# Patient Record
Sex: Female | Born: 1935 | Race: White | Hispanic: No | Smoking: Never smoker
Health system: Southern US, Community
[De-identification: ages and names within clinical notes are randomized; demographics above are authoritative.]

## PROBLEM LIST (undated history)

## (undated) DIAGNOSIS — N189 Chronic kidney disease, unspecified: Secondary | ICD-10-CM

## (undated) DIAGNOSIS — D649 Anemia, unspecified: Secondary | ICD-10-CM

## (undated) DIAGNOSIS — E739 Lactose intolerance, unspecified: Secondary | ICD-10-CM

## (undated) DIAGNOSIS — M25519 Pain in unspecified shoulder: Secondary | ICD-10-CM

## (undated) DIAGNOSIS — R609 Edema, unspecified: Secondary | ICD-10-CM

## (undated) DIAGNOSIS — I10 Essential (primary) hypertension: Secondary | ICD-10-CM

## (undated) DIAGNOSIS — I209 Angina pectoris, unspecified: Secondary | ICD-10-CM

## (undated) DIAGNOSIS — D229 Melanocytic nevi, unspecified: Secondary | ICD-10-CM

## (undated) DIAGNOSIS — N649 Disorder of breast, unspecified: Secondary | ICD-10-CM

## (undated) DIAGNOSIS — J069 Acute upper respiratory infection, unspecified: Secondary | ICD-10-CM

## (undated) DIAGNOSIS — K635 Polyp of colon: Secondary | ICD-10-CM

## (undated) DIAGNOSIS — G4730 Sleep apnea, unspecified: Secondary | ICD-10-CM

## (undated) DIAGNOSIS — K589 Irritable bowel syndrome without diarrhea: Secondary | ICD-10-CM

## (undated) DIAGNOSIS — E039 Hypothyroidism, unspecified: Secondary | ICD-10-CM

## (undated) DIAGNOSIS — F32A Depression, unspecified: Secondary | ICD-10-CM

## (undated) DIAGNOSIS — F329 Major depressive disorder, single episode, unspecified: Secondary | ICD-10-CM

## (undated) DIAGNOSIS — T7840XA Allergy, unspecified, initial encounter: Secondary | ICD-10-CM

## (undated) DIAGNOSIS — E7800 Pure hypercholesterolemia, unspecified: Secondary | ICD-10-CM

## (undated) DIAGNOSIS — G3184 Mild cognitive impairment, so stated: Secondary | ICD-10-CM

## (undated) DIAGNOSIS — IMO0001 Reserved for inherently not codable concepts without codable children: Secondary | ICD-10-CM

## (undated) DIAGNOSIS — H3530 Unspecified macular degeneration: Secondary | ICD-10-CM

## (undated) DIAGNOSIS — F419 Anxiety disorder, unspecified: Secondary | ICD-10-CM

## (undated) DIAGNOSIS — H9190 Unspecified hearing loss, unspecified ear: Secondary | ICD-10-CM

## (undated) DIAGNOSIS — M1990 Unspecified osteoarthritis, unspecified site: Secondary | ICD-10-CM

## (undated) DIAGNOSIS — E079 Disorder of thyroid, unspecified: Secondary | ICD-10-CM

## (undated) DIAGNOSIS — K219 Gastro-esophageal reflux disease without esophagitis: Secondary | ICD-10-CM

## (undated) DIAGNOSIS — N63 Unspecified lump in unspecified breast: Secondary | ICD-10-CM

## (undated) DIAGNOSIS — L988 Other specified disorders of the skin and subcutaneous tissue: Secondary | ICD-10-CM

## (undated) DIAGNOSIS — K5792 Diverticulitis of intestine, part unspecified, without perforation or abscess without bleeding: Secondary | ICD-10-CM

## (undated) HISTORY — DX: Disorder of thyroid, unspecified: E07.9

## (undated) HISTORY — PX: HERNIA REPAIR: SHX51

## (undated) HISTORY — DX: Allergy, unspecified, initial encounter: T78.40XA

## (undated) HISTORY — DX: Unspecified macular degeneration: H35.30

## (undated) HISTORY — DX: Diverticulitis of intestine, part unspecified, without perforation or abscess without bleeding: K57.92

## (undated) HISTORY — DX: Melanocytic nevi, unspecified: D22.9

## (undated) HISTORY — PX: CHOLECYSTECTOMY: SHX55

## (undated) HISTORY — DX: Pure hypercholesterolemia, unspecified: E78.00

## (undated) HISTORY — DX: Irritable bowel syndrome, unspecified: K58.9

## (undated) HISTORY — DX: Polyp of colon: K63.5

## (undated) HISTORY — PX: BREAST SURGERY: SHX581

## (undated) HISTORY — DX: Unspecified lump in unspecified breast: N63.0

## (undated) HISTORY — PX: EYE SURGERY: SHX253

## (undated) HISTORY — PX: CATARACT EXTRACTION, BILATERAL: SHX1313

## (undated) HISTORY — PX: COLONOSCOPY: SHX174

## (undated) HISTORY — PX: ESOPHAGOGASTRODUODENOSCOPY: SHX1529

## (undated) HISTORY — PX: TUBAL LIGATION: SHX77

## (undated) HISTORY — DX: Lactose intolerance, unspecified: E73.9

---

## 1970-08-02 HISTORY — PX: TUBAL LIGATION: SHX77

## 2000-08-02 DIAGNOSIS — G3184 Mild cognitive impairment, so stated: Secondary | ICD-10-CM

## 2000-08-02 HISTORY — DX: Mild cognitive impairment of uncertain or unknown etiology: G31.84

## 2011-03-19 ENCOUNTER — Encounter (INDEPENDENT_AMBULATORY_CARE_PROVIDER_SITE_OTHER): Payer: Self-pay | Admitting: General Surgery

## 2011-03-19 ENCOUNTER — Ambulatory Visit (INDEPENDENT_AMBULATORY_CARE_PROVIDER_SITE_OTHER): Payer: 59 | Admitting: General Surgery

## 2011-03-19 VITALS — BP 156/76 | HR 80 | Temp 98.0°F | Ht 65.5 in | Wt 176.2 lb

## 2011-03-19 DIAGNOSIS — K5732 Diverticulitis of large intestine without perforation or abscess without bleeding: Secondary | ICD-10-CM

## 2011-03-19 NOTE — Progress Notes (Signed)
Subjective:   Abdominal pain and recurrent diverticulitis  Patient ID: Sophia Nash, female   DOB: 09/28/1934, 76 y.o.   MRN: [id number]  HPI The patient is a very pleasant 76 year old female referred through the courtesy of Dr. Murlean Caller at [facility]. She is here for consideration for surgical treatment for persistent and recurrent diverticulitis. She is accompanied today by her daughter and husband. She states she had her initial "attack" in March of this year. She developed sudden fairly severe pain across her lower abdomen. This was treated with outpatient antibiotics although she had quite a bit of pain for several days. Since then she has had 3 further acute episodes of abdominal pain localized in the left lower quadrant. Each time she has received at least 2 weeks of Cipro and Flagyl. She will improve on antibiotics but never feels completely well. She has ongoing fatigue and some malaise as well as vague discomfort and abdominal bloating between episodes. When off antibiotics she has a fairly quick recurrence of her more acute symptoms. She has had 2 separate CT scans which she and her daughter state confirmed sigmoid diverticulitis without apparent abscess or complication. I do not have these films to review today but they will get me the discs to review. She does have some decrease in caliber of stools and some bloating but her bowels are moving regularly her she has no previous history of significant GI disease.She does however have a history of colon polyps and had a colonoscopy in 2010 which was clear. We will obtain these records as well. She denies any current rectal bleeding. No fever or chills. Weight is stable.  Past Medical History  Diagnosis Date  . Breast lump     left  . Thyroid disease     hypothyroidism  . Atypical moles   . Colon polyp   . Allergy    Past Surgical History  Procedure Date  . Tubal ligation   . Breast surgery     lumpectomy- left-benign   . Cholecystectomy     laparoscopic   Current Outpatient Prescriptions  Medication Sig Dispense Refill  . ciprofloxacin (CIPRO) 500 MG tablet BID times 48H.      . citalopram (CELEXA) 40 MG tablet daily.      Marland Kitchen ezetimibe (ZETIA) 10 MG tablet Take 10 mg by mouth daily.        Marland Kitchen LACTOBACILLUS PO Take 100 mg by mouth daily.        Marland Kitchen LEVOXYL 75 MCG tablet daily.      . metroNIDAZOLE (FLAGYL) 500 MG tablet Three times a day.      . Multiple Vitamin (MULTIVITAMIN) capsule Take 1 capsule by mouth daily.        Marland Kitchen triamterene-hydrochlorothiazide (DYAZIDE) 37.5-25 MG per capsule Take 1 capsule by mouth daily.         Allergies  Allergen Reactions  . Penicillins Anaphylaxis  . Aspirin     Burns stomach  . Codeine     Makes pt overly excited.... Anxious/ hyperactive    History  Substance Use Topics  . Smoking status: Never Smoker   . Smokeless tobacco: Not on file  . Alcohol Use: No     Review of Systems  Constitutional: Negative for fever, chills and activity change.  HENT: Negative.   Eyes: Negative.   Respiratory: Positive for shortness of breath. Negative for cough, wheezing and stridor.   Cardiovascular: Negative.   Gastrointestinal: Positive for abdominal pain. Negative for vomiting,  constipation and blood in stool.  Genitourinary: Negative.   Musculoskeletal: Positive for arthralgias.  Skin: Negative.   Hematological: Negative.        Objective:   Physical Exam General: Mildly overweight alert elderly white female in no distress Skin: Warm and dry without rash or infection HEENT: No palpable masses or thyromegaly. Oropharynx clear. Sclera nonicteric. Lymph nodes: No cervical, supraclavicular, or inguinal nodes palpable Lungs: Clear without wheezing or increased work of breathing Cardiovascular: Regular rate and rhythm without murmur. No JVD or edema. Abdomen: Nondistended. Soft with a mild left lower quadrant tenderness without guarding. No discernible masses or  organomegaly. Extremities: No joint swelling or deformity Neurologic: Alert and oriented. Gait normal.    Assessment:     76 year old female with recurrent and essentially persistent episodes of sigmoid diverticulitis since March of this year. She has had prompt recurrence when she is off antibiotics. I think it is very unlikely that this is going to resolve completely and I think she is certainly at risk for complications going forward. I think that he elective colectomy would be her safest course.    Plan:     We discussed proceeding with laparoscopic sigmoid colectomy. We discussed the indications for the procedure and alternatives i.e. antibiotic management. The nature of the surgery, expected recovery, possible need for open surgery, and risks of anesthetic complications, bleeding, infection, anastomotic leak, and possible need for diverting ostomy were discussed and understood. I'm going to obtain copies of her CT scan discs and review these prior to surgery to confirm the above findings. In the meantime we can be moving forward arranging her surgery. I asked her to call if she has any severe recurrent symptoms. She will complete her current 3 week course of antibiotics.

## 2011-03-19 NOTE — Patient Instructions (Signed)
Call for any worsening symptoms or questions. We will contact you regarding scheduling of her surgery. Please call if he has not heard anything by the end of next week. Please obtain copies of your CT scan on disc for review.

## 2011-03-23 ENCOUNTER — Telehealth (INDEPENDENT_AMBULATORY_CARE_PROVIDER_SITE_OTHER): Payer: Self-pay | Admitting: General Surgery

## 2011-03-23 NOTE — Telephone Encounter (Signed)
Patient daughter Sophia Nash called requesting that Ms. Lafosse surgery to be at a different facility, please call Sophia Nash @ [phone].

## 2011-04-28 ENCOUNTER — Inpatient Hospital Stay (HOSPITAL_COMMUNITY): Admission: RE | Admit: 2011-04-28 | Payer: Medicare Other | Source: Ambulatory Visit | Admitting: General Surgery

## 2011-09-06 ENCOUNTER — Telehealth (INDEPENDENT_AMBULATORY_CARE_PROVIDER_SITE_OTHER): Payer: Self-pay

## 2011-09-06 NOTE — Telephone Encounter (Signed)
Pt's daughter called.  Pt is now very symptomatic(was last seen in August 2012) and her daughter would like her seen ASAP by Dr. Johna Sheriff.  Please call her tomorrow before 10:00am if possible.  She is a [employer] and is usually busy most of the day.  Her contact number is [phone].

## 2011-09-16 ENCOUNTER — Telehealth (INDEPENDENT_AMBULATORY_CARE_PROVIDER_SITE_OTHER): Payer: Self-pay

## 2011-09-16 NOTE — Telephone Encounter (Signed)
Called and spoke with Sophia Nash & Sophia Nash Section (daughter) regarding appointment w/Dr. Johna Sheriff.  Offered to work patient in on 09/23/11 but unable to come that day due to other physician appointments.  Sophia Nash is scheduled to see Dr. Johna Sheriff 09/30/11 @ 10:00 am will arrive 30 minutes prior to appointment.

## 2011-09-23 ENCOUNTER — Ambulatory Visit (INDEPENDENT_AMBULATORY_CARE_PROVIDER_SITE_OTHER): Payer: Medicare Other | Admitting: General Surgery

## 2011-09-23 ENCOUNTER — Encounter (INDEPENDENT_AMBULATORY_CARE_PROVIDER_SITE_OTHER): Payer: Self-pay | Admitting: General Surgery

## 2011-09-23 VITALS — BP 150/78 | HR 72 | Temp 97.9°F | Resp 18 | Ht 65.5 in | Wt 182.2 lb

## 2011-09-23 DIAGNOSIS — K5732 Diverticulitis of large intestine without perforation or abscess without bleeding: Secondary | ICD-10-CM | POA: Insufficient documentation

## 2011-09-23 NOTE — Progress Notes (Signed)
Subjective:   Followup diverticulitis.  Patient ID: Sophia Nash, female   DOB: 05/23/1935, 76 y.o.   MRN: [id number]  HPI Patient returns for followup of her sigmoid diverticulitis. I had seen her last summer after several repeated episodes of sigmoid diverticulitis confirmed by CT scan. She has a history of a colonoscopy in 2010 and polypectomy. We were considering surgery at that time that she decided to wait and see how she was doing. Since that time she has had 3 further episodes of clinical diverticulitis with lower abdominal pain in the left lower quadrant. She has been on courses of antibiotics. The last episode was last month and she completed a 3 week course of antibiotics recently. She continues to have some mild left lower quadrant discomfort. Bowel movements had been okay. No fever or chills. She does however feel generally run down and is tired of continually taking antibiotics.  Past Medical History  Diagnosis Date  . Breast lump     left  . Thyroid disease     hypothyroidism  . Atypical moles   . Colon polyp   . Allergy   . Diverticulitis    Past Surgical History  Procedure Date  . Tubal ligation   . Breast surgery     lumpectomy- left-benign  . Cholecystectomy     laparoscopic   Current Outpatient Prescriptions  Medication Sig Dispense Refill  . Probiotic Product (PROBIOTIC PO) Take by mouth daily.      . ciprofloxacin (CIPRO) 500 MG tablet BID times 48H.      . citalopram (CELEXA) 40 MG tablet daily.      . LACTOBACILLUS PO Take 100 mg by mouth daily.        . LEVOXYL 75 MCG tablet daily.      . metroNIDAZOLE (FLAGYL) 500 MG tablet Three times a day.      . Multiple Vitamin (MULTIVITAMIN) capsule Take 1 capsule by mouth daily.        . triamterene-hydrochlorothiazide (DYAZIDE) 37.5-25 MG per capsule Take 1 capsule by mouth daily.         Allergies  Allergen Reactions  . Penicillins Anaphylaxis  . Aspirin     Burns stomach  . Codeine     Makes pt overly  excited.... Anxious/ hyperactive    History  Substance Use Topics  . Smoking status: Never Smoker   . Smokeless tobacco: Never Used  . Alcohol Use: No   .  Review of Systems  Constitutional: Positive for fatigue. Negative for fever and chills.  Respiratory: Positive for cough. Negative for shortness of breath and wheezing.   Cardiovascular: Negative.   Gastrointestinal: Positive for abdominal pain and abdominal distention. Negative for nausea, vomiting, diarrhea, constipation, blood in stool and anal bleeding.  Genitourinary: Negative.        Objective:   Physical Exam General: Alert,Mildly overweight Caucasian female  , in no distress Skin: Warm and dry without rash or infection. Multiple seborrheic keratoses HEENT: No palpable masses or thyromegaly. Sclera nonicteric. Pupils equal round and reactive. Oropharynx clear. Lymph nodes: No cervical, supraclavicular, or inguinal nodes palpable. Lungs: Breath sounds clear and equal without increased work of breathing Cardiovascular: Regular rate and rhythm without murmur. No JVD or edema. Peripheral pulses intact. Abdomen: Nondistended. Soft with minimal left lower quadrant tenderness without guarding.. No masses palpable. No organomegaly. No palpable hernias. Extremities: No edema or joint swelling or deformity. No chronic venous stasis changes. Neurologic: Alert and fully oriented. Gait normal.       Assessment:    Persistent and recurrent sigmoid diverticulitis with multiple episodes over the past year and decreasing time between episodes. I believe she would be best served with elective colectomy in order to relieve her symptoms and avoid likely complications such as stricture or perforation. She and her family are in complete agreement. We again reviewed the surgery and risks as we have at her previous visit.    Plan:     Laparoscopic and possible open sigmoid colectomy.      

## 2011-09-23 NOTE — Progress Notes (Signed)
Addended by: Glenna Fellows T on: 09/23/2011 01:27 PM   Modules accepted: Orders

## 2011-09-27 ENCOUNTER — Telehealth (INDEPENDENT_AMBULATORY_CARE_PROVIDER_SITE_OTHER): Payer: Self-pay

## 2011-09-27 NOTE — Telephone Encounter (Signed)
Request for medical records faxed to [facility] [phone], [facility] [phone] and [facility] [phone]

## 2011-09-28 ENCOUNTER — Other Ambulatory Visit (INDEPENDENT_AMBULATORY_CARE_PROVIDER_SITE_OTHER): Payer: Self-pay | Admitting: General Surgery

## 2011-09-30 ENCOUNTER — Encounter (INDEPENDENT_AMBULATORY_CARE_PROVIDER_SITE_OTHER): Payer: Self-pay | Admitting: General Surgery

## 2011-09-30 ENCOUNTER — Encounter (INDEPENDENT_AMBULATORY_CARE_PROVIDER_SITE_OTHER): Payer: Self-pay

## 2011-10-12 ENCOUNTER — Encounter (HOSPITAL_COMMUNITY): Payer: Self-pay | Admitting: Pharmacy Technician

## 2011-10-15 ENCOUNTER — Ambulatory Visit (HOSPITAL_COMMUNITY): Payer: Medicare Other

## 2011-10-15 ENCOUNTER — Encounter (HOSPITAL_COMMUNITY): Payer: Self-pay

## 2011-10-15 ENCOUNTER — Encounter (HOSPITAL_COMMUNITY)
Admission: RE | Admit: 2011-10-15 | Discharge: 2011-10-15 | Disposition: A | Discharge status: Home / Self Care | Payer: Medicare Other | Source: Ambulatory Visit | Attending: General Surgery | Admitting: General Surgery

## 2011-10-15 ENCOUNTER — Ambulatory Visit (INDEPENDENT_AMBULATORY_CARE_PROVIDER_SITE_OTHER): Payer: Medicare Other | Admitting: General Surgery

## 2011-10-15 ENCOUNTER — Encounter (INDEPENDENT_AMBULATORY_CARE_PROVIDER_SITE_OTHER): Payer: Medicare Other | Admitting: General Surgery

## 2011-10-15 ENCOUNTER — Other Ambulatory Visit: Payer: Self-pay

## 2011-10-15 VITALS — BP 132/78 | HR 70 | Temp 98.4°F | Resp 16 | Wt 181.6 lb

## 2011-10-15 DIAGNOSIS — K5732 Diverticulitis of large intestine without perforation or abscess without bleeding: Secondary | ICD-10-CM

## 2011-10-15 HISTORY — DX: Gastro-esophageal reflux disease without esophagitis: K21.9

## 2011-10-15 HISTORY — DX: Depression, unspecified: F32.A

## 2011-10-15 HISTORY — DX: Unspecified osteoarthritis, unspecified site: M19.90

## 2011-10-15 HISTORY — DX: Acute upper respiratory infection, unspecified: J06.9

## 2011-10-15 HISTORY — DX: Edema, unspecified: R60.9

## 2011-10-15 HISTORY — DX: Mild cognitive impairment, so stated: G31.84

## 2011-10-15 HISTORY — DX: Major depressive disorder, single episode, unspecified: F32.9

## 2011-10-15 LAB — CBC
Hemoglobin: 14.6 g/dL (ref 12.0–15.0)
MCH: 31 pg (ref 26.0–34.0)
RBC: 4.71 MIL/uL (ref 3.87–5.11)

## 2011-10-15 LAB — BASIC METABOLIC PANEL
CO2: 27 mEq/L (ref 19–32)
Calcium: 9.5 mg/dL (ref 8.4–10.5)
Glucose, Bld: 97 mg/dL (ref 70–99)
Potassium: 3.8 mEq/L (ref 3.5–5.1)
Sodium: 136 mEq/L (ref 135–145)

## 2011-10-15 LAB — SURGICAL PCR SCREEN: MRSA, PCR: NEGATIVE

## 2011-10-15 NOTE — Progress Notes (Signed)
Vital signs taken at PST visit 1445

## 2011-10-15 NOTE — Patient Instructions (Signed)
20 TAMASHA LAPLANTE  10/15/2011   Your procedure is scheduled on:  10/20/11    Wednesday  Surgery 1230-1530  Report to [facility] at  1000     AM.  Call this number if you have problems the morning of surgery: [phone]     Or PST   [id number]  [facility]   Remember:   Do not eat food:After Midnight.Tuesday night or as directed by office    ? Monday night- has bowel  prep instructions  May have clear liquids: until  MIDNIGHT Tuesday NIGHT        INCREASE FLUIDS  TUESDAY  Clear liquids include soda, tea, black coffee, apple or grape juice, broth.  Take these medicines the morning of surgery with A SIP OF WATER: CELEXA, LEVOXYL STOP aspirin, antiinflammatories of herbal meds 5 days pre op  Do not wear jewelry, make-up or nail polish.  Do not wear lotions, powders, or perfumes. You may wear deodorant.  Do not shave 48 hours prior to surgery.  Do not bring valuables to the hospital.  Contacts, dentures or bridgework may not be worn into surgery.  Leave suitcase in the car. After surgery it may be brought to your room.  For patients admitted to the hospital, checkout time is 11:00 AM the day of discharge.   Patients discharged the day of surgery will not be allowed to drive home.  Name and phone number of your driver:     husband                                                                 Special Instructions: CHG Shower Use Special Wash: 1/2 bottle night before surgery and 1/2 bottle morning of surgery. REGULAR SOAP FACE AND PRIVATES              LADIES- NO SHAVING 48 HOURS BEFORE USING BETASEPT SOAP.                  Please read over the following fact sheets that you were given: MRSA Information

## 2011-10-15 NOTE — Progress Notes (Signed)
History: The patient returns for a preop check prior to planned laparoscopic sigmoid colectomy for chronic and recurrent diverticulitis. She actually had another flareup about a week ago with some persistent left lower quadrant pain. She is on oral antibiotics and not feeling a lot better but not feeling worse. No fever.  Exam: BP 132/78  Pulse 70  Temp(Src) 98.4 F (36.9 C) (Temporal)  Resp 16  Wt 181 lb 9.6 oz (82.373 kg)  Gen.: She does not appear ill. Abdomen: Is soft with minimal if any left lower quadrant tenderness and no palpable masses.  Assessment and plan: Multiply recurrent and persistent sigmoid diverticulitis. All her questions regarding the surgery were answered. Plan to proceed with laparoscopic sigmoid colectomy. She'll continue oral antibiotics until the time of surgery there

## 2011-10-20 ENCOUNTER — Encounter (HOSPITAL_COMMUNITY): Payer: Self-pay | Admitting: *Deleted

## 2011-10-20 ENCOUNTER — Encounter (HOSPITAL_COMMUNITY)
Admission: RE | Disposition: A | Discharge status: Home / Self Care | Payer: Self-pay | Source: Ambulatory Visit | Attending: General Surgery

## 2011-10-20 ENCOUNTER — Ambulatory Visit (HOSPITAL_COMMUNITY): Payer: Medicare Other | Admitting: Certified Registered Nurse Anesthetist

## 2011-10-20 ENCOUNTER — Encounter (HOSPITAL_COMMUNITY): Payer: Self-pay | Admitting: Certified Registered Nurse Anesthetist

## 2011-10-20 ENCOUNTER — Inpatient Hospital Stay (HOSPITAL_COMMUNITY)
Admission: RE | Admit: 2011-10-20 | Discharge: 2011-10-23 | DRG: 331 | Disposition: A | Discharge status: Home / Self Care | Payer: Medicare Other | Source: Ambulatory Visit | Attending: General Surgery | Admitting: General Surgery

## 2011-10-20 DIAGNOSIS — K5732 Diverticulitis of large intestine without perforation or abscess without bleeding: Secondary | ICD-10-CM

## 2011-10-20 DIAGNOSIS — E039 Hypothyroidism, unspecified: Secondary | ICD-10-CM | POA: Diagnosis present

## 2011-10-20 DIAGNOSIS — Z860100 Personal history of colon polyps, unspecified: Secondary | ICD-10-CM

## 2011-10-20 DIAGNOSIS — Z01812 Encounter for preprocedural laboratory examination: Secondary | ICD-10-CM

## 2011-10-20 HISTORY — PX: COLECTOMY: SHX59

## 2011-10-20 LAB — ABO/RH: ABO/RH(D): B NEG

## 2011-10-20 LAB — TYPE AND SCREEN: Antibody Screen: NEGATIVE

## 2011-10-20 SURGERY — COLECTOMY, SIGMOID, LAPAROSCOPIC
Anesthesia: General | Site: Abdomen | Wound class: Clean Contaminated

## 2011-10-20 MED ORDER — HEPARIN SODIUM (PORCINE) 5000 UNIT/ML IJ SOLN
5000.0000 [IU] | Freq: Three times a day (TID) | INTRAMUSCULAR | Status: DC
  Administered 2011-10-21 – 2011-10-23 (×6): 5000 [IU] via SUBCUTANEOUS
  Filled 2011-10-20 (×9): qty 1

## 2011-10-20 MED ORDER — HYDROMORPHONE HCL PF 1 MG/ML IJ SOLN: 0.2500 mg | INTRAMUSCULAR | Status: DC | PRN

## 2011-10-20 MED ORDER — BIOTENE DRY MOUTH MT LIQD
15.0000 mL | Freq: Two times a day (BID) | OROMUCOSAL | Status: DC
  Administered 2011-10-21 – 2011-10-22 (×3): 15 mL via OROMUCOSAL

## 2011-10-20 MED ORDER — CIPROFLOXACIN IN D5W 400 MG/200ML IV SOLN
400.0000 mg | INTRAVENOUS | Status: AC
  Administered 2011-10-20: 400 mg via INTRAVENOUS

## 2011-10-20 MED ORDER — FENTANYL CITRATE 0.05 MG/ML IJ SOLN
INTRAMUSCULAR | Status: DC | PRN
  Administered 2011-10-20 (×2): 25 ug via INTRAVENOUS
  Administered 2011-10-20: 100 ug via INTRAVENOUS
  Administered 2011-10-20: 50 ug via INTRAVENOUS
  Administered 2011-10-20 (×2): 25 ug via INTRAVENOUS
  Administered 2011-10-20: 100 ug via INTRAVENOUS

## 2011-10-20 MED ORDER — CIPROFLOXACIN IN D5W 400 MG/200ML IV SOLN
INTRAVENOUS | Status: AC
  Filled 2011-10-20: qty 200

## 2011-10-20 MED ORDER — ACETAMINOPHEN 10 MG/ML IV SOLN
INTRAVENOUS | Status: AC
  Filled 2011-10-20: qty 100

## 2011-10-20 MED ORDER — LACTATED RINGERS IV SOLN
INTRAVENOUS | Status: DC
  Administered 2011-10-20: 1000 mL via INTRAVENOUS

## 2011-10-20 MED ORDER — LACTATED RINGERS IV SOLN: INTRAVENOUS | Status: DC

## 2011-10-20 MED ORDER — SUCCINYLCHOLINE CHLORIDE 20 MG/ML IJ SOLN
INTRAMUSCULAR | Status: DC | PRN
  Administered 2011-10-20: 100 mg via INTRAVENOUS

## 2011-10-20 MED ORDER — LACTATED RINGERS IR SOLN
Status: DC | PRN
  Administered 2011-10-20: 1

## 2011-10-20 MED ORDER — ACETAMINOPHEN 10 MG/ML IV SOLN
INTRAVENOUS | Status: DC | PRN
  Administered 2011-10-20: 1000 mg via INTRAVENOUS

## 2011-10-20 MED ORDER — MIDAZOLAM HCL 5 MG/5ML IJ SOLN
INTRAMUSCULAR | Status: DC | PRN
  Administered 2011-10-20: 2 mg via INTRAVENOUS

## 2011-10-20 MED ORDER — ALVIMOPAN 12 MG PO CAPS
12.0000 mg | ORAL_CAPSULE | Freq: Two times a day (BID) | ORAL | Status: DC
Start: 2011-10-21 — End: 2011-10-23
  Administered 2011-10-21 – 2011-10-23 (×5): 12 mg via ORAL
  Filled 2011-10-20 (×6): qty 1

## 2011-10-20 MED ORDER — KCL IN DEXTROSE-NACL 20-5-0.45 MEQ/L-%-% IV SOLN
INTRAVENOUS | Status: DC
  Administered 2011-10-20 – 2011-10-22 (×2): via INTRAVENOUS
  Filled 2011-10-20 (×8): qty 1000

## 2011-10-20 MED ORDER — CITALOPRAM HYDROBROMIDE 20 MG PO TABS
20.0000 mg | ORAL_TABLET | Freq: Two times a day (BID) | ORAL | Status: DC
  Administered 2011-10-20 – 2011-10-23 (×6): 20 mg via ORAL
  Filled 2011-10-20 (×7): qty 1

## 2011-10-20 MED ORDER — SODIUM CHLORIDE 0.9 % IJ SOLN
INTRAMUSCULAR | Status: DC | PRN
  Administered 2011-10-20: 20 mL via INTRAVENOUS

## 2011-10-20 MED ORDER — EPHEDRINE SULFATE 50 MG/ML IJ SOLN
INTRAMUSCULAR | Status: DC | PRN
  Administered 2011-10-20 (×2): 5 mg via INTRAVENOUS
  Administered 2011-10-20: 10 mg via INTRAVENOUS
  Administered 2011-10-20: 5 mg via INTRAVENOUS
  Administered 2011-10-20: 10 mg via INTRAVENOUS

## 2011-10-20 MED ORDER — LIDOCAINE HCL 1 % IJ SOLN
INTRAMUSCULAR | Status: DC | PRN
  Administered 2011-10-20: 80 mg via INTRADERMAL

## 2011-10-20 MED ORDER — KETAMINE HCL 10 MG/ML IJ SOLN
INTRAMUSCULAR | Status: DC | PRN
  Administered 2011-10-20 (×2): 5 mg via INTRAVENOUS

## 2011-10-20 MED ORDER — ONDANSETRON HCL 4 MG/2ML IJ SOLN: 4.0000 mg | Freq: Four times a day (QID) | INTRAMUSCULAR | Status: DC | PRN

## 2011-10-20 MED ORDER — CHLORHEXIDINE GLUCONATE 0.12 % MT SOLN
15.0000 mL | Freq: Two times a day (BID) | OROMUCOSAL | Status: DC
  Administered 2011-10-21 – 2011-10-22 (×3): 15 mL via OROMUCOSAL
  Filled 2011-10-20 (×7): qty 15

## 2011-10-20 MED ORDER — CISATRACURIUM BESYLATE 2 MG/ML IV SOLN
INTRAVENOUS | Status: DC | PRN
  Administered 2011-10-20: 1 mg via INTRAVENOUS
  Administered 2011-10-20: 10 mg via INTRAVENOUS
  Administered 2011-10-20: 6 mg via INTRAVENOUS
  Administered 2011-10-20: 1 mg via INTRAVENOUS
  Administered 2011-10-20: 4 mg via INTRAVENOUS

## 2011-10-20 MED ORDER — METRONIDAZOLE IN NACL 5-0.79 MG/ML-% IV SOLN
500.0000 mg | Freq: Once | INTRAVENOUS | Status: AC
  Administered 2011-10-20: 500 mg via INTRAVENOUS

## 2011-10-20 MED ORDER — HYDROMORPHONE HCL PF 1 MG/ML IJ SOLN
INTRAMUSCULAR | Status: DC | PRN
  Administered 2011-10-20 (×2): 0.5 mg via INTRAVENOUS
  Administered 2011-10-20: 1 mg via INTRAVENOUS

## 2011-10-20 MED ORDER — ALVIMOPAN 12 MG PO CAPS
ORAL_CAPSULE | ORAL | Status: AC
  Filled 2011-10-20: qty 1

## 2011-10-20 MED ORDER — METRONIDAZOLE IN NACL 5-0.79 MG/ML-% IV SOLN
INTRAVENOUS | Status: AC
  Filled 2011-10-20: qty 100

## 2011-10-20 MED ORDER — GLYCOPYRROLATE 0.2 MG/ML IJ SOLN
INTRAMUSCULAR | Status: DC | PRN
  Administered 2011-10-20: .4 mg via INTRAVENOUS

## 2011-10-20 MED ORDER — NEOSTIGMINE METHYLSULFATE 1 MG/ML IJ SOLN
INTRAMUSCULAR | Status: DC | PRN
  Administered 2011-10-20: 3 mg via INTRAVENOUS

## 2011-10-20 MED ORDER — ONDANSETRON HCL 4 MG/2ML IJ SOLN
INTRAMUSCULAR | Status: DC | PRN
  Administered 2011-10-20 (×3): 1 mg via INTRAVENOUS

## 2011-10-20 MED ORDER — LACTATED RINGERS IV SOLN
INTRAVENOUS | Status: DC | PRN
  Administered 2011-10-20 (×3): via INTRAVENOUS

## 2011-10-20 MED ORDER — MORPHINE SULFATE 2 MG/ML IJ SOLN
2.0000 mg | INTRAMUSCULAR | Status: DC | PRN
  Administered 2011-10-20 – 2011-10-21 (×5): 2 mg via INTRAVENOUS
  Filled 2011-10-20 (×5): qty 1

## 2011-10-20 MED ORDER — 0.9 % SODIUM CHLORIDE (POUR BTL) OPTIME
TOPICAL | Status: DC | PRN
  Administered 2011-10-20: 1000 mL

## 2011-10-20 MED ORDER — BUPIVACAINE LIPOSOME 1.3 % IJ SUSP
20.0000 mL | INTRAMUSCULAR | Status: AC
  Administered 2011-10-20: 20 mL
  Filled 2011-10-20: qty 20

## 2011-10-20 MED ORDER — OXYCODONE-ACETAMINOPHEN 5-325 MG PO TABS
1.0000 | ORAL_TABLET | ORAL | Status: DC | PRN
  Filled 2011-10-20: qty 1

## 2011-10-20 MED ORDER — ALVIMOPAN 12 MG PO CAPS
12.0000 mg | ORAL_CAPSULE | Freq: Once | ORAL | Status: AC
  Administered 2011-10-20: 12 mg via ORAL

## 2011-10-20 MED ORDER — ONDANSETRON HCL 4 MG PO TABS: 4.0000 mg | ORAL_TABLET | Freq: Four times a day (QID) | ORAL | Status: DC | PRN

## 2011-10-20 MED ORDER — PROPOFOL 10 MG/ML IV BOLUS
INTRAVENOUS | Status: DC | PRN
  Administered 2011-10-20: 20 mg via INTRAVENOUS
  Administered 2011-10-20: 150 mg via INTRAVENOUS

## 2011-10-20 MED ORDER — LEVOTHYROXINE SODIUM 75 MCG PO TABS
75.0000 ug | ORAL_TABLET | Freq: Every day | ORAL | Status: DC
  Administered 2011-10-21 – 2011-10-23 (×3): 75 ug via ORAL
  Filled 2011-10-20 (×3): qty 1

## 2011-10-20 SURGICAL SUPPLY — 76 items
APPLIER CLIP 5 13 M/L LIGAMAX5 (MISCELLANEOUS)
APPLIER CLIP ROT 10 11.4 M/L (STAPLE)
BLADE HEX COATED 2.75 (ELECTRODE) ×2 IMPLANT
BLADE SURG SZ10 CARB STEEL (BLADE) ×2 IMPLANT
CELLS DAT CNTRL 66122 CELL SVR (MISCELLANEOUS) ×1 IMPLANT
CHLORAPREP W/TINT 26ML (MISCELLANEOUS) ×2 IMPLANT
CLIP APPLIE 5 13 M/L LIGAMAX5 (MISCELLANEOUS) IMPLANT
CLIP APPLIE ROT 10 11.4 M/L (STAPLE) IMPLANT
CLOTH BEACON ORANGE TIMEOUT ST (SAFETY) ×2 IMPLANT
COVER MAYO STAND STRL (DRAPES) ×2 IMPLANT
COVER SURGICAL LIGHT HANDLE (MISCELLANEOUS) ×2 IMPLANT
CUTTER FLEX LINEAR 45M (STAPLE) ×2 IMPLANT
DERMABOND ADVANCED (GAUZE/BANDAGES/DRESSINGS) ×1
DERMABOND ADVANCED .7 DNX12 (GAUZE/BANDAGES/DRESSINGS) ×1 IMPLANT
DRAPE CAMERA CLOSED 9X96 (DRAPES) IMPLANT
DRAPE LAPAROSCOPIC ABDOMINAL (DRAPES) ×2 IMPLANT
DRAPE LG THREE QUARTER DISP (DRAPES) ×2 IMPLANT
DRAPE WARM FLUID 44X44 (DRAPE) ×2 IMPLANT
ELECT REM PT RETURN 9FT ADLT (ELECTROSURGICAL) ×2
ELECTRODE REM PT RTRN 9FT ADLT (ELECTROSURGICAL) ×1 IMPLANT
ENDOLOOP SUT PDS II  0 18 (SUTURE)
ENDOLOOP SUT PDS II 0 18 (SUTURE) IMPLANT
FILTER SMOKE EVAC LAPAROSHD (FILTER) ×2 IMPLANT
GOWN STRL NON-REIN LRG LVL3 (GOWN DISPOSABLE) ×4 IMPLANT
GOWN STRL REIN XL XLG (GOWN DISPOSABLE) ×4 IMPLANT
KIT BASIN OR (CUSTOM PROCEDURE TRAY) ×2 IMPLANT
LEGGING LITHOTOMY PAIR STRL (DRAPES) ×2 IMPLANT
LIGASURE IMPACT 36 18CM CVD LR (INSTRUMENTS) IMPLANT
NS IRRIG 1000ML POUR BTL (IV SOLUTION) ×4 IMPLANT
PEN SKIN MARKING BROAD (MISCELLANEOUS) ×2 IMPLANT
PENCIL BUTTON HOLSTER BLD 10FT (ELECTRODE) ×2 IMPLANT
RELOAD 45 VASCULAR/THIN (ENDOMECHANICALS) ×4 IMPLANT
RELOAD STAPLE TA45 3.5 REG BLU (ENDOMECHANICALS) ×6 IMPLANT
RTRCTR WOUND ALEXIS 18CM MED (MISCELLANEOUS) ×2
SCALPEL HARMONIC ACE (MISCELLANEOUS) ×2 IMPLANT
SCISSORS LAP 5X35 DISP (ENDOMECHANICALS) ×2 IMPLANT
SEALER TISSUE G2 CVD JAW 35 (ENDOMECHANICALS) IMPLANT
SEALER TISSUE G2 CVD JAW 45CM (ENDOMECHANICALS)
SET IRRIG TUBING LAPAROSCOPIC (IRRIGATION / IRRIGATOR) ×2 IMPLANT
SLEEVE ADV FIXATION 12X100MM (TROCAR) IMPLANT
SLEEVE ADV FIXATION 5X100MM (TROCAR) ×6 IMPLANT
SLEEVE Z-THREAD 12X100MM (TROCAR) IMPLANT
SOLUTION ANTI FOG 6CC (MISCELLANEOUS) ×2 IMPLANT
SPONGE GAUZE 4X4 12PLY (GAUZE/BANDAGES/DRESSINGS) ×2 IMPLANT
SPONGE LAP 18X18 X RAY DECT (DISPOSABLE) ×2 IMPLANT
STAPLER CIRC CVD 29MM 37CM (STAPLE) ×2 IMPLANT
STAPLER VISISTAT 35W (STAPLE) ×2 IMPLANT
SUCTION POOLE TIP (SUCTIONS) ×2 IMPLANT
SUT MNCRL AB 4-0 PS2 18 (SUTURE) ×4 IMPLANT
SUT PDS AB 0 CT1 36 (SUTURE) ×4 IMPLANT
SUT PDS AB 1 CT1 27 (SUTURE) IMPLANT
SUT PDS AB 1 CTX 36 (SUTURE) IMPLANT
SUT PDS AB 1 TP1 96 (SUTURE) IMPLANT
SUT PROLENE 2 0 KS (SUTURE) ×2 IMPLANT
SUT SILK 2 0 (SUTURE) ×1
SUT SILK 2 0 SH CR/8 (SUTURE) ×2 IMPLANT
SUT SILK 2-0 18XBRD TIE 12 (SUTURE) ×1 IMPLANT
SUT SILK 3 0 (SUTURE) ×1
SUT SILK 3 0 SH CR/8 (SUTURE) ×2 IMPLANT
SUT SILK 3-0 18XBRD TIE 12 (SUTURE) ×1 IMPLANT
SYR BULB IRRIGATION 50ML (SYRINGE) ×2 IMPLANT
SYS LAPSCP GELPORT 120MM (MISCELLANEOUS)
SYSTEM LAPSCP GELPORT 120MM (MISCELLANEOUS) IMPLANT
TOWEL OR 17X26 10 PK STRL BLUE (TOWEL DISPOSABLE) ×4 IMPLANT
TRAY FOLEY CATH 14FRSI W/METER (CATHETERS) ×2 IMPLANT
TRAY LAP CHOLE (CUSTOM PROCEDURE TRAY) ×2 IMPLANT
TROCAR ADV FIXATION 11X100MM (TROCAR) IMPLANT
TROCAR ADV FIXATION 12X100MM (TROCAR) IMPLANT
TROCAR ADV FIXATION 5X100MM (TROCAR) IMPLANT
TROCAR BLADELESS OPT 5 75 (ENDOMECHANICALS) IMPLANT
TROCAR XCEL BLUNT TIP 100MML (ENDOMECHANICALS) ×2 IMPLANT
TROCAR XCEL NON-BLD 11X100MML (ENDOMECHANICALS) IMPLANT
TROCAR Z-THREAD FIOS 12X100MM (TROCAR) ×2 IMPLANT
TUBING INSUFFLATION 10FT LAP (TUBING) ×2 IMPLANT
YANKAUER SUCT BULB TIP 10FT TU (MISCELLANEOUS) ×2 IMPLANT
YANKAUER SUCT BULB TIP NO VENT (SUCTIONS) ×2 IMPLANT

## 2011-10-20 NOTE — Anesthesia Preprocedure Evaluation (Addendum)
Anesthesia Evaluation  Patient identified by MRN, date of birth, ID band Patient awake    Reviewed: Allergy & Precautions, H&P , NPO status , Patient's Chart, lab work & pertinent test results  Airway Mallampati: II TM Distance: >3 FB Neck ROM: full    Dental No notable dental hx. (+) Teeth Intact and Dental Advisory Given   Pulmonary neg pulmonary ROS, Recent URI ,  breath sounds clear to auscultation  Pulmonary exam normal       Cardiovascular Exercise Tolerance: Good negative cardio ROS  Rhythm:regular Rate:Normal     Neuro/Psych Depression negative neurological ROS  negative psych ROS   GI/Hepatic negative GI ROS, Neg liver ROS, GERD-  ,  Endo/Other  negative endocrine ROSHypothyroidism   Renal/GU negative Renal ROS  negative genitourinary   Musculoskeletal   Abdominal   Peds  Hematology negative hematology ROS (+)   Anesthesia Other Findings   Reproductive/Obstetrics negative OB ROS                          Anesthesia Physical Anesthesia Plan  ASA: II  Anesthesia Plan: General   Post-op Pain Management:    Induction: Intravenous  Airway Management Planned: Oral ETT  Additional Equipment:   Intra-op Plan:   Post-operative Plan: Extubation in OR  Informed Consent: I have reviewed the patients History and Physical, chart, labs and discussed the procedure including the risks, benefits and alternatives for the proposed anesthesia with the patient or authorized representative who has indicated his/her understanding and acceptance.   Dental Advisory Given  Plan Discussed with: CRNA and Surgeon  Anesthesia Plan Comments:         Anesthesia Quick Evaluation

## 2011-10-20 NOTE — Transfer of Care (Signed)
Immediate Anesthesia Transfer of Care Note  Patient: Sophia Nash  Procedure(s) Performed: Procedure(s) (LRB): LAPAROSCOPIC SIGMOID COLECTOMY (N/A)  Patient Location: PACU  Anesthesia Type: General  Level of Consciousness: awake and alert   Airway & Oxygen Therapy: Patient Spontanous Breathing and Patient connected to face mask oxygen  Post-op Assessment: Report given to PACU RN, Post -op Vital signs reviewed and stable and Patient moving all extremities  Post vital signs: Reviewed and stable  Complications: No apparent anesthesia complications

## 2011-10-20 NOTE — Op Note (Signed)
Preoperative Diagnosis: diverticulitis  Postoprative Diagnosis: diverticulitis  Procedure: Procedure(s): LAPAROSCOPIC SIGMOID COLECTOMY  With takedown of splenic flexure   Surgeon: Glenna Fellows T   Assistants: Harriette Bouillon M.D.  Anesthesia:  General endotracheal anesthesiaDiagnos  Indications:  Patient is a 77 year old female who several years of gradually worsening episodic sigmoid diverticulitis documented on several studies including CT and colonoscopy. She presents with increasingly severe and frequent episodes were now she has been on antibiotics almost continually in recent months. Due to worsening and persistent symptoms unresponsive to medical management after consultation we have elected to proceed with laparoscopic sigmoid colectomy. We discussed the indications for the procedure, its nature, and risks of anesthetic complications, bleeding, infection, visceral injury, anastomotic leak and possible need for temporary ostomy. Following a mechanical bowel prep at home she is brought to the operating room for this procedure.   Procedure Detail: Patient is brought to the operating room, placed in the supine position on the operating table and general endotracheal anesthesia induced. She had received broad-spectrum preoperative IV antibiotics. PAS were in place. She was carefully positioned in a semi-lithotomy position in yellowfin stirrups and arms tucked carefully padded. Foley catheter was placed. The abdomen was widely sterilely prepped and draped. The perineum was sterilely prepped. Patient timeout was performed the correct procedure verified. Access was obtained with a 1 cm incision just below the umbilicus in the midline with an open Hassan technique through mattress suture of 0 Vicryl without difficulty and pneumoperitoneum established. Under direct vision a 12 mm trocar was placed in the right lower quadrant just medial and above the anterior superior iliac spine and a 5 mm  trochars in a similar position in the left lower quadrant in the right upper abdomen and left upper abdomen. Exploration revealed very significant sigmoid diverticulitis with numerous adhesions between the sigmoid colon and the uterus and left adnexa and pelvic side wall. Loops of small bowel were adherent with filmy adhesions down into the pelvis and the colon and these were all mobilized up out of the pelvis and the patient placed in steep reverse Trendelenburg. Adhesions of the colon to the uterus and left adnexa were carefully taken down. The distal rectosigmoid and rectum appeared normal. The more proximal left colon appeared normal. I initially approached the mesentery of the sigmoid colon medially incising the peritoneum and attempted to develop the retroperitoneal plane from medial to lateral. The mesentery however was quite thick unit at this level and chronically inflamed and I was not comfortable with the approach. I therefore mobilized the sigmoid colon from lateral to medial dividing lateral peritoneal attachments and reflecting the sigmoid colon medially. With careful blunt dissection we were able to clearly identify the ureter throughout its course down over the pelvic brim and back under the sigmoid mesentery. At this point again working medial to lateral I isolated the inferior mesenteric pedicle clearly above the visualized ureter and this was divided with the GIA vascular load stapler. We then pulled the rectosigmoid up out of the pelvis and identified a point for distal resection at just about the peritoneal reflection where the bowel was soft and normal and no tinea. The peritoneum on either side of the rectosigmoid was incised and with careful blunt dissection a window was developed along the bowel wall posteriorly and through to the right side. This window was further developed and then the rectosigmoid was divided with 2 firings of the Endo GIA 45 mm blue load stapler. The distal end of the  specimen was  then elevated and retracted cephalad and the mesentery of the distal sigmoid was sequentially divided with the harmonic scalpel again having the ureter visualized and protected throughout. We continued this mesenteric dissection proximally until I entered the window created from division of the inferior mesenteric pedicle. We then extensively mobilized the left colon dividing lateral peritoneal attachments and reflecting the colon medially. The patient placed in reverse Trendelenburg the omentum was dissected off the distal transverse colon and the splenocolic ligament identified and divided with the harmonic scalpel and the splenic flexure completely mobilized dissecting the mesentery of both introitus fascia and then we came back down the left colon further mobilizing it medially. After full mobilization we made a small about 5 cm incision in the left lower quadrant at the position of the previously placed port and a muscle-splitting incision was used and the peritoneal cavity entered. End of the specimen was brought out with plenty of length and we selected an area of the left colon which was soft and without muscular thickening and reach down easily to the pubis. This area was cleaned of mesentery divided between clamps and tie with 2 silk ties and the bowel wall cleared. A 2-0 Prolene pursestring suture was placed with the pursestring clamp and the bowel divided and the specimen removed. The end of the bowel was sized to a 29 mm stapler and the anvil was placed in the end of the pursestring suture secured. Grasping the post of the anvil through one of the 5 mm lateral trochars it was reintroduced into the peritoneal cavity and the wound protector sealed and pneumoperitoneum reestablished. Dr. Luisa Hart then went below and was able to pass the 29 mm EEA stapler transanally up to the stapled end of the rectosigmoid without difficulty. The spike was deployed just anterior to the middle of the staple  line and the anvil attached. Excluding any extraneous tissue the stapler was closed and then fired and withdrawn without difficulty. Both tissue donuts were intact. Occluding the colon above the anastomosis but Cornett performed rigid sigmoidoscopy and with the bowel densely insufflated under saline irrigation there was no evidence of leak. The abdomen was thoroughly irrigated and there was no evidence of bleeding or visceral injury or other problems. All CO2 was evacuated and trochars removed. The mattress suture with secured at the umbilicus. The left lower quadrant incision was closed in 2 layers with running 0 PDS. The fashion soft tissue was infiltrated with Exparel local anesthetic. Wounds were irrigated and skin closed with subcuticular Monocryl and Dermabond. Sponge needle instrument counts were correct.   Findings:  severe sigmoid diverticulitis, chronic   Estimated Blood Loss:  200 mL         Drains: none  Blood Given: none          Specimens: sigmoid colon        Complications:  * No complications entered in OR log *         Disposition: PACU - hemodynamically stable.         Condition: stable  Mariella Saa MD, FACS  10/20/2011, 4:58 PM

## 2011-10-20 NOTE — Anesthesia Postprocedure Evaluation (Signed)
  Anesthesia Post-op Note  Patient: Sophia Nash  Procedure(s) Performed: Procedure(s) (LRB): LAPAROSCOPIC SIGMOID COLECTOMY (N/A)  Patient Location: PACU  Anesthesia Type: General  Level of Consciousness: awake and alert   Airway and Oxygen Therapy: Patient Spontanous Breathing  Post-op Pain: mild  Post-op Assessment: Post-op Vital signs reviewed, Patient's Cardiovascular Status Stable, Respiratory Function Stable, Patent Airway and No signs of Nausea or vomiting  Post-op Vital Signs: stable  Complications: No apparent anesthesia complications

## 2011-10-20 NOTE — H&P (View-Only) (Signed)
Subjective:   Followup diverticulitis.  Patient ID: Sophia Nash, female   DOB: 18-Jan-1935, 77 y.o.   MRN: [id number]  HPI Patient returns for followup of her sigmoid diverticulitis. I had seen her last summer after several repeated episodes of sigmoid diverticulitis confirmed by CT scan. She has a history of a colonoscopy in 2010 and polypectomy. We were considering surgery at that time that she decided to wait and see how she was doing. Since that time she has had 3 further episodes of clinical diverticulitis with lower abdominal pain in the left lower quadrant. She has been on courses of antibiotics. The last episode was last month and she completed a 3 week course of antibiotics recently. She continues to have some mild left lower quadrant discomfort. Bowel movements had been okay. No fever or chills. She does however feel generally run down and is tired of continually taking antibiotics.  Past Medical History  Diagnosis Date  . Breast lump     left  . Thyroid disease     hypothyroidism  . Atypical moles   . Colon polyp   . Allergy   . Diverticulitis    Past Surgical History  Procedure Date  . Tubal ligation   . Breast surgery     lumpectomy- left-benign  . Cholecystectomy     laparoscopic   Current Outpatient Prescriptions  Medication Sig Dispense Refill  . Probiotic Product (PROBIOTIC PO) Take by mouth daily.      . ciprofloxacin (CIPRO) 500 MG tablet BID times 48H.      . citalopram (CELEXA) 40 MG tablet daily.      Marland Kitchen LACTOBACILLUS PO Take 100 mg by mouth daily.        Marland Kitchen LEVOXYL 75 MCG tablet daily.      . metroNIDAZOLE (FLAGYL) 500 MG tablet Three times a day.      . Multiple Vitamin (MULTIVITAMIN) capsule Take 1 capsule by mouth daily.        Marland Kitchen triamterene-hydrochlorothiazide (DYAZIDE) 37.5-25 MG per capsule Take 1 capsule by mouth daily.         Allergies  Allergen Reactions  . Penicillins Anaphylaxis  . Aspirin     Burns stomach  . Codeine     Makes pt overly  excited.... Anxious/ hyperactive    History  Substance Use Topics  . Smoking status: Never Smoker   . Smokeless tobacco: Never Used  . Alcohol Use: No   .  Review of Systems  Constitutional: Positive for fatigue. Negative for fever and chills.  Respiratory: Positive for cough. Negative for shortness of breath and wheezing.   Cardiovascular: Negative.   Gastrointestinal: Positive for abdominal pain and abdominal distention. Negative for nausea, vomiting, diarrhea, constipation, blood in stool and anal bleeding.  Genitourinary: Negative.        Objective:   Physical Exam General: Alert,Mildly overweight Caucasian female  , in no distress Skin: Warm and dry without rash or infection. Multiple seborrheic keratoses HEENT: No palpable masses or thyromegaly. Sclera nonicteric. Pupils equal round and reactive. Oropharynx clear. Lymph nodes: No cervical, supraclavicular, or inguinal nodes palpable. Lungs: Breath sounds clear and equal without increased work of breathing Cardiovascular: Regular rate and rhythm without murmur. No JVD or edema. Peripheral pulses intact. Abdomen: Nondistended. Soft with minimal left lower quadrant tenderness without guarding.. No masses palpable. No organomegaly. No palpable hernias. Extremities: No edema or joint swelling or deformity. No chronic venous stasis changes. Neurologic: Alert and fully oriented. Gait normal.  Assessment:    Persistent and recurrent sigmoid diverticulitis with multiple episodes over the past year and decreasing time between episodes. I believe she would be best served with elective colectomy in order to relieve her symptoms and avoid likely complications such as stricture or perforation. She and her family are in complete agreement. We again reviewed the surgery and risks as we have at her previous visit.    Plan:     Laparoscopic and possible open sigmoid colectomy.

## 2011-10-20 NOTE — Anesthesia Procedure Notes (Signed)
Procedure Name: Intubation Date/Time: 10/20/2011 12:28 PM Performed by: Hulan Fess Pre-anesthesia Checklist: Patient identified, Emergency Drugs available, Suction available and Timeout performed Patient Re-evaluated:Patient Re-evaluated prior to inductionOxygen Delivery Method: Circle system utilized Preoxygenation: Pre-oxygenation with 100% oxygen Intubation Type: IV induction Ventilation: Mask ventilation without difficulty Laryngoscope Size: Mac and 3 Grade View: Grade I Tube type: Oral Tube size: 8.0 mm Number of attempts: 1 Placement Confirmation: ETT inserted through vocal cords under direct vision and breath sounds checked- equal and bilateral Secured at: 22 cm Tube secured with: Tape Dental Injury: Teeth and Oropharynx as per pre-operative assessment

## 2011-10-20 NOTE — Interval H&P Note (Signed)
History and Physical Interval Note:  10/20/2011 12:00 PM  Sophia Nash  has presented today for surgery, with the diagnosis of diverticulitis  The various methods of treatment have been discussed with the patient and family. After consideration of risks, benefits and other options for treatment, the patient has consented to  Procedure(s) (LRB): LAPAROSCOPIC SIGMOID COLECTOMY (N/A) as a surgical intervention .  The patients' history has been reviewed, patient examined, no change in status, stable for surgery.  I have reviewed the patients' chart and labs.  Questions were answered to the patient's satisfaction.     Senna Lape T

## 2011-10-21 ENCOUNTER — Encounter (INDEPENDENT_AMBULATORY_CARE_PROVIDER_SITE_OTHER): Payer: Medicare Other | Admitting: General Surgery

## 2011-10-21 LAB — BASIC METABOLIC PANEL
BUN: 6 mg/dL (ref 6–23)
Chloride: 104 mEq/L (ref 96–112)
Creatinine, Ser: 0.78 mg/dL (ref 0.50–1.10)
GFR calc Af Amer: >90 mL/min (ref >90)
GFR calc non Af Amer: 80 mL/min — ABNORMAL LOW (ref >90)
Glucose, Bld: 149 mg/dL — ABNORMAL HIGH (ref 70–99)

## 2011-10-21 LAB — CBC
HCT: 35.4 % — ABNORMAL LOW (ref 36.0–46.0)
MCH: 30.8 pg (ref 26.0–34.0)
MCHC: 33.9 g/dL (ref 30.0–36.0)
RDW: 12.6 % (ref 11.5–15.5)

## 2011-10-21 MED ORDER — TRAMADOL HCL 50 MG PO TABS
50.0000 mg | ORAL_TABLET | Freq: Four times a day (QID) | ORAL | Status: DC | PRN
  Administered 2011-10-22 (×2): 50 mg via ORAL
  Filled 2011-10-21 (×2): qty 1

## 2011-10-21 NOTE — Progress Notes (Signed)
Patient ID: Sophia Nash, female   DOB: Mar 19, 1935, 77 y.o.   MRN: [id number] 1 Day Post-Op  Subjective: Patient without complaints. Has been up out of bed and voiding. Minimal pain and denies nausea.  Objective: Vital signs in last 24 hours: Temp:  [96.4 F (35.8 C)-98.8 F (37.1 C)] 98.2 F (36.8 C) (03/21 0601) Pulse Rate:  [62-85] 62  (03/21 0601) Resp:  [10-18] 16  (03/21 0601) BP: (119-171)/(50-84) 123/60 mmHg (03/21 0601) SpO2:  [94 %-100 %] 99 % (03/21 0601) Weight:  [181 lb (82.101 kg)] 181 lb (82.101 kg) (03/20 2243)    Intake/Output from previous day: 03/20 0701 - 03/21 0700 In: 2220 [I.V.:2220] Out: 1310 [Urine:1185; Blood:100] Intake/Output this shift:    General appearance: alert and no distress GI: normal findings: soft, non-tender Incision/Wound: clean and dry  Lab Results:   [facility] 10/21/11 0434  WBC 8.0  HGB 12.0  HCT 35.4*  PLT 212   BMET  Basename 10/21/11 0434  NA 137  K 4.2  CL 104  CO2 29  GLUCOSE 149*  BUN 6  CREATININE 0.78  CALCIUM 8.1*     Studies/Results: No results found.  Anti-infectives: Anti-infectives     Start     Dose/Rate Route Frequency Ordered Stop   10/20/11 1000   metroNIDAZOLE (FLAGYL) IVPB 500 mg        500 mg 100 mL/hr over 60 Minutes Intravenous  Once 10/20/11 0954 10/20/11 1200   10/20/11 0954   ciprofloxacin (CIPRO) IVPB 400 mg        400 mg 200 mL/hr over 60 Minutes Intravenous 120 min pre-op 10/20/11 0954 10/20/11 1222          Assessment/Plan: s/p Procedure(s): LAPAROSCOPIC SIGMOID COLECTOMY Doing well postoperatively without complication identified. Will start clear liquid diet. Ambulate.   LOS: 1 day    Natallie Ravenscroft T 10/21/2011

## 2011-10-22 ENCOUNTER — Encounter (INDEPENDENT_AMBULATORY_CARE_PROVIDER_SITE_OTHER): Payer: Self-pay | Admitting: General Surgery

## 2011-10-22 MED ORDER — TRAMADOL HCL 50 MG PO TABS
50.0000 mg | ORAL_TABLET | ORAL | Status: DC | PRN
  Administered 2011-10-22 – 2011-10-23 (×6): 50 mg via ORAL
  Filled 2011-10-22 (×6): qty 1

## 2011-10-22 NOTE — Progress Notes (Signed)
Dr. Johna Sheriff aware via phone pt requested IVF be discontinued. Stated " I gather fluid so easily". See new order received to [facility].

## 2011-10-22 NOTE — Progress Notes (Signed)
Patient ID: Sophia Nash, female   DOB: 1935/01/29, 77 y.o.   MRN: [id number] 2 Days Post-Op  Subjective: Feels well without complaints. She is tolerating her clear liquids without nausea. She has had a couple of bowel movements. Mild pain is well-controlled with tramadol  Objective: Vital signs in last 24 hours: Temp:  [98.2 F (36.8 C)-99 F (37.2 C)] 98.3 F (36.8 C) (03/22 0554) Pulse Rate:  [64-73] 69  (03/22 0554) Resp:  [16-18] 18  (03/22 0554) BP: (120-156)/(60-83) 156/79 mmHg (03/22 0554) SpO2:  [95 %-99 %] 95 % (03/22 0554)    Intake/Output from previous day: 03/21 0701 - 03/22 0700 In: 1809.1 [I.V.:1809.1] Out: 2300 [Urine:2300] Intake/Output this shift:    General appearance: alert and no distress GI: normal findings: soft, non-tender Incision/Wound: clean and dry without signs of infection  Lab Results:   [facility] 10/21/11 0434  WBC 8.0  HGB 12.0  HCT 35.4*  PLT 212   BMET  Basename 10/21/11 0434  NA 137  K 4.2  CL 104  CO2 29  GLUCOSE 149*  BUN 6  CREATININE 0.78  CALCIUM 8.1*     Studies/Results: No results found.  Anti-infectives: Anti-infectives     Start     Dose/Rate Route Frequency Ordered Stop   10/20/11 1000   metroNIDAZOLE (FLAGYL) IVPB 500 mg        500 mg 100 mL/hr over 60 Minutes Intravenous  Once 10/20/11 0954 10/20/11 1200   10/20/11 0954   ciprofloxacin (CIPRO) IVPB 400 mg        400 mg 200 mL/hr over 60 Minutes Intravenous 120 min pre-op 10/20/11 0954 10/20/11 1222          Assessment/Plan: s/p Procedure(s): LAPAROSCOPIC SIGMOID COLECTOMY Doing very well. Will advance diet. Possibly home next one or 2 days.   LOS: 2 days    Sophia Nash T 10/22/2011

## 2011-10-22 NOTE — Discharge Instructions (Signed)
[facility]      [facility] [phone]  OPEN ABDOMINAL SURGERY: POST OP INSTRUCTIONS  Always review your discharge instruction sheet given to you by the facility where your surgery was performed.  IF YOU HAVE DISABILITY OR FAMILY LEAVE FORMS, YOU MUST BRING THEM TO THE OFFICE FOR PROCESSING.  PLEASE DO NOT GIVE THEM TO YOUR DOCTOR.  1. A prescription for pain medication may be given to you upon discharge.  Take your pain medication as prescribed, if needed.  If narcotic pain medicine is not needed, then you may take acetaminophen (Tylenol) or ibuprofen (Advil) as needed. 2. Take your usually prescribed medications unless otherwise directed. 3. If you need a refill on your pain medication, please contact your pharmacy. They will contact our office to request authorization.  Prescriptions will not be filled after 5pm or on week-ends. 4. You should follow a light diet the first few days after arrival home, such as soup and crackers, pudding, etc.unless your doctor has advised otherwise. A high-fiber, low fat diet can be resumed as tolerated.   Be sure to include lots of fluids daily. Most patients will experience some swelling and bruising on the chest and neck area.  Ice packs will help.  Swelling and bruising can take several days to resolve 5. Most patients will experience some swelling and bruising in the area of the incision. Ice pack will help. Swelling and bruising can take several days to resolve..  6. It is common to experience some constipation if taking pain medication after surgery.  Increasing fluid intake and taking a stool softener will usually help or prevent this problem from occurring.  A mild laxative (Milk of Magnesia or Miralax) should be taken according to package directions if there are no bowel movements after 48 hours. 7.  You may have steri-strips (small skin tapes) in place directly over the incision.  These strips should be left on the skin for 7-10 days.  If your  surgeon used skin glue on the incision, you may shower in 24 hours.  The glue will flake off over the next 2-3 weeks.  Any sutures or staples will be removed at the office during your follow-up visit. You may find that a light gauze bandage over your incision may keep your staples from being rubbed or pulled. You may shower and replace the bandage daily. 8. ACTIVITIES:  You may resume regular (light) daily activities beginning the next day--such as daily self-care, walking, climbing stairs--gradually increasing activities as tolerated.  You may have sexual intercourse when it is comfortable.  Refrain from any heavy lifting or straining until approved by your doctor. a. You may drive when you no longer are taking prescription pain medication, you can comfortably wear a seatbelt, and you can safely maneuver your car and apply brakes b. Return to Work: ___________________________________ 9. You should see your doctor in the office for a follow-up appointment approximately two weeks after your surgery.  Make sure that you call for this appointment within a day or two after you arrive home to insure a convenient appointment time. OTHER INSTRUCTIONS:  _____________________________________________________________ _____________________________________________________________  WHEN TO CALL YOUR DOCTOR: 1. Fever over 101.0 2. Inability to urinate 3. Nausea and/or vomiting 4. Extreme swelling or bruising 5. Continued bleeding from incision. 6. Increased pain, redness, or drainage from the incision. 7. Difficulty swallowing or breathing 8. Muscle cramping or spasms. 9. Numbness or tingling in hands or feet or around lips.  The clinic staff is available to   answer your questions during regular business hours.  Please don't hesitate to call and ask to speak to one of the nurses if you have concerns.  For further questions, please visit [email or web address]   

## 2011-10-23 MED ORDER — TRAMADOL HCL 50 MG PO TABS
50.0000 mg | ORAL_TABLET | Freq: Four times a day (QID) | ORAL | Status: AC | PRN
Start: 2011-10-23 — End: 2011-11-02

## 2011-10-23 NOTE — Progress Notes (Signed)
Assessment unchanged from previous. States pain well controlled. Iv removed. States understanding of discharge instructions. Left via wheelchair.

## 2011-10-23 NOTE — Discharge Summary (Signed)
Physician Discharge Summary [facility]  Patient ID: Sophia Nash MRN: [id number] DOB/AGE: [birth year] 77 y.o.  Admit date: 10/20/2011 Discharge date: 10/23/2011  Admission Diagnoses:  Sigmoid diverticular disease  Discharge Diagnoses:  Active Problems:  * No active hospital problems. *    Discharged Condition: good  Hospital Course: patient admitted to surgical service.  Underwent laparoscopic sigmoid colectomy.  Post op course uneventful.  Diet advanced.  Pain well controlled.  Prepared for discharge home on POD#3.  Consults: None  Significant Diagnostic Studies: none  Treatments: surgery: lap sigmoid colectomy  Discharge Exam: Blood pressure 145/80, pulse 65, temperature 98.8 F (37.1 C), temperature source Oral, resp. rate 18, height 5' 5.5" (1.664 m), weight 181 lb (82.101 kg), SpO2 90.00%. HEENT - clear Neck - soft Chest - clear bilat Cor - RRR Abd - wounds clear and dry; BS present; pos BM Ext - no edema  Disposition: Home with family  Discharge Orders    Future Appointments: Provider: Department: Dept Phone: Center:   11/11/2011 12:00 PM Mariella Saa, MD [facility] [phone] None     Future Orders Please Complete By Expires   Diet - low sodium heart healthy      Increase activity slowly      Discharge instructions      Comments:   DISCHARGE INSTRUCTIONS TO PATIENT  REMINDER:  Carry a list of your medications and allergies with you at all times Call your pharmacy at least 1 week in advance to refill prescriptions Do not mix any prescribed pain medicine with alcohol Do not drive any motor vehicles while taking pain medication Take medications with food unless otherwise directed  Follow-up appointments (date to return to physician): Please call [phone] to confirm your follow up appointment with your surgeon.  Call your Surgeon if you have: Temperature greater than 100.4 Persistent nausea and vomiting Severe  uncontrolled pain Redness, tenderness, or signs of infection (pain, swelling, redness, odor or green/yellow discharge around the site) Difficulty breathing, headache or visual disturbances Hives Persistent dizziness or light-headedness Any other questions or concerns you may have after discharge  In an emergency, call 911 or go to an Emergency Department at a nearby hospital.   Diet: Begin with liquids, and if they are tolerated, resume your usual diet.  Avoid spicy, greasy or heavy foods.  If you have nausea or vomiting, go back to liquids.  If you cannot keep liquids down, call your doctor.  Avoid alcohol consumption while on prescription pain medications. Good nutrition promotes healing. Increase fiber and fluids.   ADDITIONAL INSTRUCTIONS: May shower.  Call [facility] office on Monday for appointment with Dr. Johna Sheriff.  Velora Heckler, MD, [facility] Office: [phone]   I understand and acknowledge receipt of the above instructions.  [name]                                                               Date/Time                                                                                                                                        R.N.'s Signature                                                                    Date/Time     No dressing needed        Medication List  As of 10/23/2011  9:36 AM   STOP taking these medications         ciprofloxacin 500 MG tablet      metroNIDAZOLE 500 MG tablet         TAKE these medications         calcium carbonate 600 MG Tabs   Commonly known as: OS-CAL   Take 300 mg by mouth daily.      citalopram 40 MG tablet   Commonly known as: CELEXA   Take 20 mg by mouth 2 (two) times daily.      LACTOBACILLUS PO   Take 100 mg by mouth every  morning.      LEVOXYL 75 MCG tablet   Generic drug: levothyroxine   Take 75 mcg by mouth every morning.      multivitamin capsule   Take 1 capsule by mouth every morning. Patient takes half      PROBIOTIC PO   Take 1 tablet by mouth every morning.      traMADol 50 MG tablet   Commonly known as: ULTRAM   Take 1 tablet (50 mg total) by mouth every 6 (six) hours as needed for pain.      triamterene-hydrochlorothiazide 37.5-25 MG per capsule   Commonly known as: DYAZIDE   Take 1 capsule by mouth every morning.           Follow-up Information    Follow up with HOXWORTH,BENJAMIN T, MD. Schedule an appointment as soon as possible for a visit in 2 weeks.   Contact information:   [employer] [address] [place] [address] [phone]         Velora Heckler, MD, [facility] Office: [phone]   Signed: Velora Heckler 10/23/2011, 9:36  AM

## 2011-10-26 ENCOUNTER — Telehealth (INDEPENDENT_AMBULATORY_CARE_PROVIDER_SITE_OTHER): Payer: Self-pay

## 2011-10-26 NOTE — Telephone Encounter (Signed)
Return patient call -- Post op appt given for 11/11/11 @ 12:00 w/Dr. Johna Sheriff, patient requested her pathology results, I briefly discussed her pathology report and informed patient that her results will be discussed at her po appt.

## 2011-10-27 ENCOUNTER — Encounter (INDEPENDENT_AMBULATORY_CARE_PROVIDER_SITE_OTHER): Payer: Self-pay

## 2011-10-28 ENCOUNTER — Encounter (INDEPENDENT_AMBULATORY_CARE_PROVIDER_SITE_OTHER): Payer: Self-pay

## 2011-11-01 ENCOUNTER — Telehealth (INDEPENDENT_AMBULATORY_CARE_PROVIDER_SITE_OTHER): Payer: Self-pay

## 2011-11-01 NOTE — Telephone Encounter (Signed)
Patient called regarding post op pain. She is 12 days out from colectomy and wondered if it was normal to have pain. I told her yes especially for 12 days and that she could expect it for a few weeks. She also stopped taking narcotic medicine so i told her she could expect pain since not taking narcotic. She will take OTC pain medicine for pain since she does not want to take narcotics.

## 2011-11-11 ENCOUNTER — Encounter (INDEPENDENT_AMBULATORY_CARE_PROVIDER_SITE_OTHER): Payer: Self-pay | Admitting: General Surgery

## 2011-11-11 ENCOUNTER — Ambulatory Visit (INDEPENDENT_AMBULATORY_CARE_PROVIDER_SITE_OTHER): Payer: Medicare Other | Admitting: General Surgery

## 2011-11-11 VITALS — BP 126/82 | HR 76 | Temp 97.4°F | Resp 16 | Ht 65.5 in | Wt 177.2 lb

## 2011-11-11 DIAGNOSIS — K5732 Diverticulitis of large intestine without perforation or abscess without bleeding: Secondary | ICD-10-CM

## 2011-11-11 NOTE — Progress Notes (Signed)
Patient returns now 3 weeks following laparoscopic sigmoid colectomy for severe chronic and recurring sigmoid diverticulitis. She has gotten along extremely well. She had just some soreness at her left lower quadrant incision which is just about resolved. Bowels are moving normally. No fever. Eating well. Her preoperative pain has been relieved.  Exam: BP 126/82  Pulse 76  Temp(Src) 97.4 F (36.3 C) (Temporal)  Resp 16  Ht 5' 5.5" (1.664 m)  Wt 177 lb 3.2 oz (80.377 kg)  BMI 29.04 kg/m2  Gen.: Appears well Abdomen: Generally soft and nontender. Wounds are well healed without complication.  Pathology confirmed acute and chronic diverticulitis  Assessment and plan: Doing very well following laparoscopic sigmoid colectomy. We discussed time table to return to full physical activity. She is doing well enough that I did not give her return appointment she understands to call me as needed for any questions or concerns

## 2011-12-09 ENCOUNTER — Encounter (INDEPENDENT_AMBULATORY_CARE_PROVIDER_SITE_OTHER): Payer: Self-pay

## 2012-10-04 ENCOUNTER — Telehealth (INDEPENDENT_AMBULATORY_CARE_PROVIDER_SITE_OTHER): Payer: Self-pay | Admitting: General Surgery

## 2012-10-04 NOTE — Telephone Encounter (Signed)
Pt calling for appt with Dr. Johna Sheriff as est pt new problem.  First available is not for several more weeks, but she reports what sounds like a new hernia with "colon sticking out." She also reports it is occasionally painful.  Pt understands we will try to work her in late next week and will call her.  She is about 1 hour away (living in [place].)

## 2012-11-16 ENCOUNTER — Encounter (INDEPENDENT_AMBULATORY_CARE_PROVIDER_SITE_OTHER): Payer: Medicare Other | Admitting: General Surgery

## 2012-12-01 ENCOUNTER — Telehealth (INDEPENDENT_AMBULATORY_CARE_PROVIDER_SITE_OTHER): Payer: Self-pay | Admitting: *Deleted

## 2012-12-01 NOTE — Telephone Encounter (Signed)
Daughter called today concerned about a growing mass at an old incision site.  Daughter states that patient had called previously to request an appt but never heard back from anyone.  Appt was scheduled for next Wed 5/7 for patient to be see by [facility] MD.

## 2012-12-06 ENCOUNTER — Ambulatory Visit (INDEPENDENT_AMBULATORY_CARE_PROVIDER_SITE_OTHER): Payer: Medicare Other | Admitting: General Surgery

## 2012-12-06 ENCOUNTER — Encounter (INDEPENDENT_AMBULATORY_CARE_PROVIDER_SITE_OTHER): Payer: Self-pay | Admitting: General Surgery

## 2012-12-06 VITALS — BP 142/78 | HR 72 | Temp 97.7°F | Resp 16 | Ht 65.0 in | Wt 188.6 lb

## 2012-12-06 DIAGNOSIS — K432 Incisional hernia without obstruction or gangrene: Secondary | ICD-10-CM

## 2012-12-06 NOTE — Progress Notes (Signed)
Subjective:  Bulge in left lower quadrant at previous colectomy incision  Patient ID: Sophia Nash, female   DOB: 05/12/1935, 78 y.o.   MRN: [id number]  HPI Patient returns to the office one year following laparoscopic sigmoid colectomy for chronic and recurring diverticulitis. She did very well postoperatively and has had no recurrence of her diverticular symptoms. She unfortunately however has developed a gradually increasing bulge in her left lower quadrant at the site of her incision. This has caused mild discomfort but no pain or associated GI symptoms. It is getting gradually bigger.  Past Medical History  Diagnosis Date  . Breast lump     left  . Thyroid disease     hypothyroidism  . Atypical moles   . Colon polyp   . Allergy   . Diverticulitis   . Edema     legs, hands  bilateral  . GERD (gastroesophageal reflux disease)   . Arthritis   . Depression   . Mild cognitive impairment with memory loss 2002    per Dr Loleta Chance, neuro  . Recurrent upper respiratory infection (URI)     cold with sinus drainage x several months- saw PCP 10/08/11- chest x ray report on chart.  no fever   Past Surgical History  Procedure Laterality Date  . Tubal ligation    . Breast surgery      lumpectomy- left-benign  . Cholecystectomy      laparoscopic  . Esophagogastroduodenoscopy    . Colonoscopy    . Colectomy  10/20/11    sigmoid colectomy   Current Outpatient Prescriptions  Medication Sig Dispense Refill  . citalopram (CELEXA) 40 MG tablet Take 20 mg by mouth 2 (two) times daily.       Marland Kitchen LEVOXYL 75 MCG tablet Take 75 mcg by mouth every morning.       . Multiple Vitamin (MULTIVITAMIN) capsule Take 1 capsule by mouth every morning. Patient takes half      . Probiotic Product (PROBIOTIC PO) Take 1 tablet by mouth every morning.       . triamterene-hydrochlorothiazide (DYAZIDE) 37.5-25 MG per capsule Take 1 capsule by mouth every morning.        No current facility-administered medications  for this visit.   Allergies  Allergen Reactions  . Penicillins Anaphylaxis  . Aspirin     Burns stomach  . Codeine     Makes pt overly excited.... Anxious/ hyperactive    History  Substance Use Topics  . Smoking status: Never Smoker   . Smokeless tobacco: Never Used  . Alcohol Use: No     Review of Systems  Constitutional: Negative for fever, chills, activity change and fatigue.  Respiratory: Negative.   Cardiovascular: Negative.   Gastrointestinal: Negative.        Objective:   Physical Exam BP 142/78  Pulse 72  Temp(Src) 97.7 F (36.5 C) (Temporal)  Resp 16  Ht 5\' 5"  (1.651 m)  Wt 188 lb 9.6 oz (85.548 kg)  BMI 31.38 kg/m2 General: Overweight but otherwise alert well-appearing Caucasian female Skin: No rash or infection HEENT: No palpable mass or thyromegaly. Sclera nonicteric. Lungs: Clear equal breath sounds without wheezing or increased work of breathing Cardiac: Regular rate and rhythm without murmur. No JVD or edema Abdomen. Well-healed incisions. Generally soft and nontender. Typically with the patient standing there is a diffuse bulging incisional hernia in the left lower quadrant in the general area of her small left lower quadrant oblique incision. With the patient lying down  this is reducible and nontender. No masses or organomegaly. Extremities: No joint swelling or edema or venous stasis changes Neurologic: Alert and fully oriented. Gait normal.    Assessment:     Enlarging incisional hernia in the left lower quadrant. This is a rather diffuse weakness. this will need to be repaired. I discussed this with the patient and her husband. I would recommend a combined open and laparoscopic approach so that I can restore the normal muscle contour and then widely reinforced the repair with intra-abdominal piece of mesh. We discussed the nature of the procedure and use of general anesthetic. Risks of anesthesia complications, bleeding, infection, intestinal injury  and recurrent hernia were discussed and understood. All her questions were answered.    Plan:     Combined laparoscopic and open repair of left lower quadrant ventral incisional hernia under general anesthesia with the least overnight observation.

## 2013-01-02 ENCOUNTER — Encounter (HOSPITAL_COMMUNITY): Payer: Self-pay | Admitting: Pharmacy Technician

## 2013-01-08 ENCOUNTER — Encounter (HOSPITAL_COMMUNITY): Payer: Self-pay

## 2013-01-08 ENCOUNTER — Encounter (HOSPITAL_COMMUNITY)
Admission: RE | Admit: 2013-01-08 | Discharge: 2013-01-08 | Disposition: A | Discharge status: Home / Self Care | Payer: Medicare Other | Source: Ambulatory Visit | Attending: General Surgery | Admitting: General Surgery

## 2013-01-08 ENCOUNTER — Ambulatory Visit (HOSPITAL_COMMUNITY)
Admission: RE | Admit: 2013-01-08 | Discharge: 2013-01-08 | Disposition: A | Discharge status: Home / Self Care | Payer: Medicare Other | Source: Ambulatory Visit | Attending: General Surgery | Admitting: General Surgery

## 2013-01-08 DIAGNOSIS — Z01812 Encounter for preprocedural laboratory examination: Secondary | ICD-10-CM | POA: Insufficient documentation

## 2013-01-08 DIAGNOSIS — K439 Ventral hernia without obstruction or gangrene: Secondary | ICD-10-CM | POA: Insufficient documentation

## 2013-01-08 DIAGNOSIS — Z01818 Encounter for other preprocedural examination: Secondary | ICD-10-CM | POA: Insufficient documentation

## 2013-01-08 DIAGNOSIS — I10 Essential (primary) hypertension: Secondary | ICD-10-CM | POA: Insufficient documentation

## 2013-01-08 HISTORY — DX: Hypothyroidism, unspecified: E03.9

## 2013-01-08 HISTORY — DX: Anemia, unspecified: D64.9

## 2013-01-08 HISTORY — DX: Essential (primary) hypertension: I10

## 2013-01-08 LAB — CBC
MCH: 31.5 pg (ref 26.0–34.0)
MCV: 90 fL (ref 78.0–100.0)
Platelets: 270 10*3/uL (ref 150–400)
RBC: 4.38 MIL/uL (ref 3.87–5.11)
RDW: 12.6 % (ref 11.5–15.5)

## 2013-01-08 LAB — BASIC METABOLIC PANEL
BUN: 19 mg/dL (ref 6–23)
CO2: 26 mEq/L (ref 19–32)
Calcium: 9.6 mg/dL (ref 8.4–10.5)
Creatinine, Ser: 0.96 mg/dL (ref 0.50–1.10)

## 2013-01-08 NOTE — Progress Notes (Signed)
Patient stated if possible she would like localized pain medication like she had with last surgery.

## 2013-01-08 NOTE — Patient Instructions (Signed)
Sophia Nash  01/08/2013   Your procedure is scheduled on:  01/12/13   Report to [facility] at   0900 AM.  Call this number if you have problems the morning of surgery: [phone]   Remember:   Do not eat food or drink liquids after midnight.   Take these medicines the morning of surgery with A SIP OF WATER:    Do not wear jewelry, make-up or nail polish.  Do not wear lotions, powders, or perfumes.   Do not shave 48 hours prior to surgery.   Do not bring valuables to the hospital.  Contacts, dentures or bridgework may not be worn into surgery.  Leave suitcase in the car. After surgery it may be brought to your room.  For patients admitted to the hospital, checkout time is 11:00 AM the day of  discharge.       SEE CHG INSTRUCTION SHEET    Please read over the following fact sheets that you were given: MRSA Information, coughing and deep breathing exercises, leg exercises               Failure to comply with these instructions may result in cancellation of your surgery.                Patient Signature ____________________________              Nurse Signature _____________________________

## 2013-01-09 NOTE — Progress Notes (Signed)
Patient returned call and received message regarding positive PCR screen for staph.  Patient voiced understanding.

## 2013-01-09 NOTE — Progress Notes (Signed)
Rerequested signed Stress Test done 01/04/13 to be faxed to [phone].  Received Stress Test but not signed by a physician.

## 2013-01-11 NOTE — Progress Notes (Signed)
On 01/10/13 rerequested baseline EKG from stress test done 01/04/13 along with Final report.

## 2013-01-11 NOTE — Progress Notes (Signed)
Rerequested and received final report on Stress Test done 01/04/13.  No baseline EKG received but EKGs thruout Stress Test are on chart with heart rate ranging from 77-124.

## 2013-01-12 ENCOUNTER — Encounter (HOSPITAL_COMMUNITY): Payer: Self-pay | Admitting: Anesthesiology

## 2013-01-12 ENCOUNTER — Observation Stay (HOSPITAL_COMMUNITY)
Admission: RE | Admit: 2013-01-12 | Discharge: 2013-01-14 | Disposition: A | Discharge status: Home / Self Care | Payer: Medicare Other | Source: Ambulatory Visit | Attending: General Surgery | Admitting: General Surgery

## 2013-01-12 ENCOUNTER — Encounter (HOSPITAL_COMMUNITY)
Admission: RE | Disposition: A | Discharge status: Home / Self Care | Payer: Self-pay | Source: Ambulatory Visit | Attending: General Surgery

## 2013-01-12 ENCOUNTER — Ambulatory Visit (HOSPITAL_COMMUNITY): Payer: Medicare Other | Admitting: Anesthesiology

## 2013-01-12 DIAGNOSIS — K5732 Diverticulitis of large intestine without perforation or abscess without bleeding: Secondary | ICD-10-CM

## 2013-01-12 DIAGNOSIS — K560 Paralytic ileus: Secondary | ICD-10-CM | POA: Insufficient documentation

## 2013-01-12 DIAGNOSIS — K219 Gastro-esophageal reflux disease without esophagitis: Secondary | ICD-10-CM | POA: Insufficient documentation

## 2013-01-12 DIAGNOSIS — K432 Incisional hernia without obstruction or gangrene: Secondary | ICD-10-CM

## 2013-01-12 DIAGNOSIS — E039 Hypothyroidism, unspecified: Secondary | ICD-10-CM | POA: Insufficient documentation

## 2013-01-12 DIAGNOSIS — Z8719 Personal history of other diseases of the digestive system: Secondary | ICD-10-CM

## 2013-01-12 DIAGNOSIS — E669 Obesity, unspecified: Secondary | ICD-10-CM | POA: Insufficient documentation

## 2013-01-12 DIAGNOSIS — Z79899 Other long term (current) drug therapy: Secondary | ICD-10-CM | POA: Insufficient documentation

## 2013-01-12 DIAGNOSIS — Z9049 Acquired absence of other specified parts of digestive tract: Secondary | ICD-10-CM | POA: Insufficient documentation

## 2013-01-12 HISTORY — PX: INCISIONAL HERNIA REPAIR: SHX193

## 2013-01-12 SURGERY — REPAIR, HERNIA, INCISIONAL, LAPAROSCOPIC
Anesthesia: General | Site: Abdomen | Wound class: Clean

## 2013-01-12 MED ORDER — ONDANSETRON HCL 4 MG/2ML IJ SOLN
4.0000 mg | Freq: Four times a day (QID) | INTRAMUSCULAR | Status: DC | PRN
  Administered 2013-01-12: 4 mg via INTRAVENOUS
  Filled 2013-01-12: qty 2

## 2013-01-12 MED ORDER — HEPARIN SODIUM (PORCINE) 5000 UNIT/ML IJ SOLN
5000.0000 [IU] | Freq: Three times a day (TID) | INTRAMUSCULAR | Status: DC
  Administered 2013-01-13 – 2013-01-14 (×3): 5000 [IU] via SUBCUTANEOUS
  Filled 2013-01-12 (×6): qty 1

## 2013-01-12 MED ORDER — OXYCODONE-ACETAMINOPHEN 5-325 MG PO TABS
1.0000 | ORAL_TABLET | ORAL | Status: DC | PRN
  Administered 2013-01-14 (×3): 1 via ORAL
  Filled 2013-01-12 (×3): qty 1

## 2013-01-12 MED ORDER — HYDROMORPHONE HCL PF 1 MG/ML IJ SOLN
INTRAMUSCULAR | Status: AC
  Filled 2013-01-12: qty 1

## 2013-01-12 MED ORDER — CIPROFLOXACIN IN D5W 400 MG/200ML IV SOLN
INTRAVENOUS | Status: AC
  Filled 2013-01-12: qty 200

## 2013-01-12 MED ORDER — BUPIVACAINE-EPINEPHRINE 0.25% -1:200000 IJ SOLN
INTRAMUSCULAR | Status: DC | PRN
  Administered 2013-01-12: 50 mL

## 2013-01-12 MED ORDER — BUPIVACAINE-EPINEPHRINE PF 0.25-1:200000 % IJ SOLN
INTRAMUSCULAR | Status: AC
  Filled 2013-01-12: qty 30

## 2013-01-12 MED ORDER — CISATRACURIUM BESYLATE (PF) 10 MG/5ML IV SOLN
INTRAVENOUS | Status: DC | PRN
  Administered 2013-01-12: 3 mg via INTRAVENOUS
  Administered 2013-01-12: 4 mg via INTRAVENOUS

## 2013-01-12 MED ORDER — MORPHINE SULFATE 2 MG/ML IJ SOLN
2.0000 mg | INTRAMUSCULAR | Status: DC | PRN
Start: 2013-01-12 — End: 2013-01-14
  Administered 2013-01-13 (×4): 2 mg via INTRAVENOUS
  Filled 2013-01-12 (×6): qty 1

## 2013-01-12 MED ORDER — ONDANSETRON HCL 4 MG/2ML IJ SOLN
INTRAMUSCULAR | Status: DC | PRN
  Administered 2013-01-12: 4 mg via INTRAVENOUS

## 2013-01-12 MED ORDER — ONDANSETRON HCL 4 MG PO TABS: 4.0000 mg | ORAL_TABLET | Freq: Four times a day (QID) | ORAL | Status: DC | PRN

## 2013-01-12 MED ORDER — PROPOFOL 10 MG/ML IV BOLUS
INTRAVENOUS | Status: DC | PRN
  Administered 2013-01-12: 150 mg via INTRAVENOUS

## 2013-01-12 MED ORDER — HYDROMORPHONE HCL PF 1 MG/ML IJ SOLN
0.2500 mg | INTRAMUSCULAR | Status: DC | PRN
  Administered 2013-01-12 (×3): 0.5 mg via INTRAVENOUS

## 2013-01-12 MED ORDER — GLYCOPYRROLATE 0.2 MG/ML IJ SOLN
INTRAMUSCULAR | Status: DC | PRN
  Administered 2013-01-12: 0.6 mg via INTRAVENOUS

## 2013-01-12 MED ORDER — SUCCINYLCHOLINE CHLORIDE 20 MG/ML IJ SOLN
INTRAMUSCULAR | Status: DC | PRN
  Administered 2013-01-12: 100 mg via INTRAVENOUS

## 2013-01-12 MED ORDER — DEXTROSE IN LACTATED RINGERS 5 % IV SOLN
INTRAVENOUS | Status: DC
  Administered 2013-01-12 – 2013-01-13 (×2): via INTRAVENOUS
  Administered 2013-01-13: 75 mL/h via INTRAVENOUS

## 2013-01-12 MED ORDER — NEOSTIGMINE METHYLSULFATE 1 MG/ML IJ SOLN
INTRAMUSCULAR | Status: DC | PRN
  Administered 2013-01-12: 4 mg via INTRAVENOUS

## 2013-01-12 MED ORDER — HEPARIN SODIUM (PORCINE) 5000 UNIT/ML IJ SOLN
5000.0000 [IU] | Freq: Once | INTRAMUSCULAR | Status: AC
  Administered 2013-01-12: 5000 [IU] via SUBCUTANEOUS
  Filled 2013-01-12: qty 1

## 2013-01-12 MED ORDER — BUPIVACAINE 0.25 % ON-Q PUMP DUAL CATH 300 ML
300.0000 mL | INJECTION | Status: DC
  Administered 2013-01-12: 300 mL
  Filled 2013-01-12: qty 300

## 2013-01-12 MED ORDER — CHLORHEXIDINE GLUCONATE 4 % EX LIQD
1.0000 "application " | Freq: Once | CUTANEOUS | Status: DC
  Filled 2013-01-12: qty 15

## 2013-01-12 MED ORDER — PROMETHAZINE HCL 25 MG/ML IJ SOLN: 6.2500 mg | INTRAMUSCULAR | Status: DC | PRN

## 2013-01-12 MED ORDER — EPHEDRINE SULFATE 50 MG/ML IJ SOLN
INTRAMUSCULAR | Status: DC | PRN
  Administered 2013-01-12: 10 mg via INTRAVENOUS
  Administered 2013-01-12: 5 mg via INTRAVENOUS
  Administered 2013-01-12: 10 mg via INTRAVENOUS

## 2013-01-12 MED ORDER — FENTANYL CITRATE 0.05 MG/ML IJ SOLN
INTRAMUSCULAR | Status: DC | PRN
  Administered 2013-01-12: 25 ug via INTRAVENOUS
  Administered 2013-01-12: 100 ug via INTRAVENOUS
  Administered 2013-01-12: 50 ug via INTRAVENOUS

## 2013-01-12 MED ORDER — CIPROFLOXACIN IN D5W 400 MG/200ML IV SOLN
400.0000 mg | INTRAVENOUS | Status: AC
  Administered 2013-01-12: 400 mg via INTRAVENOUS

## 2013-01-12 MED ORDER — LACTATED RINGERS IV SOLN
INTRAVENOUS | Status: DC
  Administered 2013-01-12: 1000 mL via INTRAVENOUS
  Administered 2013-01-12 (×2): via INTRAVENOUS

## 2013-01-12 SURGICAL SUPPLY — 56 items
BENZOIN TINCTURE PRP APPL 2/3 (GAUZE/BANDAGES/DRESSINGS) IMPLANT
BINDER ABD UNIV 12 45-62 (WOUND CARE) ×1 IMPLANT
BINDER ABDOMINAL 46IN 62IN (WOUND CARE) ×2
CANISTER SUCTION 2500CC (MISCELLANEOUS) ×2 IMPLANT
CATH KIT ON-Q SILVERSOAK 7.5IN (CATHETERS) ×4 IMPLANT
CLOTH BEACON ORANGE TIMEOUT ST (SAFETY) ×2 IMPLANT
DECANTER SPIKE VIAL GLASS SM (MISCELLANEOUS) IMPLANT
DERMABOND ADVANCED (GAUZE/BANDAGES/DRESSINGS) ×2
DERMABOND ADVANCED .7 DNX12 (GAUZE/BANDAGES/DRESSINGS) ×2 IMPLANT
DEVICE SECURE STRAP 25 ABSORB (INSTRUMENTS) ×2 IMPLANT
DEVICE TROCAR PUNCTURE CLOSURE (ENDOMECHANICALS) ×2 IMPLANT
DISSECTOR BLUNT TIP ENDO 5MM (MISCELLANEOUS) IMPLANT
DRAPE INCISE IOBAN 66X45 STRL (DRAPES) ×2 IMPLANT
DRAPE LAPAROSCOPIC ABDOMINAL (DRAPES) ×2 IMPLANT
ELECT REM PT RETURN 9FT ADLT (ELECTROSURGICAL) ×2
ELECTRODE REM PT RTRN 9FT ADLT (ELECTROSURGICAL) ×1 IMPLANT
GLOVE BIOGEL PI IND STRL 7.0 (GLOVE) ×1 IMPLANT
GLOVE BIOGEL PI INDICATOR 7.0 (GLOVE) ×1
GOWN STRL NON-REIN LRG LVL3 (GOWN DISPOSABLE) IMPLANT
GOWN STRL REIN XL XLG (GOWN DISPOSABLE) ×10 IMPLANT
KIT BASIN OR (CUSTOM PROCEDURE TRAY) ×2 IMPLANT
MARKER SKIN DUAL TIP RULER LAB (MISCELLANEOUS) ×2 IMPLANT
MESH VENTRALIGHT ST 4X6IN (Mesh General) ×2 IMPLANT
NEEDLE INSUFFLATION 14GA 150MM (NEEDLE) IMPLANT
NEEDLE SPNL 22GX3.5 QUINCKE BK (NEEDLE) ×2 IMPLANT
NS IRRIG 1000ML POUR BTL (IV SOLUTION) ×2 IMPLANT
PENCIL BUTTON HOLSTER BLD 10FT (ELECTRODE) ×2 IMPLANT
SCALPEL HARMONIC ACE (MISCELLANEOUS) IMPLANT
SCISSORS LAP 5X35 DISP (ENDOMECHANICALS) ×2 IMPLANT
SET IRRIG TUBING LAPAROSCOPIC (IRRIGATION / IRRIGATOR) IMPLANT
SLEEVE ADV FIXATION 5X100MM (TROCAR) IMPLANT
SLEEVE Z-THREAD 5X100MM (TROCAR) IMPLANT
SOLUTION ANTI FOG 6CC (MISCELLANEOUS) ×2 IMPLANT
STAPLER VISISTAT 35W (STAPLE) ×2 IMPLANT
STRIP CLOSURE SKIN 1/2X4 (GAUZE/BANDAGES/DRESSINGS) IMPLANT
SUT MNCRL AB 4-0 PS2 18 (SUTURE) ×2 IMPLANT
SUT NOVA NAB DX-16 0-1 5-0 T12 (SUTURE) ×6 IMPLANT
SUT NOVA NAB GS-21 0 18 T12 DT (SUTURE) IMPLANT
SUT PROLENE 0 CT 1 CR/8 (SUTURE) IMPLANT
SUT VIC AB 3-0 CT1 36 (SUTURE) ×2 IMPLANT
TACKER 5MM HERNIA 3.5CML NAB (ENDOMECHANICALS) IMPLANT
TOWEL OR 17X26 10 PK STRL BLUE (TOWEL DISPOSABLE) ×2 IMPLANT
TRAY FOLEY CATH 14FRSI W/METER (CATHETERS) ×2 IMPLANT
TRAY LAP CHOLE (CUSTOM PROCEDURE TRAY) ×2 IMPLANT
TROCAR ADV FIXATION 11X100MM (TROCAR) IMPLANT
TROCAR ADV FIXATION 5X100MM (TROCAR) IMPLANT
TROCAR BLADELESS OPT 5 75 (ENDOMECHANICALS) ×2 IMPLANT
TROCAR SLEEVE XCEL 5X75 (ENDOMECHANICALS) ×4 IMPLANT
TROCAR XCEL BLUNT TIP 100MML (ENDOMECHANICALS) IMPLANT
TROCAR XCEL NON-BLD 11X100MML (ENDOMECHANICALS) ×2 IMPLANT
TROCAR Z-THREAD FIOS 11X100 BL (TROCAR) IMPLANT
TROCAR Z-THREAD FIOS 5X100MM (TROCAR) IMPLANT
TROCAR Z-THREAD SLEEVE 11X100 (TROCAR) IMPLANT
TUBING FILTER THERMOFLATOR (ELECTROSURGICAL) IMPLANT
TUBING INSUFFLATION 10FT LAP (TUBING) ×2 IMPLANT
TUNNELER SHEATH ON-Q 16GX12 DP (PAIN MANAGEMENT) ×2 IMPLANT

## 2013-01-12 NOTE — Preoperative (Signed)
Beta Blockers   Reason not to administer Beta Blockers:Not Applicable, not on home BB 

## 2013-01-12 NOTE — Transfer of Care (Signed)
Immediate Anesthesia Transfer of Care Note  Patient: Sophia Nash  Procedure(s) Performed: Procedure(s): LAPAROSCOPIC/OPEN REPAIR INCISIONAL HERNIA (N/A)  Patient Location: PACU  Anesthesia Type:General  Level of Consciousness: awake, alert , sedated and patient cooperative  Airway & Oxygen Therapy: Patient Spontanous Breathing and Patient connected to face mask oxygen  Post-op Assessment: Report given to PACU RN and Post -op Vital signs reviewed and stable  Post vital signs: Reviewed and stable  Complications: No apparent anesthesia complications

## 2013-01-12 NOTE — H&P (Signed)
Subjective:   Bulge in left lower quadrant at previous colectomy incision  Patient ID: Sophia Nash, female DOB: 1934-12-01, 78 y.o. MRN: [id number]  HPI  Patient returns to the office one year following laparoscopic sigmoid colectomy for chronic and recurring diverticulitis. She did very well postoperatively and has had no recurrence of her diverticular symptoms. She unfortunately however has developed a gradually increasing bulge in her left lower quadrant at the site of her incision. This has caused mild discomfort but no pain or associated GI symptoms. It is getting gradually bigger.    Past Medical History   Diagnosis  Date   .  Breast lump      left   .  Thyroid disease      hypothyroidism   .  Atypical moles    .  Colon polyp    .  Allergy    .  Diverticulitis    .  Edema      legs, hands bilateral   .  GERD (gastroesophageal reflux disease)    .  Arthritis    .  Depression    .  Mild cognitive impairment with memory loss  2002     per Dr Loleta Chance, neuro   .  Recurrent upper respiratory infection (URI)      cold with sinus drainage x several months- saw PCP 10/08/11- chest x ray report on chart. no fever    Past Surgical History   Procedure  Laterality  Date   .  Tubal ligation     .  Breast surgery       lumpectomy- left-benign   .  Cholecystectomy       laparoscopic   .  Esophagogastroduodenoscopy     .  Colonoscopy     .  Colectomy   10/20/11     sigmoid colectomy    Current Outpatient Prescriptions   Medication  Sig  Dispense  Refill   .  citalopram (CELEXA) 40 MG tablet  Take 20 mg by mouth 2 (two) times daily.     Marland Kitchen  LEVOXYL 75 MCG tablet  Take 75 mcg by mouth every morning.     .  Multiple Vitamin (MULTIVITAMIN) capsule  Take 1 capsule by mouth every morning. Patient takes half     .  Probiotic Product (PROBIOTIC PO)  Take 1 tablet by mouth every morning.     .  triamterene-hydrochlorothiazide (DYAZIDE) 37.5-25 MG per capsule  Take 1 capsule by mouth every  morning.      No current facility-administered medications for this visit.    Allergies   Allergen  Reactions   .  Penicillins  Anaphylaxis   .  Aspirin      Burns stomach   .  Codeine      Makes pt overly excited.... Anxious/ hyperactive    History   Substance Use Topics   .  Smoking status:  Never Smoker   .  Smokeless tobacco:  Never Used   .  Alcohol Use:  No   Review of Systems  Constitutional: Negative for fever, chills, activity change and fatigue.  Respiratory: Negative.  Cardiovascular: Negative.  Gastrointestinal: Negative.   Objective:   Physical Exam  BP 157/80  Pulse 74  Temp(Src) 97.7 F (36.5 C) (Oral)  Resp 18  SpO2 98%  General: Overweight but otherwise alert well-appearing Caucasian female  Skin: No rash or infection  HEENT: No palpable mass or thyromegaly. Sclera nonicteric.  Lungs: Clear equal breath sounds  without wheezing or increased work of breathing  Cardiac: Regular rate and rhythm without murmur. No JVD or edema  Abdomen. Well-healed incisions. Generally soft and nontender. Typically with the patient standing there is a diffuse bulging incisional hernia in the left lower quadrant in the general area of her small left lower quadrant oblique incision. With the patient lying down this is reducible and nontender. No masses or organomegaly.  Extremities: No joint swelling or edema or venous stasis changes  Neurologic: Alert and fully oriented. Gait normal.  Assessment:   Enlarging incisional hernia in the left lower quadrant. This is a rather diffuse weakness. this will need to be repaired. I discussed this with the patient and her husband. I would recommend a combined open and laparoscopic approach so that I can restore the normal muscle contour and then widely reinforced the repair with intra-abdominal piece of mesh. We discussed the nature of the procedure and use of general anesthetic. Risks of anesthesia complications, bleeding, infection,  intestinal injury and recurrent hernia were discussed and understood. All her questions were answered.  Plan:   Combined laparoscopic and open repair of left lower quadrant ventral incisional hernia under general anesthesia with the least overnight observation.

## 2013-01-12 NOTE — Op Note (Signed)
Preoperative Diagnosis: incisional hernia  Postoprative Diagnosis: incisional hernia  Procedure: Procedure(s): LAPAROSCOPIC/OPEN REPAIR INCISIONAL HERNIA   Surgeon: Glenna Fellows T   Assistants: None  Anesthesia:  General endotracheal anesthesia  Indications:   Patient is a 78 year old female with a history of laparoscopic sigmoid colectomy for diverticulitis with a small extraction the incision in the left lower quadrant. She presents now with an enlarging incisional hernia at the left lower quadrant incision. As detailed elsewhere we have discussed options indications and risks of the procedure.  Procedure Detail:  Patient is brought to the operating room, placed in the supine position on the operating table, and general anesthesia induced. Foley catheter was placed. She received preoperative IV antibiotics. PAS replaced. The abdomen was widely sterilely prepped and draped with an Ioban drape.  Patient timeout was performed and correct procedure verified. Access was obtained in the right upper quadrant without difficulty with a 5 mm Optiview trocar and pneumoperitoneum established. Under direct vision I placed a 5 mm trocar in the right midabdomen and an 11 mm trocar in the right lower quadrant. The hernia defect was obvious in the left lower quadrant at about McBurney's point. There were minimal omental adhesions to the edge of the defect which were taken down with scissor cautery. There were no bowel adhesions. There was a large hernia sac and the defect measured approximately 6 x 4 cm. As previously planned I then made a small approximately 5 cm incision directly over the hernia sac and dissection was carried down through the hernia sac into the peritoneal cavity. The hernia sac was completely excised. I then primarily closed the abdominal wall in a single layer with interrupted #1 Novafils. Following this pneumoperitoneum was reestablished. The muscular repair appeared solid. I chose a  15 x 10 cm piece of Bard VentralLight DualMesh. 4 stay sutures were placed and the mesh was moistened and introduced into the abdominal cavity. It was oriented correctly. The 4 stay sutures were then brought up through the abdominal wall with the Endo Close device with nice wide deployment of the mesh over the repair. The sutures were secured. There was bleeding from the medial stitch possibly in the area of the epigastric vessels. However with several minutes of firm pressure using a 4x4 gauze internally the bleeding completely stopped. Following this the mesh was further tacked circumferentially with the Securestrap attack or and an inner circle staples also placed. This appeared to provide nice broad coverage of the repair. Following this under laparoscopic vision 2 On Q catheters were placed pre-peritoneally on the medial and lateral side of the repair. The abdomen was inspected and there was no bleeding or evidence of injury or other problems. All CO2 was evacuated trochars removed. The hernia site was closed in layers with interrupted 3-0 Vicryl and all skin incisions closed with subcuticular Monocryl and Dermabond. Sponge, needle and instrument counts were correct. Binder was placed.   Estimated Blood Loss:  Minimal         Drains: None  Blood Given: none          Specimens: None        Complications:  * No complications entered in OR log *         Disposition: PACU - hemodynamically stable.         Condition: stable

## 2013-01-12 NOTE — Anesthesia Postprocedure Evaluation (Signed)
  Anesthesia Post-op Note  Patient: Sophia Nash  Procedure(s) Performed: Procedure(s) (LRB): LAPAROSCOPIC/OPEN REPAIR INCISIONAL HERNIA (N/A)  Patient Location: PACU  Anesthesia Type: General  Level of Consciousness: awake and alert   Airway and Oxygen Therapy: Patient Spontanous Breathing  Post-op Pain: mild  Post-op Assessment: Post-op Vital signs reviewed, Patient's Cardiovascular Status Stable, Respiratory Function Stable, Patent Airway and No signs of Nausea or vomiting  Last Vitals:  Filed Vitals:   01/12/13 1529  BP: 126/55  Pulse: 67  Temp: 36.7 C  Resp: 16    Post-op Vital Signs: stable   Complications: No apparent anesthesia complications

## 2013-01-12 NOTE — Anesthesia Preprocedure Evaluation (Addendum)
Anesthesia Evaluation  Patient identified by MRN, date of birth, ID band Patient awake    Reviewed: Allergy & Precautions, H&P , NPO status , Patient's Chart, lab work & pertinent test results  Airway Mallampati: II TM Distance: >3 FB Neck ROM: Full    Dental   Upper right incisor is cracked. Upper left incisor has bonding.:   Pulmonary Recent URI ,  breath sounds clear to auscultation  Pulmonary exam normal       Cardiovascular Exercise Tolerance: Good hypertension, Pt. on medications negative cardio ROS  Rhythm:Regular Rate:Normal     Neuro/Psych PSYCHIATRIC DISORDERS Depression negative neurological ROS     GI/Hepatic Neg liver ROS, GERD-  ,  Endo/Other  Hypothyroidism   Renal/GU negative Renal ROS  negative genitourinary   Musculoskeletal negative musculoskeletal ROS (+)   Abdominal (+) + obese,   Peds negative pediatric ROS (+)  Hematology negative hematology ROS (+)   Anesthesia Other Findings   Reproductive/Obstetrics negative OB ROS                          Anesthesia Physical Anesthesia Plan  ASA: II  Anesthesia Plan: General   Post-op Pain Management:    Induction: Intravenous  Airway Management Planned: Oral ETT  Additional Equipment:   Intra-op Plan:   Post-operative Plan: Extubation in OR  Informed Consent: I have reviewed the patients History and Physical, chart, labs and discussed the procedure including the risks, benefits and alternatives for the proposed anesthesia with the patient or authorized representative who has indicated his/her understanding and acceptance.   Dental advisory given  Plan Discussed with: CRNA  Anesthesia Plan Comments:         Anesthesia Quick Evaluation

## 2013-01-13 LAB — CBC
HCT: 34.4 % — ABNORMAL LOW (ref 36.0–46.0)
MCHC: 34.6 g/dL (ref 30.0–36.0)
MCV: 89.8 fL (ref 78.0–100.0)
RDW: 12.6 % (ref 11.5–15.5)

## 2013-01-13 NOTE — Progress Notes (Signed)
Patient ID: Sophia Nash, female   DOB: 14-Apr-1935, 78 y.o.   MRN: [id number] [facility] Surgery Progress Note:   1 Day Post-Op  Subjective: Mental status is clear.  No complaints.  Pain controlled with IV and onQ Objective: Vital signs in last 24 hours: Temp:  [97.6 F (36.4 C)-98.7 F (37.1 C)] 98.6 F (37 C) (06/14 1000) Pulse Rate:  [64-87] 65 (06/14 1000) Resp:  [12-18] 18 (06/14 1000) BP: (114-143)/(46-84) 140/79 mmHg (06/14 1000) SpO2:  [92 %-100 %] 98 % (06/14 1000) Weight:  [192 lb 4.4 oz (87.214 kg)] 192 lb 4.4 oz (87.214 kg) (06/13 1529)  Intake/Output from previous day: 06/13 0701 - 06/14 0700 In: 3133.8 [I.V.:3133.8] Out: 450 [Urine:450] Intake/Output this shift:    Physical Exam: Work of breathing is normal.  Abdominal binder in place.  Tolerating clears  Lab Results:  Results for orders placed during the hospital encounter of 01/12/13 (from the past 48 hour(s))  CBC     Status: Abnormal   Collection Time    01/13/13  4:47 AM      Result Value Range   WBC 9.8  4.0 - 10.5 K/uL   RBC 3.83 (*) 3.87 - 5.11 MIL/uL   Hemoglobin 11.9 (*) 12.0 - 15.0 g/dL   HCT 40.9 (*) 81.1 - 91.4 %   MCV 89.8  78.0 - 100.0 fL   MCH 31.1  26.0 - 34.0 pg   MCHC 34.6  30.0 - 36.0 g/dL   RDW 78.2  95.6 - 21.3 %   Platelets 214  150 - 400 K/uL    Radiology/Results: No results found.  Anti-infectives: Anti-infectives   Start     Dose/Rate Route Frequency Ordered Stop   01/12/13 0904  ciprofloxacin (CIPRO) IVPB 400 mg     400 mg 200 mL/hr over 60 Minutes Intravenous On call to O.R. 01/12/13 0904 01/12/13 1121      Assessment/Plan: Problem List: Patient Active Problem List   Diagnosis Date Noted  . Diverticulitis of colon 09/23/2011    Will advance to full liquids.  Doing well  1 Day Post-Op    LOS: 1 day   Matt B. Daphine Deutscher, MD, [facility] [phone] beeper [phone]  01/13/2013 10:51 AM

## 2013-01-14 DIAGNOSIS — Z8719 Personal history of other diseases of the digestive system: Secondary | ICD-10-CM

## 2013-01-14 LAB — CBC
MCH: 30.2 pg (ref 26.0–34.0)
MCHC: 33.1 g/dL (ref 30.0–36.0)
Platelets: 196 10*3/uL (ref 150–400)

## 2013-01-14 MED ORDER — OXYCODONE-ACETAMINOPHEN 5-325 MG PO TABS: 1.0000 | ORAL_TABLET | ORAL | Status: DC | PRN

## 2013-01-14 NOTE — Discharge Summary (Signed)
Physician Discharge Summary  Patient ID: Sophia Nash MRN: [id number] DOB/AGE: 02/20/1935 78 y.o.  Admit date: 01/12/2013 Discharge date: 01/14/2013  Admission Diagnoses: ventral hernia  Discharge Diagnoses:  Post laparoscopic ventral hernia repair  Active Problems:   H/O ventral hernia repair   Surgery:  Laparoscopic ventral hernia repair  Discharged Condition: improved  Hospital Course:   Had surgery.  Mild ileus resolved and was taking liquids and oral medications.  Ready for discharge with her husband (who was on the 4 th floor at [facility]) on this Sunday morning.  Incisions OK and OnQ pump in place.   Consults: none  Significant Diagnostic Studies: non    Discharge Exam: Blood pressure 131/83, pulse 65, temperature 97.6 F (36.4 C), temperature source Oral, resp. rate 16, height 5\' 6"  (1.676 m), weight 192 lb 4.4 oz (87.214 kg), SpO2 94.00%. Incisions OK.  On Q pump in place.    Disposition: 01-Home or Self Care  Discharge Orders   Future Appointments Provider Department Dept Phone   01/25/2013 12:15 PM Mariella Saa, MD [facility] Surgery, [place] [phone]   Future Orders Complete By Expires     Diet - low sodium heart healthy  As directed     Discharge instructions  As directed     Comments:      Instruct patient on removal of onQ pump when completed.    Discharge wound care:  As directed     Comments:      May shower after onQ pumps are removed.    Increase activity slowly  As directed         Medication List    TAKE these medications       CALCIUM-CARB 600 PO  Take 0.5 tablets by mouth every morning.     citalopram 40 MG tablet  Commonly known as:  CELEXA  Take 20 mg by mouth 2 (two) times daily.     fluticasone 50 MCG/ACT nasal spray  Commonly known as:  FLONASE  Place 2 sprays into the nose daily as needed for rhinitis.     levothyroxine 75 MCG tablet  Commonly known as:  SYNTHROID, LEVOTHROID  Take 75 mcg by mouth daily before  breakfast.     multivitamin with minerals Tabs  Take 0.5 tablets by mouth daily.     oxyCODONE-acetaminophen 5-325 MG per tablet  Commonly known as:  PERCOCET/ROXICET  Take 1-2 tablets by mouth every 4 (four) hours as needed.     triamterene-hydrochlorothiazide 37.5-25 MG per capsule  Commonly known as:  DYAZIDE  Take 1 capsule by mouth every morning.           Follow-up Information   Follow up with Mariella Saa, MD.   Contact information:   [address] [place] [address] [phone]       Signed: Valarie Merino 01/14/2013, 8:55 AM

## 2013-01-14 NOTE — Progress Notes (Signed)
Discharge instruction given to patient and both daughters. Instructions on how and when to remove q pump given to both daughters per Dr Ermalene Searing request. Supplies given. Daughters able to answer questions about how to remove q pump and patients home meds and care. Discharged with family via wheel chair.

## 2013-01-15 ENCOUNTER — Encounter (HOSPITAL_COMMUNITY): Payer: Self-pay | Admitting: General Surgery

## 2013-01-15 ENCOUNTER — Telehealth (INDEPENDENT_AMBULATORY_CARE_PROVIDER_SITE_OTHER): Payer: Self-pay | Admitting: General Surgery

## 2013-01-15 NOTE — Telephone Encounter (Signed)
Patient called in with questions/concerns regarding: -taking the On Q pump -pain medication causing constipation -no BM since pre-op -weight increase of 5 lbs. -she just started back on her daily regimen of medications today  I advised patient that the On Q pump could be removed today. The pain medication can be causing constipation. She started back on her daily medications today, I advised this could be the reason for the weight gain. I encouraged increase in fluids along with MOM and add a stool softener per day.

## 2013-01-25 ENCOUNTER — Encounter (INDEPENDENT_AMBULATORY_CARE_PROVIDER_SITE_OTHER): Payer: Self-pay | Admitting: General Surgery

## 2013-01-25 ENCOUNTER — Ambulatory Visit (INDEPENDENT_AMBULATORY_CARE_PROVIDER_SITE_OTHER): Payer: Medicare Other | Admitting: General Surgery

## 2013-01-25 VITALS — BP 146/88 | HR 66 | Temp 98.0°F | Resp 15 | Ht 65.5 in | Wt 184.2 lb

## 2013-01-25 DIAGNOSIS — Z09 Encounter for follow-up examination after completed treatment for conditions other than malignant neoplasm: Secondary | ICD-10-CM

## 2013-01-25 NOTE — Progress Notes (Signed)
History: Patient returns 2 weeks following laparoscopic and open combined repair of left lower quadrant incisional hernia status post laparoscopic colectomy. She generally is getting along well. She did have some sharp pain in the area of the repair a couple of days ago which turned over in bed but this has now improved. She is returning to normal activities. No GI complaints.  Exam: BP 146/88  Pulse 66  Temp(Src) 98 F (36.7 C) (Temporal)  Resp 15  Ht 5' 5.5" (1.664 m)  Wt 184 lb 3.2 oz (83.553 kg)  BMI 30.18 kg/m2 General: Appears well Abdomen: Incision is healing nicely. Nontender. No evidence of recurrence or infection or seroma or other complication.  Assessment and plan: Doing well following laparoscopic incisional hernia repair. We discussed activity limitations. She will return in one month for likely a final check.

## 2015-01-17 ENCOUNTER — Other Ambulatory Visit: Payer: Self-pay | Admitting: General Surgery

## 2015-03-28 NOTE — Progress Notes (Signed)
01-08-13 - 2V CXR - EPIC

## 2015-03-28 NOTE — Patient Instructions (Signed)
Zeniya Lapidus Callas  03/28/2015   Your procedure is scheduled on:  Sept. 2, 2016  Report to [facility]  Entrance take [facility]  elevators to 3rd floor to  [place] at  8:00 AM.  Call this number if you have problems the morning of surgery [phone]   Remember: ONLY 1 PERSON MAY GO WITH YOU TO SHORT STAY TO GET  READY MORNING OF [place].  Do not eat food or drink liquids :After Midnight.     Take these medicines the morning of surgery with A SIP OF WATER: Flonase, Levothyroxine, Clasitin, Prilosec, Zoloft                               You may not have any metal on your body including hair pins and              piercings  Do not wear jewelry, make-up, lotions, powders or perfumes, deodorant             Do not wear nail polish.  Do not shave  48 hours prior to surgery.        .   Do not bring valuables to the hospital. [place].  Contacts, dentures or bridgework may not be worn into surgery.  Leave suitcase in the car. After surgery it may be brought to your room.        Special Instructions: coughing and deep breathing exercises, leg exercises              Please read over the following fact sheets you were given: _____________________________________________________________________             [facility] - Preparing for Surgery Before surgery, you can play an important role.  Because skin is not sterile, your skin needs to be as free of germs as possible.  You can reduce the number of germs on your skin by washing with CHG (chlorahexidine gluconate) soap before surgery.  CHG is an antiseptic cleaner which kills germs and bonds with the skin to continue killing germs even after washing. Please DO NOT use if you have an allergy to CHG or antibacterial soaps.  If your skin becomes reddened/irritated stop using the CHG and inform your nurse when you arrive at Short Stay. Do not shave (including  legs and underarms) for at least 48 hours prior to the first CHG shower.  You may shave your face/neck. Please follow these instructions carefully:  1.  Shower with CHG Soap the night before surgery and the  morning of Surgery.  2.  If you choose to wash your hair, wash your hair first as usual with your  normal  shampoo.  3.  After you shampoo, rinse your hair and body thoroughly to remove the  shampoo.                           4.  Use CHG as you would any other liquid soap.  You can apply chg directly  to the skin and wash                       Gently with a scrungie or clean washcloth.  5.  Apply the CHG Soap to your body ONLY FROM THE NECK DOWN.   Do not use on face/ open                           Wound or open sores. Avoid contact with eyes, ears mouth and genitals (private parts).                       Wash face,  Genitals (private parts) with your normal soap.             6.  Wash thoroughly, paying special attention to the area where your surgery  will be performed.  7.  Thoroughly rinse your body with warm water from the neck down.  8.  DO NOT shower/wash with your normal soap after using and rinsing off  the CHG Soap.                9.  Pat yourself dry with a clean towel.            10.  Wear clean pajamas.            11.  Place clean sheets on your bed the night of your first shower and do not  sleep with pets. Day of Surgery : Do not apply any lotions/deodorants the morning of surgery.  Please wear clean clothes to the hospital/surgery center.  FAILURE TO FOLLOW THESE INSTRUCTIONS MAY RESULT IN THE CANCELLATION OF YOUR SURGERY PATIENT SIGNATURE_________________________________  NURSE SIGNATURE__________________________________  ________________________________________________________________________

## 2015-03-31 ENCOUNTER — Inpatient Hospital Stay (HOSPITAL_COMMUNITY)
Admission: RE | Admit: 2015-03-31 | Discharge: 2015-03-31 | Disposition: A | Discharge status: Home / Self Care | Payer: Medicare HMO | Source: Ambulatory Visit

## 2015-04-08 ENCOUNTER — Other Ambulatory Visit: Payer: Self-pay | Admitting: Oncology

## 2015-04-15 ENCOUNTER — Encounter (HOSPITAL_COMMUNITY): Payer: Self-pay

## 2015-04-15 ENCOUNTER — Encounter (HOSPITAL_COMMUNITY)
Admission: RE | Admit: 2015-04-15 | Discharge: 2015-04-15 | Disposition: A | Discharge status: Home / Self Care | Payer: Medicare HMO | Source: Ambulatory Visit | Attending: General Surgery | Admitting: General Surgery

## 2015-04-15 DIAGNOSIS — K432 Incisional hernia without obstruction or gangrene: Secondary | ICD-10-CM | POA: Diagnosis not present

## 2015-04-15 DIAGNOSIS — Z01818 Encounter for other preprocedural examination: Secondary | ICD-10-CM | POA: Diagnosis not present

## 2015-04-15 HISTORY — DX: Reserved for inherently not codable concepts without codable children: IMO0001

## 2015-04-15 HISTORY — DX: Disorder of breast, unspecified: N64.9

## 2015-04-15 HISTORY — DX: Unspecified hearing loss, unspecified ear: H91.90

## 2015-04-15 HISTORY — DX: Pain in unspecified shoulder: M25.519

## 2015-04-15 HISTORY — DX: Other specified disorders of the skin and subcutaneous tissue: L98.8

## 2015-04-15 LAB — BASIC METABOLIC PANEL
Anion gap: 8 (ref 5–15)
BUN: 21 mg/dL — AB (ref 6–20)
CALCIUM: 9.1 mg/dL (ref 8.9–10.3)
CHLORIDE: 107 mmol/L (ref 101–111)
CO2: 26 mmol/L (ref 22–32)
CREATININE: 1.09 mg/dL — AB (ref 0.44–1.00)
GFR, EST AFRICAN AMERICAN: 55 mL/min — AB (ref >60)
GFR, EST NON AFRICAN AMERICAN: 48 mL/min — AB (ref >60)
Glucose, Bld: 111 mg/dL — ABNORMAL HIGH (ref 65–99)
Potassium: 3.6 mmol/L (ref 3.5–5.1)
SODIUM: 141 mmol/L (ref 135–145)

## 2015-04-15 LAB — CBC
HCT: 39 % (ref 36.0–46.0)
HEMOGLOBIN: 13.2 g/dL (ref 12.0–15.0)
MCH: 30.6 pg (ref 26.0–34.0)
MCHC: 33.8 g/dL (ref 30.0–36.0)
MCV: 90.3 fL (ref 78.0–100.0)
PLATELETS: 281 10*3/uL (ref 150–400)
RBC: 4.32 MIL/uL (ref 3.87–5.11)
RDW: 12.6 % (ref 11.5–15.5)
WBC: 8.2 10*3/uL (ref 4.0–10.5)

## 2015-04-15 NOTE — Patient Instructions (Addendum)
[place]  04/15/2015   Your procedure is scheduled on:   04-23-2015 Wednesday  Enter through [facility]  Entrance and follow signs to [facility]. Arrive at    0900    AM .  (Limit 1 person with you).  Call this number if you have problems the morning of surgery: [phone]  Or Presurgical Testing [phone].   For Living Will and/or Health Care Power Attorney Forms: please provide copy for your medical record,may bring AM of surgery(Forms should be already notarized -we do not provide this service).(04-15-15 Yes, will bring AM of 04-23-15- document is with lawyer -family is aware.)  Remember: Follow any bowel prep instructions per MD office.    Do not eat food/ or drink: After Midnight.      Take these medicines the morning of surgery with A SIP OF WATER: Omeprazole. Levothyroxine.Sertraline. Loratadine. Use Nasal Spray-usual.    Do not wear jewelry, make-up or nail polish.  Do not wear deodorant, lotions, powders, or perfumes.   Do not shave legs and under arms- 48 hours(2 days) prior to first CHG shower.(Shaving face and neck okay.)  Do not bring valuables to the hospital.(Hospital is not responsible for lost valuables).  Contacts, dentures or removable bridgework, body piercing, hair pins may not be worn into surgery.  Leave suitcase in the car. After surgery it may be brought to your room.  For patients admitted to the hospital, checkout time is 11:00 AM the day of discharge.(Restricted visitors-Any Persons displaying flu-like symptoms or illness).    Patients discharged the day of surgery will not be allowed to drive home. Must have responsible person with you x 24 hours once discharged.  Name and phone number of your driver: Daughter - Tia Masker- [phone]      Please read over the following fact sheets that you were given:  CHG(Chlorhexidine Gluconate 4% Surgical Soap) use.          [facility] - Preparing for Surgery Before surgery, you can  play an important role.  Because skin is not sterile, your skin needs to be as free of germs as possible.  You can reduce the number of germs on your skin by washing with CHG (chlorahexidine gluconate) soap before surgery.  CHG is an antiseptic cleaner which kills germs and bonds with the skin to continue killing germs even after washing. Please DO NOT use if you have an allergy to CHG or antibacterial soaps.  If your skin becomes reddened/irritated stop using the CHG and inform your nurse when you arrive at Short Stay. Do not shave (including legs and underarms) for at least 48 hours prior to the first CHG shower.  You may shave your face/neck. Please follow these instructions carefully:  1.  Shower with CHG Soap the night before surgery and the  morning of Surgery.  2.  If you choose to wash your hair, wash your hair first as usual with your  normal  shampoo.  3.  After you shampoo, rinse your hair and body thoroughly to remove the  shampoo.                           4.  Use CHG as you would any other liquid soap.  You can apply chg directly  to the skin and wash  Gently with a scrungie or clean washcloth.  5.  Apply the CHG Soap to your body ONLY FROM THE NECK DOWN.   Do not use on face/ open                           Wound or open sores. Avoid contact with eyes, ears mouth and genitals (private parts).                       Wash face,  Genitals (private parts) with your normal soap.             6.  Wash thoroughly, paying special attention to the area where your surgery  will be performed.  7.  Thoroughly rinse your body with warm water from the neck down.  8.  DO NOT shower/wash with your normal soap after using and rinsing off  the CHG Soap.                9.  Pat yourself dry with a clean towel.            10.  Wear clean pajamas.            11.  Place clean sheets on your bed the night of your first shower and do not  sleep with pets. Day of Surgery : Do not apply any  lotions/deodorants the morning of surgery.  Please wear clean clothes to the hospital/surgery center.  FAILURE TO FOLLOW THESE INSTRUCTIONS MAY RESULT IN THE CANCELLATION OF YOUR SURGERY PATIENT SIGNATURE_________________________________  NURSE SIGNATURE__________________________________  ________________________________________________________________________

## 2015-04-15 NOTE — Pre-Procedure Instructions (Signed)
EKG done today.

## 2015-04-23 ENCOUNTER — Inpatient Hospital Stay (HOSPITAL_COMMUNITY): Payer: Medicare HMO | Admitting: Anesthesiology

## 2015-04-23 ENCOUNTER — Encounter (HOSPITAL_COMMUNITY)
Admission: RE | Disposition: A | Discharge status: Home / Self Care | Payer: Self-pay | Source: Ambulatory Visit | Attending: General Surgery

## 2015-04-23 ENCOUNTER — Encounter (HOSPITAL_COMMUNITY): Payer: Self-pay | Admitting: *Deleted

## 2015-04-23 ENCOUNTER — Inpatient Hospital Stay (HOSPITAL_COMMUNITY)
Admission: RE | Admit: 2015-04-23 | Discharge: 2015-04-25 | DRG: 355 | Disposition: A | Discharge status: Home / Self Care | Payer: Medicare HMO | Source: Ambulatory Visit | Attending: Surgery | Admitting: Surgery

## 2015-04-23 DIAGNOSIS — M1990 Unspecified osteoarthritis, unspecified site: Secondary | ICD-10-CM | POA: Diagnosis present

## 2015-04-23 DIAGNOSIS — Z86010 Personal history of colonic polyps: Secondary | ICD-10-CM

## 2015-04-23 DIAGNOSIS — Z8249 Family history of ischemic heart disease and other diseases of the circulatory system: Secondary | ICD-10-CM | POA: Diagnosis not present

## 2015-04-23 DIAGNOSIS — E039 Hypothyroidism, unspecified: Secondary | ICD-10-CM | POA: Diagnosis present

## 2015-04-23 DIAGNOSIS — Z79899 Other long term (current) drug therapy: Secondary | ICD-10-CM

## 2015-04-23 DIAGNOSIS — F419 Anxiety disorder, unspecified: Secondary | ICD-10-CM | POA: Diagnosis present

## 2015-04-23 DIAGNOSIS — Z01810 Encounter for preprocedural cardiovascular examination: Secondary | ICD-10-CM | POA: Diagnosis not present

## 2015-04-23 DIAGNOSIS — Z886 Allergy status to analgesic agent status: Secondary | ICD-10-CM

## 2015-04-23 DIAGNOSIS — K432 Incisional hernia without obstruction or gangrene: Secondary | ICD-10-CM | POA: Diagnosis present

## 2015-04-23 DIAGNOSIS — K219 Gastro-esophageal reflux disease without esophagitis: Secondary | ICD-10-CM | POA: Diagnosis present

## 2015-04-23 DIAGNOSIS — Z885 Allergy status to narcotic agent status: Secondary | ICD-10-CM | POA: Diagnosis not present

## 2015-04-23 DIAGNOSIS — Z808 Family history of malignant neoplasm of other organs or systems: Secondary | ICD-10-CM

## 2015-04-23 DIAGNOSIS — Z888 Allergy status to other drugs, medicaments and biological substances status: Secondary | ICD-10-CM

## 2015-04-23 DIAGNOSIS — F329 Major depressive disorder, single episode, unspecified: Secondary | ICD-10-CM | POA: Diagnosis present

## 2015-04-23 DIAGNOSIS — I10 Essential (primary) hypertension: Secondary | ICD-10-CM | POA: Diagnosis present

## 2015-04-23 DIAGNOSIS — Z803 Family history of malignant neoplasm of breast: Secondary | ICD-10-CM | POA: Diagnosis not present

## 2015-04-23 DIAGNOSIS — Z800 Family history of malignant neoplasm of digestive organs: Secondary | ICD-10-CM

## 2015-04-23 DIAGNOSIS — H9190 Unspecified hearing loss, unspecified ear: Secondary | ICD-10-CM | POA: Diagnosis present

## 2015-04-23 DIAGNOSIS — Z01812 Encounter for preprocedural laboratory examination: Secondary | ICD-10-CM | POA: Diagnosis not present

## 2015-04-23 DIAGNOSIS — E780 Pure hypercholesterolemia: Secondary | ICD-10-CM | POA: Diagnosis present

## 2015-04-23 HISTORY — PX: INCISIONAL HERNIA REPAIR: SHX193

## 2015-04-23 HISTORY — PX: INSERTION OF MESH: SHX5868

## 2015-04-23 SURGERY — REPAIR, HERNIA, INCISIONAL, LAPAROSCOPIC
Anesthesia: General | Site: Abdomen

## 2015-04-23 MED ORDER — DEXAMETHASONE SODIUM PHOSPHATE 10 MG/ML IJ SOLN
INTRAMUSCULAR | Status: AC
  Filled 2015-04-23: qty 1

## 2015-04-23 MED ORDER — MIDAZOLAM HCL 5 MG/5ML IJ SOLN
INTRAMUSCULAR | Status: DC | PRN
  Administered 2015-04-23: 0.5 mg via INTRAVENOUS
  Administered 2015-04-23: 1 mg via INTRAVENOUS
  Administered 2015-04-23: 0.5 mg via INTRAVENOUS

## 2015-04-23 MED ORDER — PROPOFOL 10 MG/ML IV BOLUS
INTRAVENOUS | Status: DC | PRN
  Administered 2015-04-23: 150 mg via INTRAVENOUS

## 2015-04-23 MED ORDER — SUGAMMADEX SODIUM 200 MG/2ML IV SOLN
INTRAVENOUS | Status: AC
  Filled 2015-04-23: qty 2

## 2015-04-23 MED ORDER — FENTANYL CITRATE (PF) 100 MCG/2ML IJ SOLN
INTRAMUSCULAR | Status: AC
  Filled 2015-04-23: qty 2

## 2015-04-23 MED ORDER — ACETAMINOPHEN 325 MG PO TABS
650.0000 mg | ORAL_TABLET | Freq: Four times a day (QID) | ORAL | Status: DC | PRN
  Administered 2015-04-23: 650 mg via ORAL
  Filled 2015-04-23 (×3): qty 2

## 2015-04-23 MED ORDER — PHENYLEPHRINE HCL 10 MG/ML IJ SOLN
INTRAMUSCULAR | Status: DC | PRN
  Administered 2015-04-23: 40 ug via INTRAVENOUS

## 2015-04-23 MED ORDER — SERTRALINE HCL 50 MG PO TABS
50.0000 mg | ORAL_TABLET | Freq: Every morning | ORAL | Status: DC
  Administered 2015-04-24: 50 mg via ORAL
  Filled 2015-04-23 (×2): qty 1

## 2015-04-23 MED ORDER — HYDROCODONE-ACETAMINOPHEN 5-325 MG PO TABS
1.0000 | ORAL_TABLET | ORAL | Status: DC | PRN
  Administered 2015-04-24: 1 via ORAL
  Filled 2015-04-23: qty 1

## 2015-04-23 MED ORDER — TRIAMTERENE-HCTZ 37.5-25 MG PO CAPS
1.0000 | ORAL_CAPSULE | Freq: Every morning | ORAL | Status: DC
  Filled 2015-04-23: qty 1

## 2015-04-23 MED ORDER — FENTANYL CITRATE (PF) 100 MCG/2ML IJ SOLN
25.0000 ug | INTRAMUSCULAR | Status: DC | PRN
  Administered 2015-04-23 (×2): 50 ug via INTRAVENOUS

## 2015-04-23 MED ORDER — ACETAMINOPHEN 650 MG RE SUPP: 650.0000 mg | Freq: Four times a day (QID) | RECTAL | Status: DC | PRN

## 2015-04-23 MED ORDER — BUPIVACAINE LIPOSOME 1.3 % IJ SUSP
20.0000 mL | Freq: Once | INTRAMUSCULAR | Status: AC
  Administered 2015-04-23: 20 mL
  Filled 2015-04-23: qty 20

## 2015-04-23 MED ORDER — PROPOFOL 10 MG/ML IV BOLUS
INTRAVENOUS | Status: AC
  Filled 2015-04-23: qty 20

## 2015-04-23 MED ORDER — LACTATED RINGERS IV SOLN
INTRAVENOUS | Status: DC
  Administered 2015-04-23 (×2): via INTRAVENOUS
  Administered 2015-04-23: 1000 mL via INTRAVENOUS

## 2015-04-23 MED ORDER — ROCURONIUM BROMIDE 100 MG/10ML IV SOLN
INTRAVENOUS | Status: AC
  Filled 2015-04-23: qty 1

## 2015-04-23 MED ORDER — FENTANYL CITRATE (PF) 100 MCG/2ML IJ SOLN
INTRAMUSCULAR | Status: DC | PRN
  Administered 2015-04-23 (×5): 50 ug via INTRAVENOUS

## 2015-04-23 MED ORDER — ROCURONIUM BROMIDE 100 MG/10ML IV SOLN
INTRAVENOUS | Status: DC | PRN
  Administered 2015-04-23: 10 mg via INTRAVENOUS
  Administered 2015-04-23: 30 mg via INTRAVENOUS
  Administered 2015-04-23: 5 mg via INTRAVENOUS
  Administered 2015-04-23: 20 mg via INTRAVENOUS
  Administered 2015-04-23 (×2): 5 mg via INTRAVENOUS

## 2015-04-23 MED ORDER — 0.9 % SODIUM CHLORIDE (POUR BTL) OPTIME
TOPICAL | Status: DC | PRN
  Administered 2015-04-23: 1000 mL

## 2015-04-23 MED ORDER — KCL IN DEXTROSE-NACL 20-5-0.45 MEQ/L-%-% IV SOLN
INTRAVENOUS | Status: DC
  Administered 2015-04-23: 1000 mL via INTRAVENOUS
  Administered 2015-04-24 (×2): via INTRAVENOUS
  Filled 2015-04-23 (×5): qty 1000

## 2015-04-23 MED ORDER — BUPIVACAINE-EPINEPHRINE (PF) 0.25% -1:200000 IJ SOLN
INTRAMUSCULAR | Status: AC
  Filled 2015-04-23: qty 30

## 2015-04-23 MED ORDER — CIPROFLOXACIN IN D5W 400 MG/200ML IV SOLN
400.0000 mg | Freq: Two times a day (BID) | INTRAVENOUS | Status: AC
  Administered 2015-04-23: 400 mg via INTRAVENOUS

## 2015-04-23 MED ORDER — LEVOTHYROXINE SODIUM 75 MCG PO TABS
75.0000 ug | ORAL_TABLET | Freq: Every day | ORAL | Status: DC
  Administered 2015-04-24 – 2015-04-25 (×2): 75 ug via ORAL
  Filled 2015-04-23 (×3): qty 1

## 2015-04-23 MED ORDER — ONDANSETRON HCL 4 MG/2ML IJ SOLN: 4.0000 mg | Freq: Four times a day (QID) | INTRAMUSCULAR | Status: DC | PRN

## 2015-04-23 MED ORDER — MIDAZOLAM HCL 2 MG/2ML IJ SOLN
INTRAMUSCULAR | Status: AC
  Filled 2015-04-23: qty 4

## 2015-04-23 MED ORDER — FENTANYL CITRATE (PF) 250 MCG/5ML IJ SOLN
INTRAMUSCULAR | Status: AC
  Filled 2015-04-23: qty 25

## 2015-04-23 MED ORDER — EPHEDRINE SULFATE 50 MG/ML IJ SOLN
INTRAMUSCULAR | Status: DC | PRN
  Administered 2015-04-23: 5 mg via INTRAVENOUS

## 2015-04-23 MED ORDER — ONDANSETRON HCL 4 MG/2ML IJ SOLN
INTRAMUSCULAR | Status: DC | PRN
  Administered 2015-04-23 (×2): 2 mg via INTRAVENOUS

## 2015-04-23 MED ORDER — DEXAMETHASONE SODIUM PHOSPHATE 10 MG/ML IJ SOLN
INTRAMUSCULAR | Status: DC | PRN
  Administered 2015-04-23: 10 mg via INTRAVENOUS

## 2015-04-23 MED ORDER — ZOLPIDEM TARTRATE 5 MG PO TABS
5.0000 mg | ORAL_TABLET | Freq: Every evening | ORAL | Status: DC | PRN
  Administered 2015-04-24 (×2): 5 mg via ORAL
  Filled 2015-04-23 (×2): qty 1

## 2015-04-23 MED ORDER — LIP MEDEX EX OINT
TOPICAL_OINTMENT | CUTANEOUS | Status: AC
  Administered 2015-04-23: 16:00:00
  Filled 2015-04-23: qty 7

## 2015-04-23 MED ORDER — SUGAMMADEX SODIUM 500 MG/5ML IV SOLN
INTRAVENOUS | Status: DC | PRN
  Administered 2015-04-23: 200 mg via INTRAVENOUS

## 2015-04-23 MED ORDER — LACTATED RINGERS IR SOLN
Status: DC | PRN
  Administered 2015-04-23: 1000 mL

## 2015-04-23 MED ORDER — LIDOCAINE HCL (CARDIAC) 20 MG/ML IV SOLN
INTRAVENOUS | Status: DC | PRN
  Administered 2015-04-23: 75 mg via INTRAVENOUS

## 2015-04-23 MED ORDER — SODIUM CHLORIDE 0.9 % IJ SOLN
INTRAMUSCULAR | Status: DC | PRN
  Administered 2015-04-23: 20 mL

## 2015-04-23 MED ORDER — MORPHINE SULFATE (PF) 2 MG/ML IV SOLN: 2.0000 mg | INTRAVENOUS | Status: DC | PRN

## 2015-04-23 MED ORDER — LIDOCAINE HCL (CARDIAC) 20 MG/ML IV SOLN
INTRAVENOUS | Status: AC
  Filled 2015-04-23: qty 5

## 2015-04-23 MED ORDER — ONDANSETRON 4 MG PO TBDP: 4.0000 mg | ORAL_TABLET | Freq: Four times a day (QID) | ORAL | Status: DC | PRN

## 2015-04-23 MED ORDER — SODIUM CHLORIDE 0.9 % IJ SOLN
INTRAMUSCULAR | Status: AC
  Filled 2015-04-23: qty 20

## 2015-04-23 MED ORDER — MEPERIDINE HCL 50 MG/ML IJ SOLN: 6.2500 mg | INTRAMUSCULAR | Status: DC | PRN

## 2015-04-23 MED ORDER — SUCCINYLCHOLINE CHLORIDE 20 MG/ML IJ SOLN
INTRAMUSCULAR | Status: DC | PRN
  Administered 2015-04-23: 100 mg via INTRAVENOUS

## 2015-04-23 MED ORDER — PANTOPRAZOLE SODIUM 40 MG PO TBEC
40.0000 mg | DELAYED_RELEASE_TABLET | Freq: Every day | ORAL | Status: DC
Start: 2015-04-24 — End: 2015-04-25
  Administered 2015-04-24: 40 mg via ORAL
  Filled 2015-04-23 (×2): qty 1

## 2015-04-23 MED ORDER — PROMETHAZINE HCL 25 MG/ML IJ SOLN: 6.2500 mg | INTRAMUSCULAR | Status: DC | PRN

## 2015-04-23 MED ORDER — CIPROFLOXACIN IN D5W 400 MG/200ML IV SOLN
INTRAVENOUS | Status: AC
  Filled 2015-04-23: qty 200

## 2015-04-23 MED ORDER — BUPIVACAINE-EPINEPHRINE 0.25% -1:200000 IJ SOLN
INTRAMUSCULAR | Status: DC | PRN
  Administered 2015-04-23: 9 mL

## 2015-04-23 MED ORDER — ONDANSETRON HCL 4 MG/2ML IJ SOLN
INTRAMUSCULAR | Status: AC
  Filled 2015-04-23: qty 2

## 2015-04-23 MED ORDER — ENOXAPARIN SODIUM 40 MG/0.4ML ~~LOC~~ SOLN
40.0000 mg | SUBCUTANEOUS | Status: DC
  Administered 2015-04-23 – 2015-04-24 (×2): 40 mg via SUBCUTANEOUS
  Filled 2015-04-23 (×4): qty 0.4

## 2015-04-23 SURGICAL SUPPLY — 43 items
BENZOIN TINCTURE PRP APPL 2/3 (GAUZE/BANDAGES/DRESSINGS) IMPLANT
BINDER ABDOMINAL 12 ML 46-62 (SOFTGOODS) ×2 IMPLANT
CHLORAPREP W/TINT 26ML (MISCELLANEOUS) ×2 IMPLANT
COVER SURGICAL LIGHT HANDLE (MISCELLANEOUS) ×2 IMPLANT
DECANTER SPIKE VIAL GLASS SM (MISCELLANEOUS) ×2 IMPLANT
DEVICE SECURE STRAP 25 ABSORB (INSTRUMENTS) ×2 IMPLANT
DEVICE TROCAR PUNCTURE CLOSURE (ENDOMECHANICALS) ×2 IMPLANT
DISSECTOR BLUNT TIP ENDO 5MM (MISCELLANEOUS) IMPLANT
DRAPE CAMERA CLOSED 9X96 (DRAPES) ×2 IMPLANT
DRAPE INCISE IOBAN 66X45 STRL (DRAPES) ×2 IMPLANT
DRAPE LAPAROSCOPIC ABDOMINAL (DRAPES) ×2 IMPLANT
ELECT REM PT RETURN 9FT ADLT (ELECTROSURGICAL) ×2
ELECTRODE REM PT RTRN 9FT ADLT (ELECTROSURGICAL) ×1 IMPLANT
GLOVE BIOGEL PI IND STRL 7.5 (GLOVE) ×1 IMPLANT
GLOVE BIOGEL PI INDICATOR 7.5 (GLOVE) ×1
GLOVE ECLIPSE 7.5 STRL STRAW (GLOVE) ×2 IMPLANT
GOWN STRL REUS W/TWL XL LVL3 (GOWN DISPOSABLE) ×6 IMPLANT
KIT BASIN OR (CUSTOM PROCEDURE TRAY) ×2 IMPLANT
LIQUID BAND (GAUZE/BANDAGES/DRESSINGS) ×2 IMPLANT
MESH VENTRALIGHT ST 6X8 (Mesh Specialty) ×1 IMPLANT
MESH VENTRLGHT ELLIPSE 8X6XMFL (Mesh Specialty) ×1 IMPLANT
NEEDLE SPNL 22GX3.5 QUINCKE BK (NEEDLE) ×2 IMPLANT
PEN SKIN MARKING BROAD (MISCELLANEOUS) ×2 IMPLANT
SCISSORS LAP 5X35 DISP (ENDOMECHANICALS) ×2 IMPLANT
SET IRRIG TUBING LAPAROSCOPIC (IRRIGATION / IRRIGATOR) IMPLANT
SHEARS HARMONIC ACE PLUS 36CM (ENDOMECHANICALS) ×2 IMPLANT
SLEEVE XCEL OPT CAN 5 100 (ENDOMECHANICALS) ×4 IMPLANT
SOLUTION ANTI FOG 6CC (MISCELLANEOUS) IMPLANT
STAPLER VISISTAT 35W (STAPLE) ×2 IMPLANT
STRIP CLOSURE SKIN 1/2X4 (GAUZE/BANDAGES/DRESSINGS) IMPLANT
SUT MNCRL AB 4-0 PS2 18 (SUTURE) ×2 IMPLANT
SUT NOVA NAB GS-21 0 18 T12 DT (SUTURE) ×6 IMPLANT
SUT PROLENE 0 CT 1 CR/8 (SUTURE) IMPLANT
SUT PROLENE 2 0 KS (SUTURE) ×4 IMPLANT
SUT VIC AB 2-0 CT2 27 (SUTURE) ×2 IMPLANT
TOWEL OR 17X26 10 PK STRL BLUE (TOWEL DISPOSABLE) ×2 IMPLANT
TOWEL OR NON WOVEN STRL DISP B (DISPOSABLE) ×2 IMPLANT
TRAY FOLEY W/METER SILVER 14FR (SET/KITS/TRAYS/PACK) ×2 IMPLANT
TRAY LAPAROSCOPIC (CUSTOM PROCEDURE TRAY) ×2 IMPLANT
TROCAR BLADELESS OPT 5 100 (ENDOMECHANICALS) ×2 IMPLANT
TROCAR XCEL BLUNT TIP 100MML (ENDOMECHANICALS) IMPLANT
TROCAR XCEL NON-BLD 11X100MML (ENDOMECHANICALS) IMPLANT
TUBING INSUFFLATION 10FT LAP (TUBING) ×2 IMPLANT

## 2015-04-23 NOTE — Anesthesia Postprocedure Evaluation (Signed)
  Anesthesia Post-op Note  Patient: Sophia Nash  Procedure(s) Performed: Procedure(s) (LRB): LAPAROSCOPIC ASSISTED INCISIONAL VENTRAL HERNIA REPAIR (N/A) INSERTION OF MESH (N/A)  Patient Location: PACU  Anesthesia Type: General  Level of Consciousness: awake and alert   Airway and Oxygen Therapy: Patient Spontanous Breathing  Post-op Pain: mild  Post-op Assessment: Post-op Vital signs reviewed, Patient's Cardiovascular Status Stable, Respiratory Function Stable, Patent Airway and No signs of Nausea or vomiting  Last Vitals:  Filed Vitals:   04/23/15 1721  BP: 147/68  Pulse:   Temp: 36.8 C  Resp: 18    Post-op Vital Signs: stable   Complications: No apparent anesthesia complications

## 2015-04-23 NOTE — Transfer of Care (Signed)
Immediate Anesthesia Transfer of Care Note  Patient: Sophia Nash  Procedure(s) Performed: Procedure(s): LAPAROSCOPIC ASSISTED INCISIONAL VENTRAL HERNIA REPAIR (N/A) INSERTION OF MESH (N/A)  Patient Location: PACU  Anesthesia Type:General  Level of Consciousness: awake, oriented, patient cooperative, lethargic and responds to stimulation  Airway & Oxygen Therapy: Patient Spontanous Breathing and Patient connected to face mask oxygen  Post-op Assessment: Report given to RN, Post -op Vital signs reviewed and stable and Patient moving all extremities  Post vital signs: Reviewed and stable  Last Vitals:  Filed Vitals:   04/23/15 0852  BP: 178/80  Pulse: 74  Temp: 36.6 C  Resp: 18    Complications: No apparent anesthesia complications

## 2015-04-23 NOTE — Op Note (Signed)
Preoperative Diagnosis: Recurrent ventral incisional hernia  Postoprative Diagnosis: Same  Procedure: Procedure(s): LAPAROSCOPIC ASSISTED INCISIONAL VENTRAL HERNIA REPAIR INSERTION OF MESH   Surgeon: [name] T   Assistants: None  Anesthesia:  General endotracheal anesthesia  Indications: Patient has a history of laparoscopic sigmoid colectomy with an extraction incision in the left lower quadrant. 2 years ago she underwent laparoscopic repair with mesh with primary closure of the hernia defect after developing a incisional hernia at the extraction site. She now presents with a recurrent moderate sized hernia in the left lower quadrant at the same site. I recommended likely laparoscopic and open combined repair with mesh. We discussed the nature of the surgery and risks in detail documented elsewhere and she is in agreement.    Procedure Detail:  Patient was brought to the operating room, placed in the supine position on the operating table, and general endotracheal anesthesia induced.  Foley catheter was placed. She received preoperative IV antibiotics. The abdomen and groins were widely sterilely prepped and draped. Patient timeout was performed and correct procedure verified. Laparoscopic access was obtained with a 5 mm Optiview trocar in the right upper quadrant without difficulty. Pneumoperitoneum was established. No evidence of trocar injury. Under direct vision to further 5 mm trochars were placed in the right mid abdomen and right lower quadrant. The hernia defect was immediately apparent in the left lower quadrant about 4 cm above the inguinal ligament. There was mesh superior to the defect and the defect had occurred inferior to the mesh. Omental adhesions were taken down from the edge of the hernia defect of the mesh. No bowel adhesions. The entire anterior abdominal wall was completely cleared. At this point I made a small incision directly over the defect which measured about  5 cm in length. The hernia sac was completely dissected away from subcutaneous tissue down to the level of the fascia and it was excised. A 6 x 8" piece of VentralLight mesh was chosen for repair. I placed 0 Novafil stay sutures along one side and both ends of the mesh leaving the other side clear so it could lie down into the pelvis below the anterior abdominal wall. The mesh was introduced through the incision into the peritoneum. I then closed the fascia primarily with interrupted #1 Novafil sutures. Following this the abdomen was reinsufflated. The mesh was oriented appropriately. The Endo Close device through small stab wounds was used to pull the sutures previously placed in the mesh up through the anterior abdominal wall deploying it nicely superiorly and medially and inferiorly over the repair. Laterally I then determined as far as I could go laterally and inferior laterally and not impinge upon the retroperitoneum and I passed several 2-0 Prolene sutures via Lanny Hurst needles through the abdominal wall into the abdomen and then through appropriate areas in the mesh away from the edge. The suture was divided and Lanny Hurst needle removed and then the suture retrieved with the Endo Close device and the mesh brought up lateral and inferior to the fascial repair with several more centimeters of mesh laying down into the pelvis. The mesh was circumferentially tacked with the [place] but just catching superficial peritoneum laterally and inferiorly right could not completely feel the tacker through the abdominal wall. This appeared to provide nice broad coverage of the repair with mesh. There was no evidence of bleeding or trocar injury. All CO2 was evacuated and trochars removed. Wounds were irrigated. Fascia was infiltrated with Exparel local anesthetic.  The subcutaneous at  the hernia site was closed with running 2-0 Vicryl. All skin was closed with subcuticular Monocryl and Liquiban. Sponge needle and instrument  counts were correct.    Findings: As above  Estimated Blood Loss:  Minimal         Drains: none  Blood Given: none          Specimens: None        Complications:  * No complications entered in OR log *         Disposition: PACU - hemodynamically stable.         Condition: stable

## 2015-04-23 NOTE — H&P (Signed)
History of Present Illness Sophia Nash T. Hoxworth MD; 01/17/2015 1:55 PM) Patient words: hernia.  The patient is a 80 year old female who presents with an incisional hernia. She has a history of laparoscopic sigmoid colectomy for diverticulitis with a left lower quadrant extraction incision. She presented with an incisional hernia at this site and exactly 2 years ago I did a combined open and laparoscopic repair with a 10 x 15 cm piece of intra-abdominal mesh and primary closure of the abdominal wall. She had done well but has had somewhat of a bulge in the area recently. She developed some left flank pain and saw a urologist in CT scan was performed. I reviewed this which shows a recurrent hernia containing omentum and some small bowel not obstructed through what appears to be a fairly small defect in the left lower quadrant. Sophia Nash left flank pain was never really explained. She says she does have some discomfort in the left lower quadrant particularly when Sophia Nash bladder is full and she tends to use the bathroom a lot to minimize this.   Other Problems Sophia Nash, RMA; 01/17/2015 1:31 PM) Anxiety Disorder Arthritis Back Pain Chest pain Depression Gastroesophageal Reflux Disease High blood pressure Hypercholesterolemia Inguinal Hernia Lump In Breast Thyroid Disease  Past Surgical History Sophia Nash, RMA; 01/17/2015 1:31 PM) Breast Biopsy Left. Breast Mass; Local Excision Left. Colon Polyp Removal - Colonoscopy Colon Polyp Removal - Open Colon Removal - Partial Gallbladder Surgery - Laparoscopic Laparoscopic Inguinal Hernia Surgery Left. Oral Surgery  Diagnostic Studies History Sophia Nash, RMA; 01/17/2015 1:31 PM) Colonoscopy 5-10 years ago Mammogram 1-3 years ago  Allergies Sophia Nash, RMA; 01/17/2015 1:35 PM) Penicillamine *ASSORTED CLASSES* Aspirin *ANALGESICS - NonNarcotic* Codeine Phosphate *ANALGESICS - OPIOID*  Medication History [name],  RMA; 01/17/2015 1:38 PM) Levothyroxine Sodium (75MCG Tablet, Oral daily) Active. Omeprazole (20MG  Capsule DR, Oral daily) Active. Sertraline HCl (100MG  Tablet, 1/2 pill Oral daily) Active. Multiple Vitamin (Oral daily) Active. Claritin (10MG  Capsule, Oral as needed) Active. Vitamin D (Ergocalciferol) (50000UNIT Capsule, Oral once a week) Active. Dyazide (37.5-25MG  Capsule, Oral daily) Active.  Social History Sophia Nash, RMA; 01/17/2015 1:31 PM) Alcohol use Occasional alcohol use. Caffeine use Carbonated beverages, Coffee. No drug use Tobacco use Never smoker.  Family History Sophia Nash, RMA; 01/17/2015 1:31 PM) Breast Cancer Mother. Colon Cancer Brother, Family Members In General. Depression Daughter, Mother. Heart Disease Father. Heart disease in female family member before age 1 Hypertension Father. Melanoma Brother. Rectal Cancer Brother. Thyroid problems Mother.  Pregnancy / Birth History Sophia Nash, [place]; 01/17/2015 1:31 PM) Age at menarche 11 years. Age of menopause >65 Gravida 65 Maternal age 10-35 Para 2  Review of Systems Sophia Nash RMA; 01/17/2015 1:31 PM) General Present- Fatigue and Night Sweats. Not Present- Appetite Loss, Chills, Fever, Weight Gain and Weight Loss. HEENT Present- Seasonal Allergies and Wears glasses/contact lenses. Not Present- Earache, Hearing Loss, Hoarseness, Nose Bleed, Oral Ulcers, Ringing in the Ears, Sinus Pain, Sore Throat, Visual Disturbances and Yellow Eyes. Respiratory Present- Snoring. Not Present- Bloody sputum, Chronic Cough, Difficulty Breathing and Wheezing. Breast Present- Skin Changes. Not Present- Breast Mass, Breast Pain and Nipple Discharge. Cardiovascular Present- Swelling of Extremities. Not Present- Chest Pain, Difficulty Breathing Lying Down, Leg Cramps, Palpitations, Rapid Heart Rate and Shortness of Breath. Gastrointestinal Present- Abdominal Pain, Bloating, Chronic diarrhea and Excessive gas.  Not Present- Bloody Stool, Change in Bowel Habits, Constipation, Difficulty Swallowing, Gets full quickly at meals, Hemorrhoids, Indigestion, Nausea, Rectal Pain and Vomiting. Female Genitourinary Present- Frequency, Nocturia  and Urgency. Not Present- Painful Urination and Pelvic Pain. Musculoskeletal Present- Back Pain and Swelling of Extremities. Not Present- Joint Pain, Joint Stiffness, Muscle Pain and Muscle Weakness. Neurological Present- Decreased Memory and Weakness. Not Present- Fainting, Headaches, Numbness, Seizures, Tingling, Tremor and Trouble walking. Psychiatric Present- Anxiety and Depression. Not Present- Bipolar, Change in Sleep Pattern, Fearful and Frequent crying. Endocrine Present- Hot flashes. Not Present- Cold Intolerance, Excessive Hunger, Hair Changes, Heat Intolerance and New Diabetes. Hematology Present- Easy Bruising. Not Present- Excessive bleeding, Gland problems, HIV and Persistent Infections.   Vitals (Robin Gwynn RMA; 01/17/2015 1:42 PM) 01/17/2015 1:38 PM Weight: 179.6 lb Height: 64.5in Body Surface Area: 1.93 m Body Mass Index: 30.35 kg/m Temp.: 98.93F  Pulse: 60 (Regular)  BP: 148/86 (Sitting, Left Arm, Standard)    Physical Exam Sophia Nash T. Hoxworth MD; 01/17/2015 2:05 PM) The physical exam findings are as follows: Note:General: Alert, moderately obese Caucasian female, in no distress Skin: Warm and dry without rash or infection. HEENT: No palpable masses or thyromegaly. Sclera nonicteric. Pupils equal round and reactive. Oropharynx clear. Lymph nodes: No cervical, supraclavicular, or inguinal nodes palpable. Lungs: Breath sounds clear and equal. No wheezing or increased work of breathing. Cardiovascular: Regular rate and rhythm without murmer. No JVD or edema. Peripheral pulses intact. No carotid bruits. Abdomen: Obese. Nondistended. Soft and nontender. No masses palpable. There is an obvious left lower quadrant hernia with quite a good  sized bulge with the patient standing. With Sophia Nash lying down I can feel a defect that probably measures approximately 4-5 cm. Extremities: No edema or joint swelling or deformity. No chronic venous stasis changes. Neurologic: Alert and fully oriented. Gait normal. No focal weakness. Psychiatric: Normal mood and affect. Thought content appropriate with normal judgement and insight    Assessment & Plan Sophia Nash T. Hoxworth MD; 01/17/2015 2:08 PM) RECURRENT VENTRAL INCISIONAL HERNIA (553.21  K43.2) Impression: Recurrent ventral incisional hernia in the left lower quadrant. This is symptomatic and significant sized and I think should be repaired. I discussed that with the patient and Sophia Nash family. I would recommend an open repair hopefully with a pre-peritoneal or recto rectus piece of mesh but we would be prepared possibly to look laparoscopically to put a large piece of mesh in. We discussed the nature of surgery, hospitalization and expected recovery and risks of bleeding, infection, of course recurrence, anesthetic complications or possible bowel injury. All questions were answered. We'll schedule this at their convenience. Current Plans  Schedule for Surgery Open and possible laparoscopic-assisted repair of recurrent ventral incisional hernia

## 2015-04-23 NOTE — Anesthesia Preprocedure Evaluation (Addendum)
Anesthesia Evaluation  Patient identified by MRN, date of birth, ID band Patient awake    Reviewed: Allergy & Precautions, NPO status , Patient's Chart, lab work & pertinent test results  Airway Mallampati: II  TM Distance: >3 FB Neck ROM: Full    Dental no notable dental hx.    Pulmonary neg pulmonary ROS,    Pulmonary exam normal breath sounds clear to auscultation       Cardiovascular hypertension, Pt. on medications Normal cardiovascular exam Rhythm:Regular Rate:Normal     Neuro/Psych negative neurological ROS  negative psych ROS   GI/Hepatic negative GI ROS, Neg liver ROS,   Endo/Other  Hypothyroidism   Renal/GU negative Renal ROS  negative genitourinary   Musculoskeletal negative musculoskeletal ROS (+)   Abdominal   Peds negative pediatric ROS (+)  Hematology negative hematology ROS (+)   Anesthesia Other Findings   Reproductive/Obstetrics negative OB ROS                            Anesthesia Physical Anesthesia Plan  ASA: II  Anesthesia Plan: General   Post-op Pain Management:    Induction: Intravenous  Airway Management Planned: Oral ETT  Additional Equipment:   Intra-op Plan:   Post-operative Plan: Extubation in OR  Informed Consent: I have reviewed the patients History and Physical, chart, labs and discussed the procedure including the risks, benefits and alternatives for the proposed anesthesia with the patient or authorized representative who has indicated his/her understanding and acceptance.   Dental advisory given  Plan Discussed with: CRNA  Anesthesia Plan Comments:         Anesthesia Quick Evaluation

## 2015-04-23 NOTE — Anesthesia Procedure Notes (Signed)
Procedure Name: Intubation Date/Time: 04/23/2015 10:46 AM Performed by: Ofilia Neas Pre-anesthesia Checklist: Patient identified, Emergency Drugs available, Suction available, Patient being monitored and Timeout performed Patient Re-evaluated:Patient Re-evaluated prior to inductionOxygen Delivery Method: Circle system utilized Preoxygenation: Pre-oxygenation with 100% oxygen Intubation Type: IV induction Ventilation: Mask ventilation without difficulty Laryngoscope Size: Mac and 3 Grade View: Grade II Tube type: Oral Tube size: 7.0 mm Number of attempts: 1 Airway Equipment and Method: Stylet Placement Confirmation: ETT inserted through vocal cords under direct vision,  positive ETCO2 and breath sounds checked- equal and bilateral Secured at: 20 cm Tube secured with: Tape Dental Injury: Teeth and Oropharynx as per pre-operative assessment

## 2015-04-23 NOTE — Interval H&P Note (Signed)
History and Physical Interval Note:  04/23/2015 10:05 AM  Sophia Nash  has presented today for surgery, with the diagnosis of incisional hernia  The various methods of treatment have been discussed with the patient and family. After consideration of risks, benefits and other options for treatment, the patient has consented to  Procedure(s): [place] (N/A) INSERTION OF MESH (N/A) as a surgical intervention .  [name] history has been reviewed, patient examined, no change in status, stable for surgery.  I have reviewed the patient's chart and labs.  Questions were answered to the patient's satisfaction.     HOXWORTH,BENJAMIN T

## 2015-04-24 MED ORDER — ACETAMINOPHEN 500 MG PO TABS: 1000.0000 mg | ORAL_TABLET | Freq: Four times a day (QID) | ORAL | Status: DC | PRN

## 2015-04-24 MED ORDER — TRIAMTERENE-HCTZ 37.5-25 MG PO TABS
1.0000 | ORAL_TABLET | Freq: Every day | ORAL | Status: DC
  Administered 2015-04-24: 1 via ORAL
  Filled 2015-04-24 (×4): qty 1

## 2015-04-24 MED ORDER — METOPROLOL TARTRATE 1 MG/ML IV SOLN
5.0000 mg | Freq: Four times a day (QID) | INTRAVENOUS | Status: DC | PRN
  Filled 2015-04-24: qty 5

## 2015-04-24 MED ORDER — HYDROCODONE-ACETAMINOPHEN 5-325 MG PO TABS: 1.0000 | ORAL_TABLET | ORAL | Status: DC | PRN

## 2015-04-24 NOTE — Progress Notes (Signed)
Pt's b/p is 190/63.  Dr Lucia Gaskins notified.  Order received for Lopressor IV prn.  Pt declined to take med stating that she wanted to take her Lorrin Mais and go to sleep and she was afraid of how she would react to the Lopressor.  Pt admits to being anxious tonight.  She asked for an extra dose of Zoloft 50mg  po stating that she takes 50mg  twice a day prn at home.  Order is for 50mg  once daily.  Offered to call dr. Lucia Gaskins again and  She said she would stick to the Ambien.

## 2015-04-24 NOTE — Care Management Note (Signed)
Case Management Note  Patient Details  Name: Sophia Nash MRN: [id number] Date of Birth: October 13, 1934  Subjective/Objective:    Lap ventral hernia repair                Action/Plan: Discharge planning  Expected Discharge Date:  04/25/15               Expected Discharge Plan:  Home/Self Care  In-House Referral:  NA  Discharge planning Services  CM Consult  Post Acute Care Choice:  NA Choice offered to:  NA  DME Arranged:  N/A DME Agency:  NA  HH Arranged:  NA HH Agency:  NA  Status of Service:  Completed, signed off  Medicare Important Message Given:    Date Medicare IM Given:    Medicare IM give by:    Date Additional Medicare IM Given:    Additional Medicare Important Message give by:     If discussed at [place] of Stay Meetings, dates discussed:    Additional Comments:  Guadalupe Maple, RN 04/24/2015, 11:09 AM

## 2015-04-24 NOTE — Progress Notes (Signed)
Patient ID: Sophia Nash, female   DOB: 12/09/1934, 80 y.o.   MRN: [id number] 1 Day Post-Op  Subjective: Up out of bed. Only mild discomfort. Denies nausea. Voiding okay with Foley out. Starting clear liquid diet.  Objective: Vital signs in last 24 hours: Temp:  [97.6 F (36.4 C)-98.7 F (37.1 C)] 97.9 F (36.6 C) (09/22 0540) Pulse Rate:  [64-85] 71 (09/22 0540) Resp:  [15-50] 18 (09/22 0540) BP: (127-178)/(54-80) 127/65 mmHg (09/22 0540) SpO2:  [96 %-100 %] 100 % (09/22 0540) Weight:  [81.251 kg (179 lb 2 oz)] 81.251 kg (179 lb 2 oz) (09/21 0927)    Intake/Output from previous day: 09/21 0701 - 09/22 0700 In: 3547.5 [P.O.:600; I.V.:2947.5] Out: 1800 [Urine:1800] Intake/Output this shift:    General appearance: alert, cooperative and no distress GI: normal findings: soft, non-tender Incision/Wound: clean and dry  Lab Results:  No results for input(s): WBC, HGB, HCT, PLT in the last 72 hours. BMET No results for input(s): NA, K, CL, CO2, GLUCOSE, BUN, CREATININE, CALCIUM in the last 72 hours.   Studies/Results: No results found.  Anti-infectives: Anti-infectives    Start     Dose/Rate Route Frequency Ordered Stop   04/23/15 1000  ciprofloxacin (CIPRO) IVPB 400 mg     400 mg 200 mL/hr over 60 Minutes Intravenous Every 12 hours 04/23/15 0909 04/23/15 1035      Assessment/Plan: s/p Procedure(s): LAPAROSCOPIC ASSISTED INCISIONAL VENTRAL HERNIA REPAIR INSERTION OF MESH Doing well without apparent complication. Anticipate discharge tomorrow.   LOS: 1 day    HOXWORTH,BENJAMIN T 04/24/2015

## 2015-04-25 MED ORDER — TRIAMTERENE-HCTZ 37.5-25 MG PO CAPS: 1.0000 | ORAL_CAPSULE | Freq: Every morning | ORAL | Status: DC

## 2015-04-25 MED ORDER — LORATADINE 10 MG PO TABS
10.0000 mg | ORAL_TABLET | Freq: Every day | ORAL | Status: DC | PRN
  Filled 2015-04-25: qty 1

## 2015-04-25 MED ORDER — METHOCARBAMOL 750 MG PO TABS: 750.0000 mg | ORAL_TABLET | Freq: Four times a day (QID) | ORAL | Status: DC | PRN

## 2015-04-25 MED ORDER — VITAMIN D (ERGOCALCIFEROL) 1.25 MG (50000 UNIT) PO CAPS: 50000.0000 [IU] | ORAL_CAPSULE | ORAL | Status: DC

## 2015-04-25 MED ORDER — FLUTICASONE PROPIONATE 50 MCG/ACT NA SUSP
2.0000 | Freq: Every day | NASAL | Status: DC
  Filled 2015-04-25: qty 16

## 2015-04-25 MED ORDER — METOPROLOL TARTRATE 12.5 MG HALF TABLET
12.5000 mg | ORAL_TABLET | Freq: Two times a day (BID) | ORAL | Status: DC | PRN
Start: 2015-04-25 — End: 2015-04-25
  Filled 2015-04-25: qty 1

## 2015-04-25 MED ORDER — METHOCARBAMOL 1000 MG/10ML IJ SOLN
1000.0000 mg | Freq: Four times a day (QID) | INTRAVENOUS | Status: DC | PRN
  Filled 2015-04-25: qty 10

## 2015-04-25 MED ORDER — OXYCODONE HCL 5 MG PO TABS: 5.0000 mg | ORAL_TABLET | ORAL | Status: DC | PRN

## 2015-04-25 MED ORDER — MELATONIN 5 MG PO TABS: 5.0000 mg | ORAL_TABLET | Freq: Every evening | ORAL | Status: DC | PRN

## 2015-04-25 MED ORDER — SERTRALINE HCL 100 MG PO TABS
100.0000 mg | ORAL_TABLET | Freq: Every morning | ORAL | Status: DC
  Filled 2015-04-25: qty 1

## 2015-04-25 MED ORDER — ADULT MULTIVITAMIN W/MINERALS CH
0.5000 | ORAL_TABLET | Freq: Every day | ORAL | Status: DC
  Filled 2015-04-25: qty 0.5

## 2015-04-25 NOTE — Progress Notes (Signed)

## 2015-04-25 NOTE — Discharge Instructions (Signed)
HERNIA REPAIR: POST OP INSTRUCTIONS  1. DIET: Follow a light bland diet the first 24 hours after arrival home, such as soup, liquids, crackers, etc.  Be sure to include lots of fluids daily.  Avoid fast food or heavy meals as your are more likely to get nauseated.  Eat a low fat the next few days after surgery. 2. Take your usually prescribed home medications unless otherwise directed. 3. PAIN CONTROL: a. Pain is best controlled by a usual combination of three different methods TOGETHER: i. Ice/Heat ii. Over the counter pain medication iii. Prescription pain medication b. Most patients will experience some swelling and bruising around the hernia(s) such as the bellybutton, groins, or old incisions.  Ice packs or heating pads (30-60 minutes up to 6 times a day) will help. Use ice for the first few days to help decrease swelling and bruising, then switch to heat to help relax tight/sore spots and speed recovery.  Some people prefer to use ice alone, heat alone, alternating between ice & heat.  Experiment to what works for you.  Swelling and bruising can take several weeks to resolve.   c. It is helpful to take an over-the-counter pain medication regularly for the first few weeks.  Choose one of the following that works best for you: i. Naproxen (Aleve, etc)  Two 225m tabs twice a day ii. Ibuprofen (Advil, etc) Three 2035mtabs four times a day (every meal & bedtime) iii. Acetaminophen (Tylenol, etc) 325-65046mour times a day (every meal & bedtime) d. A  prescription for pain medication should be given to you upon discharge.  Take your pain medication as prescribed.  i. If you are having problems/concerns with the prescription medicine (does not control pain, nausea, vomiting, rash, itching, etc), please call us [phone] see if we need to switch you to a different pain medicine that will work better for you and/or control your side effect better. ii. If you need a refill on your pain  medication, please contact your pharmacy.  They will contact our office to request authorization. Prescriptions will not be filled after 5 pm or on week-ends. 4. Avoid getting constipated.  Between the surgery and the pain medications, it is common to experience some constipation.  Increasing fluid intake and taking a fiber supplement (such as Metamucil, Citrucel, FiberCon, MiraLax, etc) 1-2 times a day regularly will usually help prevent this problem from occurring.  A mild laxative (prune juice, Milk of Magnesia, MiraLax, etc) should be taken according to package directions if there are no bowel movements after 48 hours.   5. Wash / shower every day.  You may shower over the dressings as they are waterproof.   6. Remove your waterproof bandages 5 days after surgery.  You may leave the incision open to air.  You may replace a dressing/Band-Aid to cover the incision for comfort if you wish.  Continue to shower over incision(s) after the dressing is off.    7. ACTIVITIES as tolerated:   a. You may resume regular (light) daily activities beginning the next day--such as daily self-care, walking, climbing stairs--gradually increasing activities as tolerated.  If you can walk 30 minutes without difficulty, it is safe to try more intense activity such as jogging, treadmill, bicycling, low-impact aerobics, swimming, etc. b. Save the most intensive and strenuous activity for last such as sit-ups, heavy lifting, contact sports, etc  Refrain from any heavy lifting or straining until you are off narcotics for pain control.  c. DO NOT PUSH THROUGH PAIN.  Let pain be your guide: If it hurts to do something, don't do it.  Pain is your body warning you to avoid that activity for another week until the pain goes down. d. You may drive when you are no longer taking prescription pain medication, you can comfortably wear a seatbelt, and you can safely maneuver your car and apply brakes. e. Dennis Bast may have sexual intercourse  when it is comfortable.  8. FOLLOW UP in our office a. Please call [facility] at [phone] to set up an appointment to see your surgeon in the office for a follow-up appointment approximately 2-3 weeks after your surgery. b. Make sure that you call for this appointment the day you arrive home to insure a convenient appointment time. 9.  IF YOU HAVE DISABILITY OR FAMILY LEAVE FORMS, BRING THEM TO THE OFFICE FOR PROCESSING.  DO NOT GIVE THEM TO YOUR DOCTOR.  WHEN TO CALL us [phone]: 1. Poor pain control 2. Reactions / problems with new medications (rash/itching, nausea, etc)  3. Fever over 101.5 F (38.5 C) 4. Inability to urinate 5. Nausea and/or vomiting 6. Worsening swelling or bruising 7. Continued bleeding from incision. 8. Increased pain, redness, or drainage from the incision   The clinic staff is available to answer your questions during regular business hours (8:30am-5pm).  Please dont hesitate to call and ask to speak to one of our nurses for clinical concerns.   If you have a medical emergency, go to the nearest emergency room or call 911.  A surgeon from [facility] is always on call at the hospitals in [facility], [place], [place], [place], [place]  [address] ?  [address] [address], [place], [place]   [address] MAIN: [phone] ? TOLL FREE: [phone] ? FAX: [phone] [email or web address]  Managing Pain  Pain after surgery or related to activity is often due to strain/injury to muscle, tendon, nerves and/or incisions.  This pain is usually short-term and will improve in a few months.   Many people find it helpful to do the following things TOGETHER to help speed the process of healing and to get back to regular activity more quickly:  1. Avoid heavy physical activity at first a. No lifting greater than 20 pounds at first, then increase to lifting as tolerated over the next few weeks b. Do not push  through the pain.  Listen to your body and avoid positions and maneuvers than reproduce the pain.  Wait a few days before trying something more intense c. Walking is okay as tolerated, but go slowly and stop when getting sore.  If you can walk 30 minutes without stopping or pain, you can try more intense activity (running, jogging, aerobics, cycling, swimming, treadmill, sex, sports, weightlifting, etc ) d. Remember: If it hurts to do it, then dont do it!  2. Take Anti-inflammatory medication a. Choose ONE of the following over-the-counter medications: i.            Acetaminophen 556m tabs (Tylenol) 1-2 pills with every meal and just before bedtime (avoid if you have liver problems) ii.            Naproxen 2220mtabs (ex. Aleve) 1-2 pills twice a day (avoid if you have kidney, stomach, IBD, or bleeding problems) iii. Ibuprofen 20038mabs (ex. Advil, Motrin) 3-4 pills with every meal and just before bedtime (avoid if you have kidney, stomach, IBD, or bleeding  problems) °b. Take with food/snack around the clock for 1-2 weeks °i. This helps the muscle and nerve tissues become less irritable and calm down faster ° °3. Use a Heating pad or Ice/Cold Pack °a. 4-6 times a day °b. May use warm bath/hottub  or showers ° °4. Try Gentle Massage and/or Stretching  °a. at the area of pain many times a day °b. stop if you feel pain - do not overdo it ° °Try these steps together to help you body heal faster and avoid making things get worse.  Doing just one of these things may not be enough.   ° °If you are not getting better after two weeks or are noticing you are getting worse, contact our office for further advice; we may need to re-evaluate you & see what other things we can do to help. ° °GETTING TO GOOD BOWEL HEALTH. °Irregular bowel habits such as constipation and diarrhea can lead to many problems over time.  Having one soft bowel movement a day is the most important way to prevent further problems.  The  anorectal canal is designed to handle stretching and feces to safely manage our ability to get rid of solid waste (feces, poop, stool) out of our body.  BUT, hard constipated stools can act like ripping concrete bricks and diarrhea can be a burning fire to this very sensitive area of our body, causing inflamed hemorrhoids, anal fissures, increasing risk is perirectal abscesses, abdominal pain/bloating, an making irritable bowel worse.     ° °The goal: ONE SOFT BOWEL MOVEMENT A DAY!  To have soft, regular bowel movements:  °• Drink plenty of fluids, consider 4-6 tall glasses of water a day.   °• Take plenty of fiber.  Fiber is the undigested part of plant food that passes into the colon, acting s “natures broom” to encourage bowel motility and movement.  Fiber can absorb and hold large amounts of water. This results in a larger, bulkier stool, which is soft and easier to pass. Work gradually over several weeks up to 6 servings a day of fiber (25g a day even more if needed) in the form of: °o Vegetables -- Root (potatoes, carrots, turnips), leafy green (lettuce, salad greens, celery, spinach), or cooked high residue (cabbage, broccoli, etc) °o Fruit -- Fresh (unpeeled skin & pulp), Dried (prunes, apricots, cherries, etc ),  or stewed ( applesauce)  °o Whole grain breads, pasta, etc (whole wheat)  °o Bran cereals  °• Bulking Agents -- This type of water-retaining fiber generally is easily obtained each day by one of the following:  °o Psyllium bran -- The psyllium plant is remarkable because its ground seeds can retain so much water. This product is available as Metamucil, Konsyl, Effersyllium, Per Diem Fiber, or the less expensive generic preparation in drug and health food stores. Although labeled a laxative, it really is not a laxative.  °o Methylcellulose -- This is another fiber derived from wood which also retains water. It is available as Citrucel. °o Polyethylene Glycol - and “artificial” fiber commonly called  Miralax or Glycolax.  It is helpful for people with gassy or bloated feelings with regular fiber °o Flax Seed - a less gassy fiber than psyllium °• No reading or other relaxing activity while on the toilet. If bowel movements take longer than 5 minutes, you are too constipated °• AVOID CONSTIPATION.  High fiber and water intake usually takes care of this.  Sometimes a laxative is needed to stimulate more frequent   bowel movements, but   Laxatives are not a good long-term solution as it can wear the colon out.  They can help jump-start bowels if constipated, but should be relied on constantly without discussing with your doctor o Osmotics (Milk of Magnesia, Fleets phosphosoda, Magnesium citrate, MiraLax, GoLytely) are safer than  o Stimulants (Senokot, Castor Oil, Dulcolax, Ex Lax)    o Avoid taking laxatives for more than 7 days in a row.   IF SEVERELY CONSTIPATED, try a Bowel Retraining Program: o Do not use laxatives.  o Eat a diet high in roughage, such as bran cereals and leafy vegetables.  o Drink six (6) ounces of prune or apricot juice each morning.  o Eat two (2) large servings of stewed fruit each day.  o Take one (1) heaping tablespoon of a psyllium-based bulking agent twice a day. Use sugar-free sweetener when possible to avoid excessive calories.  o Eat a normal breakfast.  o Set aside 15 minutes after breakfast to sit on the toilet, but do not strain to have a bowel movement.  o If you do not have a bowel movement by the third day, use an enema and repeat the above steps.   Controlling diarrhea o Switch to liquids and simpler foods for a few days to avoid stressing your intestines further. o Avoid dairy products (especially milk & ice cream) for a short time.  The intestines often can lose the ability to digest lactose when stressed. o Avoid foods that cause gassiness or bloating.  Typical foods include beans and other legumes, cabbage, broccoli, and dairy foods.  Every person has  some sensitivity to other foods, so listen to our body and avoid those foods that trigger problems for you. o Adding fiber (Citrucel, Metamucil, psyllium, Miralax) gradually can help thicken stools by absorbing excess fluid and retrain the intestines to act more normally.  Slowly increase the dose over a few weeks.  Too much fiber too soon can backfire and cause cramping & bloating. o Probiotics (such as active yogurt, Align, etc) may help repopulate the intestines and colon with normal bacteria and calm down a sensitive digestive tract.  Most studies show it to be of mild help, though, and such products can be costly. o Medicines: - Bismuth subsalicylate (ex. Kayopectate, Pepto Bismol) every 30 minutes for up to 6 doses can help control diarrhea.  Avoid if pregnant. - Loperamide (Immodium) can slow down diarrhea.  Start with two tablets (65m total) first and then try one tablet every 6 hours.  Avoid if you are having fevers or severe pain.  If you are not better or start feeling worse, stop all medicines and call your doctor for advice o Call your doctor if you are getting worse or not better.  Sometimes further testing (cultures, endoscopy, X-ray studies, bloodwork, etc) may be needed to help diagnose and treat the cause of the diarrhea.  TROUBLESHOOTING IRREGULAR BOWELS 1) Avoid extremes of bowel movements (no bad constipation/diarrhea) 2) Miralax 17gm mixed in 8oz. water or juice-daily. May use BID as needed.  3) Gas-x,Phazyme, etc. as needed for gas & bloating.  4) Soft,bland diet. No spicy,greasy,fried foods.  5) Prilosec over-the-counter as needed  6) May hold gluten/wheat products from diet to see if symptoms improve.  7)  May try probiotics (Align, Activa, etc) to help calm the bowels down 7) If symptoms become worse call back immediately.  Hernia Repair with Laparoscope A hernia occurs when an internal organ pushes out through a weak  spot in the belly (abdominal) wall muscles. Hernias  most commonly occur in the groin and around the navel. Hernias can also occur through a cut by the surgeon (incision) after an abdominal operation. A hernia may be caused by:  Lifting heavy objects.  Prolonged coughing.  Straining to move your bowels. Hernias can often be pushed back into place (reduced). Most hernias tend to get worse over time. Problems occur when abdominal contents get stuck in the opening and the blood supply is blocked or impaired (incarcerated hernia). Because of these risks, you require surgery to repair the hernia. Your hernia will be repaired using a laparoscope. Laparoscopic surgery is a type of minimally invasive surgery. It does not involve making a typical surgical cut (incision) in the skin. A laparoscope is a telescope-like rod and lens system. It is usually connected to a video camera and a light source so your caregiver can clearly see the operative area. The instruments are inserted through  to  inch (5 mm or 10 mm) openings in the skin at specific locations. A working and viewing space is created by blowing a small amount of carbon dioxide gas into the abdominal cavity. The abdomen is essentially blown up like a balloon (insufflated). This elevates the abdominal wall above the internal organs like a dome. The carbon dioxide gas is common to the human body and can be absorbed by tissue and removed by the respiratory system. Once the repair is completed, the small incisions will be closed with either stitches (sutures) or staples (just like a paper stapler only this staple holds the skin together). LET YOUR CAREGIVERS KNOW ABOUT:  Allergies.  Medications taken including herbs, eye drops, over the counter medications, and creams.  Use of steroids (by mouth or creams).  Previous problems with anesthetics or Novocaine.  Possibility of pregnancy, if this applies.  History of blood clots (thrombophlebitis).  History of bleeding or blood problems.  Previous  surgery.  Other health problems. BEFORE THE PROCEDURE  Laparoscopy can be done either in a hospital or out-patient clinic. You may be given a mild sedative to help you relax before the procedure. Once in the operating room, you will be given a general anesthesia to make you sleep (unless you and your caregiver choose a different anesthetic).  AFTER THE PROCEDURE  After the procedure you will be watched in a recovery area. Depending on what type of hernia was repaired, you might be admitted to the hospital or you might go home the same day. With this procedure you may have less pain and scarring. This usually results in a quicker recovery and less risk of infection. HOME CARE INSTRUCTIONS   Bed rest is not required. You may continue your normal activities but avoid heavy lifting (more than 10 pounds) or straining.  Cough gently. If you are a smoker it is best to stop, as even the best hernia repair can break down with the continual strain of coughing.  Avoid driving until given the OK by your surgeon.  There are no dietary restrictions unless given otherwise.  TAKE ALL MEDICATIONS AS DIRECTED.  Only take over-the-counter or prescription medicines for pain, discomfort, or fever as directed by your caregiver. SEEK MEDICAL CARE IF:   There is increasing abdominal pain or pain in your incisions.  There is more bleeding from incisions, other than minimal spotting.  You feel light headed or faint.  You develop an unexplained fever, chills, and/or an oral temperature above 102 F (38.9 C).  You have redness, swelling, or increasing pain in the wound.  Pus coming from wound.  A foul smell coming from the wound or dressings. SEEK IMMEDIATE MEDICAL CARE IF:   You develop a rash.  You have difficulty breathing.  You have any allergic problems. MAKE SURE YOU:   Understand these instructions.  Will watch your condition.  Will get help right away if you are not doing well or get  worse.  Document Released: 07/19/2005 Document Revised: 10/11/2011 Document Reviewed: 06/18/2009 [facility] Patient Information 2015 [place], [place]. This information is not intended to replace advice given to you by your health care provider. Make sure you discuss any questions you have with your health care provider.  Exercise to Stay Healthy Exercise helps you become and stay healthy. EXERCISE IDEAS AND TIPS Choose exercises that:  You enjoy.  Fit into your day. You do not need to exercise really hard to be healthy. You can do exercises at a slow or medium level and stay healthy. You can:  Stretch before and after working out.  Try yoga, Pilates, or tai chi.  Lift weights.  Walk fast, swim, jog, run, climb stairs, bicycle, dance, or rollerskate.  Take aerobic classes. Exercises that burn about 150 calories:  Running 1  miles in 15 minutes.  Playing volleyball for 45 to 60 minutes.  Washing and waxing a car for 45 to 60 minutes.  Playing touch football for 45 minutes.  Walking 1  miles in 35 minutes.  Pushing a stroller 1  miles in 30 minutes.  Playing basketball for 30 minutes.  Raking leaves for 30 minutes.  Bicycling 5 miles in 30 minutes.  Walking 2 miles in 30 minutes.  Dancing for 30 minutes.  Shoveling snow for 15 minutes.  Swimming laps for 20 minutes.  Walking up stairs for 15 minutes.  Bicycling 4 miles in 15 minutes.  Gardening for 30 to 45 minutes.  Jumping rope for 15 minutes.  Washing windows or floors for 45 to 60 minutes. Document Released: 08/21/2010 Document Revised: 10/11/2011 Document Reviewed: 08/21/2010 [facility] Patient Information 2015 [place], [place]. This information is not intended to replace advice given to you by your health care provider. Make sure you discuss any questions you have with your health care provider.

## 2015-04-25 NOTE — Discharge Summary (Signed)
Physician Discharge Summary  Patient ID: Sophia Nash MRN: [id number] DOB/AGE: 1935-02-18 79 y.o.  Admit date: 04/23/2015 Discharge date: 04/25/2015  Patient Care Team: Levy Pupa, MD as PCP - General (Family Medicine)  Admission Diagnoses: Active Problems:   Recurrent ventral incisional hernia   Discharge Diagnoses:  Active Problems:   Recurrent ventral incisional hernia   POST-OPERATIVE DIAGNOSIS:   incisional hernia  SURGERY:  04/23/2015  Procedure(s): LAPAROSCOPIC ASSISTED INCISIONAL VENTRAL HERNIA REPAIR INSERTION OF MESH  SURGEON:    Surgeon(s): [name], MD  Consults: None  Hospital Course:   The patient underwent the surgery above.  Postoperatively, the patient gradually mobilized and advanced to a solid diet.  Pain and other symptoms were treated aggressively.    By the time of discharge, the patient was walking well the hallways, eating food, having flatus.  Pain was well-controlled on an oral medications.  Based on meeting discharge criteria and continuing to recover, I felt it was safe for the patient to be discharged from the hospital to further recover with close followup. Postoperative recommendations were discussed in detail.  They are written as well.   Significant Diagnostic Studies:  No results found for this or any previous visit (from the past 72 hour(s)).  No results found.  Discharge Exam: Blood pressure 133/65, pulse 66, temperature 99 F (37.2 C), temperature source Oral, resp. rate 17, height 5' 5.5" (1.664 m), weight 81.251 kg (179 lb 2 oz), SpO2 96 %.  General: Pt awake/alert/oriented x4 in no major acute distress.  Smiling Eyes: PERRL, normal EOM. Sclera nonicteric Neuro: CN II-XII intact w/o focal sensory/motor deficits. Lymph: No head/neck/groin lymphadenopathy Psych:  No delerium/psychosis/paranoia HENT: Normocephalic, Mucus membranes moist.  No thrush Neck: Supple, No tracheal deviation Chest: No pain.  Good  respiratory excursion. CV:  Pulses intact.  Regular rhythm MS: Normal AROM mjr joints.  No obvious deformity Abdomen: Soft, Nondistended.  Min tender.  No incarcerated hernias. Ext:  SCDs BLE.  No significant edema.  No cyanosis Skin: No petechiae / purpura  Discharged Condition: good   Past Medical History  Diagnosis Date  . Breast lump     left  . Thyroid disease     hypothyroidism  . Atypical moles   . Colon polyp   . Allergy   . Diverticulitis     04-15-15 no recent issues  . Edema     legs, hands  bilateral  . Depression   . Mild cognitive impairment with memory loss 2002    per Dr Berdine Addison, neuro, 04-15-15 no changes-"stable"  . Recurrent upper respiratory infection (URI)     cold with sinus drainage x several months- saw PCP 10/08/11- chest x ray report on chart.  no fever,resolved, no recent issues as of 04-15-15.  Marland Kitchen Hypertension   . Hypothyroidism   . GERD (gastroesophageal reflux disease)     hx of   . Anemia   . Impaired hearing     bilaterally some hearing loss-no hearing aids  . Shoulder joint pain     04-07-15 "tripped-loss balance, fell awkwardly and "wrenched shoulder-left", Known "bad right shoulder"bone on bone"  . Skin lesion of breast     hx of right lateral breast  lesion-tx. with multipe rounds oral antibiotic- resolved, no reoccurrence.  . Arthritis     Past Surgical History  Procedure Laterality Date  . Tubal ligation    . Breast surgery      lumpectomy- left-benign  . Cholecystectomy  laparoscopic  . Esophagogastroduodenoscopy    . Colonoscopy    . Colectomy  10/20/11    sigmoid colectomy  . Incisional hernia repair N/A 01/12/2013    Procedure: LAPAROSCOPIC/OPEN REPAIR INCISIONAL HERNIA;  Surgeon: [name], MD;  Location: [facility];  Service: General;  Laterality: N/A;  With Mesh  . Hernia repair    . Cataract extraction, bilateral Bilateral   . Incisional hernia repair N/A 04/23/2015    Procedure: LAPAROSCOPIC ASSISTED INCISIONAL  VENTRAL HERNIA REPAIR;  Surgeon: [name], MD;  Location: [facility];  Service: General;  Laterality: N/A;  . Insertion of mesh N/A 04/23/2015    Procedure: INSERTION OF MESH;  Surgeon: [name], MD;  Location: [facility];  Service: General;  Laterality: N/A;    Social History   Social History  . Marital Status: Married    Spouse Name: N/A  . Number of Children: N/A  . Years of Education: N/A   Occupational History  . Not on file.   Social History Main Topics  . Smoking status: Never Smoker   . Smokeless tobacco: Never Used  . Alcohol Use: No  . Drug Use: No  . Sexual Activity: Not on file   Other Topics Concern  . Not on file   Social History Narrative    Family History  Problem Relation Age of Onset  . Cancer Mother     breast  . Heart disease Father   . Hypertension Sister   . Cancer Brother     colon    Current Facility-Administered Medications  Medication Dose Route Frequency Provider Last Rate Last Dose  . acetaminophen (TYLENOL) tablet 1,000 mg  1,000 mg Oral Q6H PRN Alphonsa Overall, MD      . enoxaparin (LOVENOX) injection 40 mg  40 mg Subcutaneous Q24H [name], MD   40 mg at 04/24/15 2051  . fluticasone (FLONASE) 50 MCG/ACT nasal spray 2 spray  2 spray Each Nare Daily Michael Boston, MD      . HYDROcodone-acetaminophen (NORCO/VICODIN) 5-325 MG per tablet 1-2 tablet  1-2 tablet Oral Q4H PRN [name], MD   1 tablet at 04/24/15 1430  . levothyroxine (SYNTHROID, LEVOTHROID) tablet 75 mcg  75 mcg Oral QAC breakfast [name], MD   75 mcg at 04/25/15 [phone]  . loratadine (CLARITIN) tablet 10 mg  10 mg Oral Daily PRN Michael Boston, MD      . methocarbamol (ROBAXIN) 1,000 mg in dextrose 5 % 50 mL IVPB  1,000 mg Intravenous Q6H PRN Michael Boston, MD      . metoprolol (LOPRESSOR) injection 5 mg  5 mg Intravenous Q6H PRN Alphonsa Overall, MD      . metoprolol tartrate (LOPRESSOR) tablet 12.5 mg  12.5 mg Oral Q12H PRN Michael Boston, MD      .  morphine 2 MG/ML injection 2-4 mg  2-4 mg Intravenous Q1H PRN [name], MD      . multivitamin with minerals tablet 0.5 tablet  0.5 tablet Oral Daily Michael Boston, MD      . ondansetron (ZOFRAN-ODT) disintegrating tablet 4 mg  4 mg Oral Q6H PRN [name], MD       Or  . ondansetron (ZOFRAN) injection 4 mg  4 mg Intravenous Q6H PRN [name], MD      . pantoprazole (PROTONIX) EC tablet 40 mg  40 mg Oral Daily [name], MD   40 mg at 04/24/15 0940  . sertraline (ZOLOFT) tablet 100 mg  100 mg Oral  q morning - 10a Michael Boston, MD      . triamterene-hydrochlorothiazide [facility]) 37.5-25 MG per tablet 1 tablet  1 tablet Oral Daily [name], MD   1 tablet at 04/24/15 0940  . [START ON 04/29/2015] Vitamin D (Ergocalciferol) (DRISDOL) capsule 50,000 Units  50,000 Units Oral Q7 days Michael Boston, MD      . zolpidem [facility]) tablet 5 mg  5 mg Oral QHS PRN [name], MD   5 mg at 04/24/15 2222     Allergies  Allergen Reactions  . Penicillins Anaphylaxis  . Aspirin     Burns stomach  . Codeine     Makes pt overly excited.... Anxious/ hyperactive     Disposition: 01-Home or Self Care  Discharge Instructions    Call MD for:  extreme fatigue    Complete by:  As directed      Call MD for:  hives    Complete by:  As directed      Call MD for:  persistant nausea and vomiting    Complete by:  As directed      Call MD for:  redness, tenderness, or signs of infection (pain, swelling, redness, odor or green/yellow discharge around incision site)    Complete by:  As directed      Call MD for:  severe uncontrolled pain    Complete by:  As directed      Call MD for:    Complete by:  As directed   Temperature > 101.11F     Diet - low sodium heart healthy    Complete by:  As directed      Discharge instructions    Complete by:  As directed   Please see discharge instruction sheets.  Also refer to handout given an office.  Please call our office if you  have any questions or concerns [phone]     Discharge wound care:    Complete by:  As directed   If you have closed incisions, shower and bathe over these incisions with soap and water every day.  Remove all surgical dressings on postoperative day #3.  You do not need to replace dressings over the closed incisions unless you feel more comfortable with a Band-Aid covering it.   If you have an open wound that requires packing, please see wound care instructions.  In general, remove all dressings, wash wound with soap and water and then replace with saline moistened gauze.  Do the dressing change at least every day.  Please call our office [phone] if you have further questions.     Driving Restrictions    Complete by:  As directed   No driving until off narcotics and can safely swerve away without pain during an emergency     Increase activity slowly    Complete by:  As directed   Walk an hour a day.  Use 20-30 minute walks.  When you can walk 30 minutes without difficulty, it is fine to restart low impact/moderate activities such as biking, jogging, swimming, sexual activity, etc.  Eventually you can increase to unrestricted activity when not feeling pain.  If you feel pain: STOP!Marland Kitchen   Let pain protect you from overdoing it.  Use ice/heat & over-the-counter pain medications to help minimize soreness.  If that is not enough, then use your narcotic pain prescription as needed to remain active.  It is better to take extra pain medications and be more active than to stay bedridden to avoid  all pain medications.     Lifting restrictions    Complete by:  As directed   Avoid heavy lifting initially.  Do not push through pain.  You have no specific weight limit - if it hurts to do, DON'T DO IT.   If you feel no pain, you are not injuring anything.  Pain will protect you from injury.  Coughing and sneezing are far more stressful to your incision than any lifting.  Avoid resuming heavy lifting / intense  activity until off all narcotic pain medications.  When ready to exercise more, give yourself 2 weeks to gradually get back to full intense exercise/activity.     May shower / Bathe    Complete by:  As directed      May walk up steps    Complete by:  As directed      Sexual Activity Restrictions    Complete by:  As directed   Sexual activity as tolerated.  Do not push through pain.  Pain will protect you from injury.     Walk with assistance    Complete by:  As directed   Walk over an hour a day.  May use a walker/cane/companion to help with balance and stamina.            Medication List    STOP taking these medications        oxyCODONE-acetaminophen 5-325 MG per tablet  Commonly known as:  PERCOCET/ROXICET      TAKE these medications        CALCIUM-CARB 600 PO  Take 1 tablet by mouth every morning.     fluticasone 50 MCG/ACT nasal spray  Commonly known as:  FLONASE  Place 2 sprays into the nose daily as needed for rhinitis.     levothyroxine 75 MCG tablet  Commonly known as:  SYNTHROID, LEVOTHROID  Take 75 mcg by mouth daily before breakfast.     loratadine 10 MG tablet  Commonly known as:  CLARITIN  Take 10 mg by mouth daily as needed for allergies.     Melatonin 5 MG Tabs  Take 5 mg by mouth at bedtime as needed (for sleep).     methocarbamol 750 MG tablet  Commonly known as:  ROBAXIN  Take 1 tablet (750 mg total) by mouth 4 (four) times daily as needed (use for muscle cramps/pain).     multivitamin with minerals Tabs tablet  Take 0.5 tablets by mouth daily.     omeprazole 20 MG capsule  Commonly known as:  PRILOSEC  Take 20 mg by mouth daily.     oxyCODONE 5 MG immediate release tablet  Commonly known as:  Oxy IR/ROXICODONE  Take 1-2 tablets (5-10 mg total) by mouth every 4 (four) hours as needed for moderate pain, severe pain or breakthrough pain.     sertraline 50 MG tablet  Commonly known as:  ZOLOFT  Take 50 mg by mouth every morning.      triamterene-hydrochlorothiazide 37.5-25 MG per capsule  Commonly known as:  DYAZIDE  Take 1 capsule by mouth every morning.     Vitamin D (Ergocalciferol) 50000 UNITS Caps capsule  Commonly known as:  DRISDOL  Take 50,000 Units by mouth every 7 (seven) days.           Follow-up Information    Follow up with HOXWORTH,BENJAMIN T, MD. Schedule an appointment as soon as possible for a visit in 3 weeks.   Specialty:  General Surgery   Why:  To follow up after your operation, To follow up after your hospital stay   Contact information:   [address] [place] [address] [phone]        Signed: Morton Peters, M.D., F.A.C.S. Gastrointestinal and Minimally Invasive Surgery [facility] [address], [place], [place] [address] [phone] Main / Paging   04/25/2015, 7:49 AM

## 2015-04-25 NOTE — Progress Notes (Signed)
Discharge instructions given to patient along with prescriptions.  Questions answered 

## 2016-08-02 HISTORY — PX: COLONOSCOPY: SHX174

## 2019-08-03 HISTORY — PX: DG  BONE DENSITY (ARMC HX): HXRAD1102

## 2019-12-12 ENCOUNTER — Telehealth: Payer: Self-pay

## 2019-12-12 NOTE — Telephone Encounter (Signed)
NOTES ON FILE FROM [facility] [phone], SENT REFERRAL TO SCHEDULING

## 2020-01-14 ENCOUNTER — Telehealth: Payer: Self-pay | Admitting: Cardiology

## 2020-01-14 NOTE — Telephone Encounter (Signed)
Add on for 01/15/20 at 8:20am with Dr. Gardiner Rhyme.

## 2020-01-15 ENCOUNTER — Encounter: Payer: Self-pay | Admitting: Cardiology

## 2020-01-15 ENCOUNTER — Other Ambulatory Visit: Payer: Self-pay

## 2020-01-15 ENCOUNTER — Ambulatory Visit: Payer: Medicare HMO | Admitting: Cardiology

## 2020-01-15 VITALS — BP 125/80 | HR 78 | Temp 95.7°F | Ht 64.0 in | Wt 188.6 lb

## 2020-01-15 DIAGNOSIS — E785 Hyperlipidemia, unspecified: Secondary | ICD-10-CM

## 2020-01-15 DIAGNOSIS — R011 Cardiac murmur, unspecified: Secondary | ICD-10-CM | POA: Diagnosis not present

## 2020-01-15 DIAGNOSIS — I10 Essential (primary) hypertension: Secondary | ICD-10-CM | POA: Diagnosis not present

## 2020-01-15 DIAGNOSIS — R079 Chest pain, unspecified: Secondary | ICD-10-CM

## 2020-01-15 NOTE — Progress Notes (Signed)
Cardiology Office Note:    Date:  01/15/2020   ID:  Sophia Nash, DOB 25-Sep-1934, MRN [id number]  PCP:  Sophia Lew, PA-C  Cardiologist:  No primary care provider on file.  Electrophysiologist:  None   Referring MD: Sophia Pupa, MD   Chief Complaint  Patient presents with  . Chest Pain    History of Present Illness:    Sophia Nash is a 85 y.o. female with a hx of hypertension, hypothyroidism, hyperlipidemia who is referred by Sophia Nash for evaluation of heart murmur. Was found to have a heart murmur and underwent recent echocardiogram at [facility] (records not available). She reported an episode of chest pain on 01/13/2020. States that she was watching church on television and began having pain over her left breast. Described as dull aching pain, 6 out of 10 in intensity. Pain lasted for 2 hours. She took clonazepam with no improvement. She then took Cymbalta and took a nap. States that when she woke up the pain was gone. She denies any chest pain since that time. She is not exercising regularly, but was doing recumbent bicycle up until 2 months ago. She would ride for 20 minutes. Denied any exertional chest pain or dyspnea. Denies any palpitations or syncope but reports occasional lightheadedness, particularly with standing. No smoking history. Family history includes brother was recently diagnosed with cardiomyopathy (she is unclear with details) and her father died of MI at age 29.    Past Medical History:  Diagnosis Date  . Allergy   . Anemia   . Arthritis   . Atypical moles   . Breast lump    left  . Colon polyp   . Depression   . Diverticulitis    04-15-15 no recent issues  . Edema    legs, hands  bilateral  . GERD (gastroesophageal reflux disease)    hx of   . Hypertension   . Hypothyroidism   . Impaired hearing    bilaterally some hearing loss-no hearing aids  . Mild cognitive impairment with memory loss 2002   per Dr Berdine Addison, neuro, 04-15-15 no  changes-"stable"  . Recurrent upper respiratory infection (URI)    cold with sinus drainage x several months- saw PCP 10/08/11- chest x ray report on chart.  no fever,resolved, no recent issues as of 04-15-15.  Marland Kitchen Shoulder joint pain    04-07-15 "tripped-loss balance, fell awkwardly and "wrenched shoulder-left", Known "bad right shoulder"bone on bone"  . Skin lesion of breast    hx of right lateral breast  lesion-tx. with multipe rounds oral antibiotic- resolved, no reoccurrence.  . Thyroid disease    hypothyroidism    Past Surgical History:  Procedure Laterality Date  . BREAST SURGERY     lumpectomy- left-benign  . CATARACT EXTRACTION, BILATERAL Bilateral   . CHOLECYSTECTOMY     laparoscopic  . COLECTOMY  10/20/11   sigmoid colectomy  . COLONOSCOPY    . ESOPHAGOGASTRODUODENOSCOPY    . HERNIA REPAIR    . INCISIONAL HERNIA REPAIR N/A 01/12/2013   Procedure: LAPAROSCOPIC/OPEN REPAIR INCISIONAL HERNIA;  Surgeon: [name], MD;  Location: [facility];  Service: General;  Laterality: N/A;  With Mesh  . INCISIONAL HERNIA REPAIR N/A 04/23/2015   Procedure: LAPAROSCOPIC ASSISTED INCISIONAL VENTRAL HERNIA REPAIR;  Surgeon: [name], MD;  Location: [facility];  Service: General;  Laterality: N/A;  . INSERTION OF MESH N/A 04/23/2015   Procedure: INSERTION OF MESH;  Surgeon: [name], MD;  Location: [facility];  Service: General;  Laterality: N/A;  . TUBAL LIGATION      Current Medications: Current Meds  Medication Sig  . celecoxib (CELEBREX) 100 MG capsule Take 100 mg by mouth 2 (two) times daily.  Marland Kitchen dicyclomine (BENTYL) 10 MG/5ML solution Take by mouth in the morning and at bedtime.  . DULoxetine (CYMBALTA) 60 MG capsule Take 60 mg by mouth daily.  . Multiple Vitamins-Minerals (PRESERVISION AREDS 2+MULTI VIT PO) Take 150 mg by mouth 2 (two) times daily.     Allergies:   Penicillins, Aspirin, and Codeine   Social History   Socioeconomic History  . Marital status: Married     Spouse name: Not on file  . Number of children: Not on file  . Years of education: Not on file  . Highest education level: Not on file  Occupational History  . Not on file  Tobacco Use  . Smoking status: Never Smoker  . Smokeless tobacco: Never Used  Substance and Sexual Activity  . Alcohol use: No  . Drug use: No  . Sexual activity: Not on file  Other Topics Concern  . Not on file  Social History Narrative  . Not on file   Social Determinants of Health   Financial Resource Strain:   . Difficulty of Paying Living Expenses:   Food Insecurity:   . Worried About Charity fundraiser in the Last Year:   . Arboriculturist in the Last Year:   Transportation Needs:   . Film/video editor (Medical):   Marland Kitchen Lack of Transportation (Non-Medical):   Physical Activity:   . Days of Exercise per Week:   . Minutes of Exercise per Session:   Stress:   . Feeling of Stress :   Social Connections:   . Frequency of Communication with Friends and Family:   . Frequency of Social Gatherings with Friends and Family:   . Attends Religious Services:   . Active Member of Clubs or Organizations:   . Attends Archivist Meetings:   Marland Kitchen Marital Status:      Family History: [name] family history includes Cancer in her brother and mother; Heart disease in her father; Hypertension in her sister.  ROS:   Please see the history of present illness.    All other systems reviewed and are negative.  EKGs/Labs/Other Studies Reviewed:    The following studies were reviewed today:   EKG:  EKG is ordered today.  The ekg ordered today demonstrates normal sinus rhythm, rate 78, subtle diffuse ST depressions  Recent Labs: No results found for requested labs within last 8760 hours.  Recent Lipid Panel No results found for: CHOL, TRIG, HDL, CHOLHDL, VLDL, LDLCALC, LDLDIRECT  Physical Exam:    VS:  BP 125/80   Pulse 78   Temp (!) 95.7 F (35.4 C)   Ht 5\' 4"  (1.626 m)   Wt 188 lb 9.6  oz (85.5 kg)   SpO2 96%   BMI 32.37 kg/m     Wt Readings from Last 3 Encounters:  01/15/20 188 lb 9.6 oz (85.5 kg)  04/23/15 179 lb 2 oz (81.3 kg)  04/15/15 179 lb 2 oz (81.3 kg)     GEN:  in no acute distress HEENT: Normal NECK: No JVD; No carotid bruits CARDIAC: RRR, 2 out of 6 systolic murmur loudest at RUSB  RESPIRATORY:  Clear to auscultation without rales, wheezing or rhonchi  ABDOMEN: Soft, non-tender, non-distended MUSCULOSKELETAL:  No edema; No deformity  SKIN: Warm  and dry NEUROLOGIC:  Alert and oriented x 3 PSYCHIATRIC:  Normal affect   ASSESSMENT:    1. Chest pain of uncertain etiology   2. Heart murmur   3. Essential hypertension   4. Hyperlipidemia, unspecified hyperlipidemia type    PLAN:    Chest pain: Atypical in description, but does have significant CAD risk factors (age, hypertension, hyperlipidemia, family history). Will evaluate for ischemia with Lexiscan Myoview  Heart murmur: We will obtain records of recent echo at [facility]  Hypertension: On amlodipine 5 mg daily and triamterene-hydrochlorothiazide 37.5-25 mg. Appears controlled  Hyperlipidemia: LDL 157 on 10/10/2019.  Not on a statin.  Will check calcium score for further risk stratification.  RTC in 1 month  Medication Adjustments/Labs and Tests Ordered: Current medicines are reviewed at length with the patient today.  Concerns regarding medicines are outlined above.  Orders Placed This Encounter  Procedures  . CT CARDIAC SCORING  . MYOCARDIAL PERFUSION IMAGING  . EKG 12-Lead   No orders of the defined types were placed in this encounter.   Patient Instructions  Medication Instructions:  No changes *If you need a refill on your cardiac medications before your next appointment, please call your pharmacy*   Lab Work: None If you have labs (blood work) drawn today and your tests are completely normal, you will receive your results only by: Marland Kitchen MyChart Message (if you have MyChart)  OR . A paper copy in the mail If you have any lab test that is abnormal or we need to change your treatment, we will call you to review the results.   Testing/Procedures: Your physician has requested that you have a lexiscan myoview. For further information please visit [email or web address]. Please follow instruction sheet, as given.     CT coronary calcium score. This test is done at [address]. This is $150 out of pocket.   Coronary CalciumScan A coronary calcium scan is an imaging test used to look for deposits of calcium and other fatty materials (plaques) in the inner lining of the blood vessels of the heart (coronary arteries). These deposits of calcium and plaques can partly clog and narrow the coronary arteries without producing any symptoms or warning signs. This puts a person at risk for a heart attack. This test can detect these deposits before symptoms develop. Tell a health care provider about:  Any allergies you have.  All medicines you are taking, including vitamins, herbs, eye drops, creams, and over-the-counter medicines.  Any problems you or family members have had with anesthetic medicines.  Any blood disorders you have.  Any surgeries you have had.  Any medical conditions you have.  Whether you are pregnant or may be pregnant. What are the risks? Generally, this is a safe procedure. However, problems may occur, including:  Harm to a pregnant woman and her unborn baby. This test involves the use of radiation. Radiation exposure can be dangerous to a pregnant woman and her unborn baby. If you are pregnant, you generally should not have this procedure done.  Slight increase in the risk of cancer. This is because of the radiation involved in the test. What happens before the procedure? No preparation is needed for this procedure. What happens during the procedure?  You will undress and remove any jewelry around your neck or chest.  You will  put on a hospital gown.  Sticky electrodes will be placed on your chest. The electrodes will be connected to an electrocardiogram (ECG) machine to record  a tracing of the electrical activity of your heart.  A CT scanner will take pictures of your heart. During this time, you will be asked to lie still and hold your breath for 2-3 seconds while a picture of your heart is being taken. The procedure may vary among health care providers and hospitals. What happens after the procedure?  You can get dressed.  You can return to your normal activities.  It is up to you to get the results of your test. Ask your health care provider, or the department that is doing the test, when your results will be ready. Summary  A coronary calcium scan is an imaging test used to look for deposits of calcium and other fatty materials (plaques) in the inner lining of the blood vessels of the heart (coronary arteries).  Generally, this is a safe procedure. Tell your health care provider if you are pregnant or may be pregnant.  No preparation is needed for this procedure.  A CT scanner will take pictures of your heart.  You can return to your normal activities after the scan is done. This information is not intended to replace advice given to you by your health care provider. Make sure you discuss any questions you have with your health care provider. Document Released: 01/15/2008 Document Revised: 06/07/2016 Document Reviewed: 06/07/2016 Elsevier Interactive Patient Education  2017 [place]: At [facility], you and your health needs are our priority.  As part of our continuing mission to provide you with exceptional heart care, we have created designated Provider Care Teams.  These Care Teams include your primary Cardiologist (physician) and Advanced Practice Providers (APPs -  Physician Assistants and Nurse Practitioners) who all work together to provide you with the care you need, when  you need it.  We recommend signing up for the patient portal called "MyChart".  Sign up information is provided on this After Visit Summary.  MyChart is used to connect with patients for Virtual Visits (Telemedicine).  Patients are able to view lab/test results, encounter notes, upcoming appointments, etc.  Non-urgent messages can be sent to your provider as well.   To learn more about what you can do with MyChart, go to [email or web address].    Your next appointment:   1 month(s)  The format for your next appointment:   In Person  Provider:   Oswaldo Milian, MD   Other Instructions     Signed, Donato Heinz, MD  01/15/2020 11:42 PM    [place]

## 2020-01-15 NOTE — Patient Instructions (Signed)
Medication Instructions:  No changes *If you need a refill on your cardiac medications before your next appointment, please call your pharmacy*   Lab Work: None If you have labs (blood work) drawn today and your tests are completely normal, you will receive your results only by:  [place] (if you have MyChart) OR  A paper copy in the mail If you have any lab test that is abnormal or we need to change your treatment, we will call you to review the results.   Testing/Procedures: Your physician has requested that you have a lexiscan myoview. For further information please visit [email or web address]. Please follow instruction sheet, as given.     CT coronary calcium score. This test is done at [address]. This is $150 out of pocket.   Coronary CalciumScan A coronary calcium scan is an imaging test used to look for deposits of calcium and other fatty materials (plaques) in the inner lining of the blood vessels of the heart (coronary arteries). These deposits of calcium and plaques can partly clog and narrow the coronary arteries without producing any symptoms or warning signs. This puts a person at risk for a heart attack. This test can detect these deposits before symptoms develop. Tell a health care provider about:  Any allergies you have.  All medicines you are taking, including vitamins, herbs, eye drops, creams, and over-the-counter medicines.  Any problems you or family members have had with anesthetic medicines.  Any blood disorders you have.  Any surgeries you have had.  Any medical conditions you have.  Whether you are pregnant or may be pregnant. What are the risks? Generally, this is a safe procedure. However, problems may occur, including:  Harm to a pregnant woman and her unborn baby. This test involves the use of radiation. Radiation exposure can be dangerous to a pregnant woman and her unborn baby. If you are pregnant, you generally  should not have this procedure done.  Slight increase in the risk of cancer. This is because of the radiation involved in the test. What happens before the procedure? No preparation is needed for this procedure. What happens during the procedure?  You will undress and remove any jewelry around your neck or chest.  You will put on a hospital gown.  Sticky electrodes will be placed on your chest. The electrodes will be connected to an electrocardiogram (ECG) machine to record a tracing of the electrical activity of your heart.  A CT scanner will take pictures of your heart. During this time, you will be asked to lie still and hold your breath for 2-3 seconds while a picture of your heart is being taken. The procedure may vary among health care providers and hospitals. What happens after the procedure?  You can get dressed.  You can return to your normal activities.  It is up to you to get the results of your test. Ask your health care provider, or the department that is doing the test, when your results will be ready. Summary  A coronary calcium scan is an imaging test used to look for deposits of calcium and other fatty materials (plaques) in the inner lining of the blood vessels of the heart (coronary arteries).  Generally, this is a safe procedure. Tell your health care provider if you are pregnant or may be pregnant.  No preparation is needed for this procedure.  A CT scanner will take pictures of your heart.  You can return to your normal activities  after the scan is done. This information is not intended to replace advice given to you by your health care provider. Make sure you discuss any questions you have with your health care provider. Document Released: 01/15/2008 Document Revised: 06/07/2016 Document Reviewed: 06/07/2016 Elsevier Interactive Patient Education  2017 [place]: At [facility], you and your health needs are our priority.  As part  of our continuing mission to provide you with exceptional heart care, we have created designated Provider Care Teams.  These Care Teams include your primary Cardiologist (physician) and Advanced Practice Providers (APPs -  Physician Assistants and Nurse Practitioners) who all work together to provide you with the care you need, when you need it.  We recommend signing up for the patient portal called "MyChart".  Sign up information is provided on this After Visit Summary.  MyChart is used to connect with patients for Virtual Visits (Telemedicine).  Patients are able to view lab/test results, encounter notes, upcoming appointments, etc.  Non-urgent messages can be sent to your provider as well.   To learn more about what you can do with MyChart, go to [email or web address].    Your next appointment:   1 month(s)  The format for your next appointment:   In Person  Provider:   Oswaldo Milian, MD   Other Instructions

## 2020-01-23 ENCOUNTER — Telehealth (HOSPITAL_COMMUNITY): Payer: Self-pay | Admitting: *Deleted

## 2020-01-23 NOTE — Telephone Encounter (Signed)
Left message on voicemail per DPR in reference to upcoming appointment scheduled on 01/28/2020 at 43 with detailed instructions given per Myocardial Perfusion Study Information Sheet for the test. LM to arrive 15 minutes early, and that it is imperative to arrive on time for appointment to keep from having the test rescheduled. If you need to cancel or reschedule your appointment, please call the office within 24 hours of your appointment. Failure to do so may result in a cancellation of your appointment, and a $50 no show fee. Phone number given for call back for any questions.  No mychart available.Saide Lanuza, Ranae Palms

## 2020-01-28 ENCOUNTER — Ambulatory Visit (INDEPENDENT_AMBULATORY_CARE_PROVIDER_SITE_OTHER)
Admission: RE | Admit: 2020-01-28 | Discharge: 2020-01-28 | Disposition: A | Discharge status: Home / Self Care | Payer: Self-pay | Source: Ambulatory Visit | Attending: Cardiology | Admitting: Cardiology

## 2020-01-28 ENCOUNTER — Ambulatory Visit (HOSPITAL_COMMUNITY): Payer: Medicare HMO | Attending: Cardiology

## 2020-01-28 ENCOUNTER — Other Ambulatory Visit: Payer: Self-pay

## 2020-01-28 DIAGNOSIS — E785 Hyperlipidemia, unspecified: Secondary | ICD-10-CM | POA: Diagnosis present

## 2020-01-28 DIAGNOSIS — R079 Chest pain, unspecified: Secondary | ICD-10-CM

## 2020-01-28 LAB — MYOCARDIAL PERFUSION IMAGING
LV dias vol: 58 mL (ref 46–106)
LV sys vol: 19 mL
Peak HR: 88 {beats}/min
Rest HR: 75 {beats}/min
SDS: 0
SRS: 0
SSS: 0
TID: 0.96

## 2020-01-28 MED ORDER — REGADENOSON 0.4 MG/5ML IV SOLN
0.4000 mg | Freq: Once | INTRAVENOUS | Status: AC
  Administered 2020-01-28: 0.4 mg via INTRAVENOUS

## 2020-01-28 MED ORDER — TECHNETIUM TC 99M TETROFOSMIN IV KIT
10.9000 | PACK | Freq: Once | INTRAVENOUS | Status: AC | PRN
  Administered 2020-01-28: 10.9 via INTRAVENOUS
  Filled 2020-01-28: qty 11

## 2020-01-28 MED ORDER — TECHNETIUM TC 99M TETROFOSMIN IV KIT
32.2000 | PACK | Freq: Once | INTRAVENOUS | Status: AC | PRN
  Administered 2020-01-28: 32.2 via INTRAVENOUS
  Filled 2020-01-28: qty 33

## 2020-01-31 ENCOUNTER — Telehealth: Payer: Self-pay | Admitting: Cardiology

## 2020-01-31 DIAGNOSIS — I499 Cardiac arrhythmia, unspecified: Secondary | ICD-10-CM

## 2020-01-31 DIAGNOSIS — R011 Cardiac murmur, unspecified: Secondary | ICD-10-CM

## 2020-01-31 HISTORY — DX: Cardiac arrhythmia, unspecified: I49.9

## 2020-01-31 HISTORY — DX: Cardiac murmur, unspecified: R01.1

## 2020-01-31 MED ORDER — ATORVASTATIN CALCIUM 20 MG PO TABS
20.0000 mg | ORAL_TABLET | Freq: Every day | ORAL | 3 refills | Status: DC
Start: 2020-01-31 — End: 2020-04-10

## 2020-01-31 NOTE — Telephone Encounter (Signed)
Patient calling for results of her CT and stress test. Please advise.

## 2020-01-31 NOTE — Telephone Encounter (Signed)
Reviewed pt's stress and CT results and recommendations per Dr. Gardiner Rhyme. "Plaque is seen in coronary arteries (calcium score 235, percentile 62 for age/gender). If patient is agreeable, would start statin. Would start with atorvastatin 20 mg daily"  Sophia Nash agrees with plan. She plans on discussing surgical clearance for joint replacement with Dr. Gardiner Rhyme at her next office visit as well. She has no additional needs at this time.

## 2020-02-20 ENCOUNTER — Ambulatory Visit: Payer: Medicare HMO | Admitting: Cardiology

## 2020-02-25 ENCOUNTER — Ambulatory Visit: Payer: Medicare HMO | Admitting: Cardiology

## 2020-03-28 ENCOUNTER — Telehealth: Payer: Self-pay

## 2020-03-28 NOTE — Telephone Encounter (Signed)
   [facility] Pre-operative Risk Assessment     Request for surgical clearance:  1. What type of surgery is being performed? LEFT TOTAL HIP REPLACEMENT   2. When is this surgery scheduled? 05-13-2020   3. What type of clearance is required (medical clearance vs. Pharmacy clearance to hold med vs. Both)? MEDICAL  4. Are there any medications that need to be held prior to surgery and how long?NONE   5. Practice name and name of physician performing surgery? [facility] [place]   6. What is the office phone number? [phone]   7.   What is the office fax number? [phone]  8.   Anesthesia type (None, local, MAC, general) ? CHOICE

## 2020-03-28 NOTE — Telephone Encounter (Signed)
   Primary Cardiologist: Dr.Schuman  Chart reviewed as part of pre-operative protocol coverage. Given past medical history and time since last visit, based on ACC/AHA guidelines, Sophia Nash would be at acceptable risk for the planned procedure without further cardiovascular testing. Low risk stress test 01/28/2020  I will route this recommendation to the requesting party via [facility] fax function and remove from pre-op pool.  Please call with questions.  Phill Myron. Vonn Sliger DNP, ANP, AACC  03/28/2020, 2:02 PM

## 2020-04-10 ENCOUNTER — Telehealth: Payer: Self-pay | Admitting: Cardiology

## 2020-04-10 MED ORDER — ROSUVASTATIN CALCIUM 5 MG PO TABS
5.0000 mg | ORAL_TABLET | Freq: Every day | ORAL | 3 refills | Status: DC
Start: 2020-04-10 — End: 2022-02-16

## 2020-04-10 NOTE — Telephone Encounter (Signed)
Pt updated and verbalized understanding. New orders placed.  

## 2020-04-10 NOTE — Telephone Encounter (Signed)
Recommend switching to rosuvastatin 5 mg daily

## 2020-04-10 NOTE — Telephone Encounter (Signed)
     Pt c/o medication issue:  1. Name of Medication:   atorvastatin (LIPITOR) 20 MG tablet     2. How are you currently taking this medication (dosage and times per day)? Take 1 tablet (20 mg total) by mouth daily.  3. Are you having a reaction (difficulty breathing--STAT)?   4. What is your medication issue? Pt said she is having bad joint pain. She would like to ask Dr. Gardiner Rhyme if he can give her a different statin

## 2020-04-10 NOTE — Telephone Encounter (Signed)
Patient states that she would like to switch to a different cholesterol medication. She states that ever since she started taking atorvastatin she has had bad joint pains especially in her knees.  Advised that I would send a message to Dr. Gardiner Rhyme for recommendations.

## 2020-05-05 ENCOUNTER — Ambulatory Visit: Payer: Medicare HMO | Admitting: Cardiology

## 2020-07-29 ENCOUNTER — Ambulatory Visit: Payer: Medicare HMO | Admitting: Cardiology

## 2020-08-02 HISTORY — PX: TOTAL HIP ARTHROPLASTY: SHX124

## 2020-08-30 DIAGNOSIS — G4733 Obstructive sleep apnea (adult) (pediatric): Secondary | ICD-10-CM | POA: Diagnosis not present

## 2020-08-30 DIAGNOSIS — G4710 Hypersomnia, unspecified: Secondary | ICD-10-CM | POA: Diagnosis not present

## 2020-09-04 DIAGNOSIS — Z471 Aftercare following joint replacement surgery: Secondary | ICD-10-CM | POA: Diagnosis not present

## 2020-09-04 DIAGNOSIS — Z96642 Presence of left artificial hip joint: Secondary | ICD-10-CM | POA: Diagnosis not present

## 2020-09-24 DIAGNOSIS — R82998 Other abnormal findings in urine: Secondary | ICD-10-CM | POA: Diagnosis not present

## 2020-09-24 DIAGNOSIS — E559 Vitamin D deficiency, unspecified: Secondary | ICD-10-CM | POA: Diagnosis not present

## 2020-09-24 DIAGNOSIS — R8290 Unspecified abnormal findings in urine: Secondary | ICD-10-CM | POA: Diagnosis not present

## 2020-09-24 DIAGNOSIS — F3342 Major depressive disorder, recurrent, in full remission: Secondary | ICD-10-CM | POA: Diagnosis not present

## 2020-09-24 DIAGNOSIS — F419 Anxiety disorder, unspecified: Secondary | ICD-10-CM | POA: Diagnosis not present

## 2020-09-24 DIAGNOSIS — N1831 Chronic kidney disease, stage 3a: Secondary | ICD-10-CM | POA: Diagnosis not present

## 2020-09-24 DIAGNOSIS — I10 Essential (primary) hypertension: Secondary | ICD-10-CM | POA: Diagnosis not present

## 2020-09-24 DIAGNOSIS — E039 Hypothyroidism, unspecified: Secondary | ICD-10-CM | POA: Diagnosis not present

## 2020-09-24 DIAGNOSIS — Z0000 Encounter for general adult medical examination without abnormal findings: Secondary | ICD-10-CM | POA: Diagnosis not present

## 2020-09-24 DIAGNOSIS — M2550 Pain in unspecified joint: Secondary | ICD-10-CM | POA: Diagnosis not present

## 2020-09-24 DIAGNOSIS — E782 Mixed hyperlipidemia: Secondary | ICD-10-CM | POA: Diagnosis not present

## 2020-09-24 DIAGNOSIS — R69 Illness, unspecified: Secondary | ICD-10-CM | POA: Diagnosis not present

## 2020-09-26 DIAGNOSIS — Z1231 Encounter for screening mammogram for malignant neoplasm of breast: Secondary | ICD-10-CM | POA: Diagnosis not present

## 2020-10-01 DIAGNOSIS — H04123 Dry eye syndrome of bilateral lacrimal glands: Secondary | ICD-10-CM | POA: Diagnosis not present

## 2020-10-01 DIAGNOSIS — H353132 Nonexudative age-related macular degeneration, bilateral, intermediate dry stage: Secondary | ICD-10-CM | POA: Diagnosis not present

## 2020-12-02 DIAGNOSIS — H35363 Drusen (degenerative) of macula, bilateral: Secondary | ICD-10-CM | POA: Diagnosis not present

## 2020-12-02 DIAGNOSIS — H353131 Nonexudative age-related macular degeneration, bilateral, early dry stage: Secondary | ICD-10-CM | POA: Diagnosis not present

## 2020-12-02 DIAGNOSIS — H04123 Dry eye syndrome of bilateral lacrimal glands: Secondary | ICD-10-CM | POA: Diagnosis not present

## 2020-12-02 DIAGNOSIS — H02834 Dermatochalasis of left upper eyelid: Secondary | ICD-10-CM | POA: Diagnosis not present

## 2020-12-02 DIAGNOSIS — H02831 Dermatochalasis of right upper eyelid: Secondary | ICD-10-CM | POA: Diagnosis not present

## 2020-12-10 DIAGNOSIS — Z0100 Encounter for examination of eyes and vision without abnormal findings: Secondary | ICD-10-CM | POA: Diagnosis not present

## 2021-02-09 DIAGNOSIS — L821 Other seborrheic keratosis: Secondary | ICD-10-CM | POA: Diagnosis not present

## 2021-02-09 DIAGNOSIS — D2272 Melanocytic nevi of left lower limb, including hip: Secondary | ICD-10-CM | POA: Diagnosis not present

## 2021-02-09 DIAGNOSIS — B078 Other viral warts: Secondary | ICD-10-CM | POA: Diagnosis not present

## 2021-02-09 DIAGNOSIS — L720 Epidermal cyst: Secondary | ICD-10-CM | POA: Diagnosis not present

## 2021-02-09 DIAGNOSIS — D485 Neoplasm of uncertain behavior of skin: Secondary | ICD-10-CM | POA: Diagnosis not present

## 2021-02-09 DIAGNOSIS — L578 Other skin changes due to chronic exposure to nonionizing radiation: Secondary | ICD-10-CM | POA: Diagnosis not present

## 2021-02-18 DIAGNOSIS — M2550 Pain in unspecified joint: Secondary | ICD-10-CM | POA: Diagnosis not present

## 2021-02-18 DIAGNOSIS — R309 Painful micturition, unspecified: Secondary | ICD-10-CM | POA: Diagnosis not present

## 2021-02-18 DIAGNOSIS — K580 Irritable bowel syndrome with diarrhea: Secondary | ICD-10-CM | POA: Diagnosis not present

## 2021-02-18 DIAGNOSIS — E039 Hypothyroidism, unspecified: Secondary | ICD-10-CM | POA: Diagnosis not present

## 2021-02-18 DIAGNOSIS — F419 Anxiety disorder, unspecified: Secondary | ICD-10-CM | POA: Diagnosis not present

## 2021-02-18 DIAGNOSIS — I10 Essential (primary) hypertension: Secondary | ICD-10-CM | POA: Diagnosis not present

## 2021-02-18 DIAGNOSIS — E782 Mixed hyperlipidemia: Secondary | ICD-10-CM | POA: Diagnosis not present

## 2021-02-18 DIAGNOSIS — F3342 Major depressive disorder, recurrent, in full remission: Secondary | ICD-10-CM | POA: Diagnosis not present

## 2021-02-18 DIAGNOSIS — K219 Gastro-esophageal reflux disease without esophagitis: Secondary | ICD-10-CM | POA: Diagnosis not present

## 2021-02-18 DIAGNOSIS — R69 Illness, unspecified: Secondary | ICD-10-CM | POA: Diagnosis not present

## 2021-04-23 DIAGNOSIS — H3530 Unspecified macular degeneration: Secondary | ICD-10-CM | POA: Diagnosis not present

## 2021-04-23 DIAGNOSIS — G4733 Obstructive sleep apnea (adult) (pediatric): Secondary | ICD-10-CM | POA: Diagnosis not present

## 2021-04-23 DIAGNOSIS — R69 Illness, unspecified: Secondary | ICD-10-CM | POA: Diagnosis not present

## 2021-04-23 DIAGNOSIS — F411 Generalized anxiety disorder: Secondary | ICD-10-CM | POA: Diagnosis not present

## 2021-04-23 DIAGNOSIS — Z008 Encounter for other general examination: Secondary | ICD-10-CM | POA: Diagnosis not present

## 2021-04-23 DIAGNOSIS — E669 Obesity, unspecified: Secondary | ICD-10-CM | POA: Diagnosis not present

## 2021-04-23 DIAGNOSIS — E039 Hypothyroidism, unspecified: Secondary | ICD-10-CM | POA: Diagnosis not present

## 2021-04-23 DIAGNOSIS — F329 Major depressive disorder, single episode, unspecified: Secondary | ICD-10-CM | POA: Diagnosis not present

## 2021-04-23 DIAGNOSIS — I10 Essential (primary) hypertension: Secondary | ICD-10-CM | POA: Diagnosis not present

## 2021-04-23 DIAGNOSIS — E785 Hyperlipidemia, unspecified: Secondary | ICD-10-CM | POA: Diagnosis not present

## 2021-04-23 DIAGNOSIS — J309 Allergic rhinitis, unspecified: Secondary | ICD-10-CM | POA: Diagnosis not present

## 2021-04-23 DIAGNOSIS — K08409 Partial loss of teeth, unspecified cause, unspecified class: Secondary | ICD-10-CM | POA: Diagnosis not present

## 2021-04-23 DIAGNOSIS — G8929 Other chronic pain: Secondary | ICD-10-CM | POA: Diagnosis not present

## 2021-04-23 DIAGNOSIS — I739 Peripheral vascular disease, unspecified: Secondary | ICD-10-CM | POA: Diagnosis not present

## 2021-04-24 DIAGNOSIS — M545 Low back pain, unspecified: Secondary | ICD-10-CM | POA: Diagnosis not present

## 2021-04-24 DIAGNOSIS — S32010D Wedge compression fracture of first lumbar vertebra, subsequent encounter for fracture with routine healing: Secondary | ICD-10-CM | POA: Diagnosis not present

## 2021-04-24 DIAGNOSIS — M4316 Spondylolisthesis, lumbar region: Secondary | ICD-10-CM | POA: Diagnosis not present

## 2021-05-02 HISTORY — PX: JOINT REPLACEMENT: SHX530

## 2021-05-05 DIAGNOSIS — M25551 Pain in right hip: Secondary | ICD-10-CM | POA: Diagnosis not present

## 2021-05-05 DIAGNOSIS — M47814 Spondylosis without myelopathy or radiculopathy, thoracic region: Secondary | ICD-10-CM | POA: Diagnosis not present

## 2021-05-05 DIAGNOSIS — W19XXXA Unspecified fall, initial encounter: Secondary | ICD-10-CM | POA: Diagnosis not present

## 2021-05-05 DIAGNOSIS — M1611 Unilateral primary osteoarthritis, right hip: Secondary | ICD-10-CM | POA: Diagnosis not present

## 2021-05-05 DIAGNOSIS — M5124 Other intervertebral disc displacement, thoracic region: Secondary | ICD-10-CM | POA: Diagnosis not present

## 2021-05-05 DIAGNOSIS — S32010A Wedge compression fracture of first lumbar vertebra, initial encounter for closed fracture: Secondary | ICD-10-CM | POA: Diagnosis not present

## 2021-05-05 DIAGNOSIS — M545 Low back pain, unspecified: Secondary | ICD-10-CM | POA: Diagnosis not present

## 2021-05-05 DIAGNOSIS — Y999 Unspecified external cause status: Secondary | ICD-10-CM | POA: Diagnosis not present

## 2021-05-07 DIAGNOSIS — M48061 Spinal stenosis, lumbar region without neurogenic claudication: Secondary | ICD-10-CM | POA: Diagnosis not present

## 2021-05-07 DIAGNOSIS — S32010D Wedge compression fracture of first lumbar vertebra, subsequent encounter for fracture with routine healing: Secondary | ICD-10-CM | POA: Diagnosis not present

## 2021-05-07 DIAGNOSIS — M5136 Other intervertebral disc degeneration, lumbar region: Secondary | ICD-10-CM | POA: Diagnosis not present

## 2021-05-07 DIAGNOSIS — M47816 Spondylosis without myelopathy or radiculopathy, lumbar region: Secondary | ICD-10-CM | POA: Diagnosis not present

## 2021-05-07 DIAGNOSIS — X58XXXD Exposure to other specified factors, subsequent encounter: Secondary | ICD-10-CM | POA: Diagnosis not present

## 2021-05-07 DIAGNOSIS — S32010A Wedge compression fracture of first lumbar vertebra, initial encounter for closed fracture: Secondary | ICD-10-CM | POA: Diagnosis not present

## 2021-05-15 DIAGNOSIS — S32010G Wedge compression fracture of first lumbar vertebra, subsequent encounter for fracture with delayed healing: Secondary | ICD-10-CM | POA: Diagnosis not present

## 2021-05-29 DIAGNOSIS — M545 Low back pain, unspecified: Secondary | ICD-10-CM | POA: Diagnosis not present

## 2021-05-29 DIAGNOSIS — Z6830 Body mass index (BMI) 30.0-30.9, adult: Secondary | ICD-10-CM | POA: Diagnosis not present

## 2021-05-29 DIAGNOSIS — I10 Essential (primary) hypertension: Secondary | ICD-10-CM | POA: Diagnosis not present

## 2021-05-29 DIAGNOSIS — S32010A Wedge compression fracture of first lumbar vertebra, initial encounter for closed fracture: Secondary | ICD-10-CM | POA: Diagnosis not present

## 2021-05-29 DIAGNOSIS — M4316 Spondylolisthesis, lumbar region: Secondary | ICD-10-CM | POA: Diagnosis not present

## 2021-07-14 DIAGNOSIS — I10 Essential (primary) hypertension: Secondary | ICD-10-CM | POA: Diagnosis not present

## 2021-07-14 DIAGNOSIS — Z6830 Body mass index (BMI) 30.0-30.9, adult: Secondary | ICD-10-CM | POA: Diagnosis not present

## 2021-07-14 DIAGNOSIS — S32010D Wedge compression fracture of first lumbar vertebra, subsequent encounter for fracture with routine healing: Secondary | ICD-10-CM | POA: Diagnosis not present

## 2021-07-17 ENCOUNTER — Other Ambulatory Visit: Payer: Self-pay | Admitting: Neurosurgery

## 2021-07-21 DIAGNOSIS — M130 Polyarthritis, unspecified: Secondary | ICD-10-CM | POA: Diagnosis not present

## 2021-07-21 DIAGNOSIS — R69 Illness, unspecified: Secondary | ICD-10-CM | POA: Diagnosis not present

## 2021-07-21 DIAGNOSIS — E039 Hypothyroidism, unspecified: Secondary | ICD-10-CM | POA: Diagnosis not present

## 2021-07-21 DIAGNOSIS — E782 Mixed hyperlipidemia: Secondary | ICD-10-CM | POA: Diagnosis not present

## 2021-07-21 DIAGNOSIS — R350 Frequency of micturition: Secondary | ICD-10-CM | POA: Diagnosis not present

## 2021-07-21 DIAGNOSIS — F3342 Major depressive disorder, recurrent, in full remission: Secondary | ICD-10-CM | POA: Diagnosis not present

## 2021-07-21 DIAGNOSIS — K580 Irritable bowel syndrome with diarrhea: Secondary | ICD-10-CM | POA: Diagnosis not present

## 2021-07-21 DIAGNOSIS — F419 Anxiety disorder, unspecified: Secondary | ICD-10-CM | POA: Diagnosis not present

## 2021-07-21 DIAGNOSIS — Z1389 Encounter for screening for other disorder: Secondary | ICD-10-CM | POA: Diagnosis not present

## 2021-07-21 DIAGNOSIS — N390 Urinary tract infection, site not specified: Secondary | ICD-10-CM | POA: Diagnosis not present

## 2021-07-21 DIAGNOSIS — I10 Essential (primary) hypertension: Secondary | ICD-10-CM | POA: Diagnosis not present

## 2021-07-21 DIAGNOSIS — Z7409 Other reduced mobility: Secondary | ICD-10-CM | POA: Diagnosis not present

## 2021-07-21 DIAGNOSIS — E559 Vitamin D deficiency, unspecified: Secondary | ICD-10-CM | POA: Diagnosis not present

## 2021-07-21 DIAGNOSIS — S32010G Wedge compression fracture of first lumbar vertebra, subsequent encounter for fracture with delayed healing: Secondary | ICD-10-CM | POA: Diagnosis not present

## 2021-07-22 LAB — CBC AND DIFFERENTIAL
HCT: 40 (ref 36–46)
Hemoglobin: 13.7 (ref 12.0–16.0)
Neutrophils Absolute: 5.2
Platelets: 318 10*3/uL (ref 150–400)
WBC: 7.3

## 2021-07-22 LAB — MICROALBUMIN / CREATININE URINE RATIO: Microalb Creat Ratio: 27

## 2021-07-22 LAB — BASIC METABOLIC PANEL
BUN: 16 (ref 4–21)
CO2: 22 (ref 13–22)
Chloride: 93 — AB (ref 99–108)
Creatinine: 1 (ref 0.5–1.1)
Glucose: 106
Potassium: 4.4 mEq/L (ref 3.5–5.1)
Sodium: 133 — AB (ref 137–147)

## 2021-07-22 LAB — HEPATIC FUNCTION PANEL
ALT: 17 U/L (ref 7–35)
AST: 16 (ref 13–35)
Alkaline Phosphatase: 100 (ref 25–125)
Bilirubin, Total: 0.5

## 2021-07-22 LAB — VITAMIN D 25 HYDROXY (VIT D DEFICIENCY, FRACTURES): Vit D, 25-Hydroxy: 80

## 2021-07-22 LAB — CBC: RBC: 4.26 (ref 3.87–5.11)

## 2021-07-22 LAB — PROTEIN / CREATININE RATIO, URINE
Albumin, U: 19
Creatinine, Urine: 69.2

## 2021-07-22 LAB — LIPID PANEL
Cholesterol: 193 (ref 0–200)
HDL: 64 (ref 35–70)
LDL Cholesterol: 99
LDl/HDL Ratio: 1.5
Triglycerides: 179 — AB (ref 40–160)

## 2021-07-22 LAB — COMPREHENSIVE METABOLIC PANEL
Albumin: 4.8 (ref 3.5–5.0)
Calcium: 9.6 (ref 8.7–10.7)
Globulin: 2
eGFR: 59

## 2021-07-22 LAB — TSH: TSH: 0.91 (ref 0.41–5.90)

## 2021-07-29 DIAGNOSIS — M1990 Unspecified osteoarthritis, unspecified site: Secondary | ICD-10-CM | POA: Diagnosis not present

## 2021-07-29 DIAGNOSIS — S32010D Wedge compression fracture of first lumbar vertebra, subsequent encounter for fracture with routine healing: Secondary | ICD-10-CM | POA: Diagnosis not present

## 2021-08-10 NOTE — Progress Notes (Signed)
Surgical Instructions    Your procedure is scheduled on Wednesday, January 18th, 2023.   Report to [facility] Main Entrance "A" at 06:30 A.M., then check in with the Admitting office.  Call this number if you have problems the morning of surgery:  [phone]   If you have any questions prior to your surgery date call [phone]: Open Monday-Friday 8am-4pm    Remember:  Do not eat or drink after midnight the night before your surgery    Take these medicines the morning of surgery with A SIP OF WATER:  amLODipine (NORVASC)  DULoxetine (CYMBALTA)  levothyroxine (SYNTHROID)  loratadine (CLARITIN)  omeprazole (PRILOSEC) rosuvastatin (CRESTOR)   If needed:   acetaminophen (TYLENOL)  Carboxymethylcellul-Glycerin (LUBRICATING EYE DROPS OP) clonazePAM (KLONOPIN) hyoscyamine (LEVSIN SL) oxyCODONE-acetaminophen (PERCOCET) traMADol (ULTRAM)   As of today, STOP taking any Aspirin (unless otherwise instructed by your surgeon) Aleve, Naproxen, Ibuprofen, Motrin, Advil, Goody's, BC's, all herbal medications, fish oil, and all vitamins.   After your COVID test   You are not required to quarantine however you are required to wear a well-fitting mask when you are out and around people not in your household.  If your mask becomes wet or soiled, replace with a new one.  Wash your hands often with soap and water for 20 seconds or clean your hands with an alcohol-based hand sanitizer that contains at least 60% alcohol.  Do not share personal items.  Notify your provider: if you are in close contact with someone who has COVID  or if you develop a fever of 100.4 or greater, sneezing, cough, sore throat, shortness of breath or body aches.    The day of surgery:          Do not wear jewelry or makeup Do not wear lotions, powders, perfumes, or deodorant. Do not shave 48 hours prior to surgery.   Do not bring valuables to the hospital. DO Not wear nail polish, gel polish, artificial  nails, or any other type of covering on natural nails including finger and toenails. If patients have artificial nails, gel coating, etc. that need to be removed by a nail salon, please have this removed prior to surgery or surgery may need to be canceled/delayed if the surgeon/ anesthesia feels like the patient is unable to be adequately monitored.              [facility] is not responsible for any belongings or valuables.  Do NOT Smoke (Tobacco/Vaping)  24 hours prior to your procedure  If you use a CPAP at night, you may bring your mask for your overnight stay.   Contacts, glasses, hearing aids, dentures or partials may not be worn into surgery, please bring cases for these belongings   For patients admitted to the hospital, discharge time will be determined by your treatment team.   Patients discharged the day of surgery will not be allowed to drive home, and someone needs to stay with them for 24 hours.  NO VISITORS WILL BE ALLOWED IN PRE-OP WHERE PATIENTS ARE PREPPED FOR SURGERY.  ONLY 1 SUPPORT PERSON MAY BE PRESENT IN THE WAITING ROOM WHILE YOU ARE IN SURGERY.  IF YOU ARE TO BE ADMITTED, ONCE YOU ARE IN YOUR ROOM YOU WILL BE ALLOWED TWO (2) VISITORS. 1 (ONE) VISITOR MAY STAY OVERNIGHT BUT MUST ARRIVE TO THE ROOM BY 8pm.  Minor children may have two parents present. Special consideration for safety and communication needs will be reviewed on a case by case  basis.  Special instructions:    Oral Hygiene is also important to reduce your risk of infection.  Remember - BRUSH YOUR TEETH THE MORNING OF SURGERY WITH YOUR REGULAR TOOTHPASTE   [facility]- Preparing For Surgery  Before surgery, you can play an important role. Because skin is not sterile, your skin needs to be as free of germs as possible. You can reduce the number of germs on your skin by washing with CHG (chlorahexidine gluconate) Soap before surgery.  CHG is an antiseptic cleaner which kills germs and bonds with the skin to  continue killing germs even after washing.     Please do not use if you have an allergy to CHG or antibacterial soaps. If your skin becomes reddened/irritated stop using the CHG.  Do not shave (including legs and underarms) for at least 48 hours prior to first CHG shower. It is OK to shave your face.  Please follow these instructions carefully.     Shower the NIGHT BEFORE SURGERY and the MORNING OF SURGERY with CHG Soap.   If you chose to wash your hair, wash your hair first as usual with your normal shampoo. After you shampoo, rinse your hair and body thoroughly to remove the shampoo.  Then [employer] and genitals (private parts) with your normal soap and rinse thoroughly to remove soap.  After that Use CHG Soap as you would any other liquid soap. You can apply CHG directly to the skin and wash gently with a scrungie or a clean washcloth.   Apply the CHG Soap to your body ONLY FROM THE NECK DOWN.  Do not use on open wounds or open sores. Avoid contact with your eyes, ears, mouth and genitals (private parts). Wash Face and genitals (private parts)  with your normal soap.   Wash thoroughly, paying special attention to the area where your surgery will be performed.  Thoroughly rinse your body with warm water from the neck down.  DO NOT shower/wash with your normal soap after using and rinsing off the CHG Soap.  Pat yourself dry with a CLEAN TOWEL.  Wear CLEAN PAJAMAS to bed the night before surgery  Place CLEAN SHEETS on your bed the night before your surgery  DO NOT SLEEP WITH PETS.   Day of Surgery:  Take a shower with CHG soap. Wear Clean/Comfortable clothing the morning of surgery Do not apply any deodorants/lotions.   Remember to brush your teeth WITH YOUR REGULAR TOOTHPASTE.   Please read over the following fact sheets that you were given.

## 2021-08-11 ENCOUNTER — Encounter (HOSPITAL_COMMUNITY)
Admission: RE | Admit: 2021-08-11 | Discharge: 2021-08-11 | Disposition: A | Discharge status: Home / Self Care | Payer: Medicare HMO | Source: Ambulatory Visit | Attending: Neurosurgery | Admitting: Neurosurgery

## 2021-08-11 ENCOUNTER — Other Ambulatory Visit: Payer: Self-pay | Admitting: Neurosurgery

## 2021-08-11 ENCOUNTER — Encounter (HOSPITAL_COMMUNITY): Payer: Self-pay

## 2021-08-11 ENCOUNTER — Other Ambulatory Visit: Payer: Self-pay

## 2021-08-11 VITALS — BP 157/87 | HR 77 | Temp 98.3°F | Resp 17 | Ht 64.0 in | Wt 174.6 lb

## 2021-08-11 DIAGNOSIS — R300 Dysuria: Secondary | ICD-10-CM | POA: Insufficient documentation

## 2021-08-11 DIAGNOSIS — Z01818 Encounter for other preprocedural examination: Secondary | ICD-10-CM | POA: Insufficient documentation

## 2021-08-11 HISTORY — DX: Chronic kidney disease, unspecified: N18.9

## 2021-08-11 HISTORY — DX: Anxiety disorder, unspecified: F41.9

## 2021-08-11 HISTORY — DX: Angina pectoris, unspecified: I20.9

## 2021-08-11 HISTORY — DX: Sleep apnea, unspecified: G47.30

## 2021-08-11 LAB — URINALYSIS, ROUTINE W REFLEX MICROSCOPIC
Bilirubin Urine: NEGATIVE
Glucose, UA: NEGATIVE mg/dL
Ketones, ur: NEGATIVE mg/dL
Leukocytes,Ua: NEGATIVE
Nitrite: NEGATIVE
Protein, ur: NEGATIVE mg/dL
Specific Gravity, Urine: 1.02 (ref 1.005–1.030)
pH: 6 (ref 5.0–8.0)

## 2021-08-11 LAB — SURGICAL PCR SCREEN
MRSA, PCR: NEGATIVE
Staphylococcus aureus: NEGATIVE

## 2021-08-11 LAB — URINALYSIS, MICROSCOPIC (REFLEX)

## 2021-08-11 NOTE — Progress Notes (Signed)
Abnormal urinalysis in PAT. Dr. Arnoldo Morale office was called with results. Patient's daughter, Sophia Nash, was informed and asked to call patient's PCP for further evaluation.

## 2021-08-11 NOTE — Progress Notes (Addendum)
PCP - [name], [place] Cardiologist - Oswaldo Milian, MD  PPM/ICD - denies Device Orders - n/a Rep Notified - n/a  Chest x-ray - n/a EKG - 08/11/2021 Stress Test - 01/28/2020 ECHO - 12/11/2019 Cardiac Cath - denies  Sleep Study - yes, positive for OSA CPAP - not currently  Fasting Blood Sugar - n/a  Blood Thinner Instructions: n/a  Aspirin Instructions: Patient was instructed: As of today, STOP taking any Aspirin (unless otherwise instructed by your surgeon) Aleve, Naproxen, Ibuprofen, Motrin, Advil, Goody's, BC's, all herbal medications, fish oil, and all vitamins.  ERAS Protcol - n/a  COVID TEST- no - ambulatory surgery   Anesthesia review: yes, cardiac history; urine culture sent to the lab  Patient complained in PAT of having discomfort with urination (burning and itching). Patient verbalized that she had in the past multiple UTIs and she asked if she can have an Urinalysis done today. UA was done in PAT.  Patient denies shortness of breath, fever, cough and chest pain at PAT appointment   All instructions explained to the patient, with a verbal understanding of the material. Patient agrees to go over the instructions while at home for a better understanding. Patient also instructed to self quarantine after being tested for COVID-19. The opportunity to ask questions was provided.

## 2021-08-12 LAB — URINE CULTURE: Culture: NO GROWTH

## 2021-08-12 NOTE — Progress Notes (Signed)
Urine culture was requested per PCP

## 2021-08-12 NOTE — Progress Notes (Signed)
Anesthesia Chart Review:  Patient seen by cardiology June 2021 for evaluation of atypical chest pain.  Nuclear stress was ordered and showed EF 60%, no evidence of ischemia or infarction, low risk study.  Patient also had a coronary calcium scan for risk stratification which showed a calcium score to 35.  She was recommended to start a statin for primary prevention.  Patient last seen by PCP [name], PA-C on 07/21/2021 for acute UTI.  Upcoming surgery was also discussed.  Per note, "she is ready for her surgery."  CMP and CBC from that date are in [place], reviewed, unremarkable.  EKG 08/12/21: Normal sinus rhythm. Rate 75. Nonspecific T wave abnormality. No significant change since last tracing  CT cardiac scoring 01/28/2020: FINDINGS: Ascending Aorta: Normal caliber. Scattered calcifications in the ascending and descending aorta.   Aortic Valve: Scattered calcifications.   Mitral Valve: Mitral annular calcifications of the posterior mitral valve annulus.   Pericardium: Normal   Coronary arteries: Normal coronary origins.   IMPRESSION: Coronary calcium score of 235. This was percentile for age and sex matched control.  Nuclear stress 01/20/2020: Nuclear stress EF: 68%. The left ventricular ejection fraction is normal (55-65%). There was no ST segment deviation noted during stress. This is a low risk study. There is no evidence of ischemia and no evidence of infarction. The study is normal.    Sophia Nash [facility]/Anesthesiology Phone [phone] 08/18/2021 2:57 PM

## 2021-08-13 NOTE — Progress Notes (Signed)
Urine culture result was communicated to Ethlyn Gallery, [place] (PCP)

## 2021-08-18 NOTE — Anesthesia Preprocedure Evaluation (Addendum)
Anesthesia Evaluation  Patient identified by MRN, date of birth, ID band Patient awake    Reviewed: Allergy & Precautions, NPO status , Patient's Chart, lab work & pertinent test results  History of Anesthesia Complications Negative for: history of anesthetic complications  Airway Mallampati: II  TM Distance: >3 FB Neck ROM: Full    Dental  (+) Partial Lower, Partial Upper   Pulmonary sleep apnea (no longer using CPAP) ,    breath sounds clear to auscultation       Cardiovascular hypertension, Pt. on medications  Rhythm:Regular Rate:Normal  '21 Stress: EF 68%, normal perfusion   Neuro/Psych Anxiety Depression negative neurological ROS     GI/Hepatic Neg liver ROS, GERD  Medicated and Controlled,  Endo/Other  Hypothyroidism   Renal/GU Renal InsufficiencyRenal disease     Musculoskeletal  (+) Arthritis , Osteoarthritis,    Abdominal   Peds  Hematology negative hematology ROS (+)   Anesthesia Other Findings   Reproductive/Obstetrics                           Anesthesia Physical Anesthesia Plan  ASA: 3  Anesthesia Plan: General   Post-op Pain Management: Tylenol PO (pre-op) and Toradol IV (intra-op)   Induction: Intravenous  PONV Risk Score and Plan: 3 and Ondansetron, Dexamethasone and Treatment may vary due to age or medical condition  Airway Management Planned: Oral ETT  Additional Equipment: None  Intra-op Plan:   Post-operative Plan: Extubation in OR  Informed Consent: I have reviewed the patients History and Physical, chart, labs and discussed the procedure including the risks, benefits and alternatives for the proposed anesthesia with the patient or authorized representative who has indicated his/her understanding and acceptance.     Dental advisory given  Plan Discussed with: CRNA and Surgeon  Anesthesia Plan Comments: (PAT note by Karoline Caldwell, PA-C:  Patient seen  by cardiology June 2021 for evaluation of atypical chest pain.  Nuclear stress was ordered and showed EF 60%, no evidence of ischemia or infarction, low risk study.  Patient also had a coronary calcium scan for risk stratification which showed a calcium score to 35.  She was recommended to start a statin for primary prevention.  Patient last seen by PCP [name], PA-C on 07/21/2021 for acute UTI.  Upcoming surgery was also discussed.  Per note, "she is ready for her surgery."  CMP and CBC from that date are in [place], reviewed, unremarkable.  EKG 08/12/21: Normal sinus rhythm. Rate 75. Nonspecific T wave abnormality. No significant change since last tracing  CT cardiac scoring 01/28/2020: FINDINGS: Ascending Aorta: Normal caliber. Scattered calcifications in the ascending and descending aorta.  Aortic Valve: Scattered calcifications.  Mitral Valve: Mitral annular calcifications of the posterior mitral valve annulus.  Pericardium: Normal  Coronary arteries: Normal coronary origins.  IMPRESSION: Coronary calcium score of 235. This was percentile for age and sex matched control.  Nuclear stress 01/20/2020:  Nuclear stress EF: 68%. The left ventricular ejection fraction is normal (55-65%).  There was no ST segment deviation noted during stress.  This is a low risk study. There is no evidence of ischemia and no evidence of infarction.  The study is normal.  )      Anesthesia Quick Evaluation

## 2021-08-19 ENCOUNTER — Ambulatory Visit (HOSPITAL_COMMUNITY): Payer: Medicare HMO | Admitting: Anesthesiology

## 2021-08-19 ENCOUNTER — Ambulatory Visit (HOSPITAL_COMMUNITY)
Admission: RE | Admit: 2021-08-19 | Discharge: 2021-08-19 | Disposition: A | Discharge status: Home / Self Care | Payer: Medicare HMO | Source: Ambulatory Visit | Attending: Neurosurgery | Admitting: Neurosurgery

## 2021-08-19 ENCOUNTER — Ambulatory Visit (HOSPITAL_COMMUNITY): Payer: Medicare HMO

## 2021-08-19 ENCOUNTER — Other Ambulatory Visit: Payer: Self-pay

## 2021-08-19 ENCOUNTER — Encounter (HOSPITAL_COMMUNITY)
Admission: RE | Disposition: A | Discharge status: Home / Self Care | Payer: Self-pay | Source: Ambulatory Visit | Attending: Neurosurgery

## 2021-08-19 ENCOUNTER — Encounter (HOSPITAL_COMMUNITY): Payer: Self-pay | Admitting: Neurosurgery

## 2021-08-19 ENCOUNTER — Ambulatory Visit (HOSPITAL_COMMUNITY): Payer: Medicare HMO | Admitting: Physician Assistant

## 2021-08-19 DIAGNOSIS — I10 Essential (primary) hypertension: Secondary | ICD-10-CM | POA: Diagnosis not present

## 2021-08-19 DIAGNOSIS — K219 Gastro-esophageal reflux disease without esophagitis: Secondary | ICD-10-CM | POA: Diagnosis not present

## 2021-08-19 DIAGNOSIS — M8088XA Other osteoporosis with current pathological fracture, vertebra(e), initial encounter for fracture: Secondary | ICD-10-CM | POA: Insufficient documentation

## 2021-08-19 DIAGNOSIS — M1990 Unspecified osteoarthritis, unspecified site: Secondary | ICD-10-CM | POA: Diagnosis not present

## 2021-08-19 DIAGNOSIS — G4730 Sleep apnea, unspecified: Secondary | ICD-10-CM | POA: Insufficient documentation

## 2021-08-19 DIAGNOSIS — E039 Hypothyroidism, unspecified: Secondary | ICD-10-CM | POA: Diagnosis not present

## 2021-08-19 DIAGNOSIS — F418 Other specified anxiety disorders: Secondary | ICD-10-CM | POA: Insufficient documentation

## 2021-08-19 DIAGNOSIS — S32009D Unspecified fracture of unspecified lumbar vertebra, subsequent encounter for fracture with routine healing: Secondary | ICD-10-CM | POA: Diagnosis not present

## 2021-08-19 DIAGNOSIS — S22089A Unspecified fracture of T11-T12 vertebra, initial encounter for closed fracture: Secondary | ICD-10-CM | POA: Insufficient documentation

## 2021-08-19 DIAGNOSIS — W19XXXA Unspecified fall, initial encounter: Secondary | ICD-10-CM | POA: Diagnosis not present

## 2021-08-19 DIAGNOSIS — I129 Hypertensive chronic kidney disease with stage 1 through stage 4 chronic kidney disease, or unspecified chronic kidney disease: Secondary | ICD-10-CM | POA: Insufficient documentation

## 2021-08-19 DIAGNOSIS — R69 Illness, unspecified: Secondary | ICD-10-CM | POA: Diagnosis not present

## 2021-08-19 DIAGNOSIS — Z419 Encounter for procedure for purposes other than remedying health state, unspecified: Secondary | ICD-10-CM

## 2021-08-19 DIAGNOSIS — M8008XA Age-related osteoporosis with current pathological fracture, vertebra(e), initial encounter for fracture: Secondary | ICD-10-CM | POA: Diagnosis not present

## 2021-08-19 DIAGNOSIS — M8008XG Age-related osteoporosis with current pathological fracture, vertebra(e), subsequent encounter for fracture with delayed healing: Secondary | ICD-10-CM | POA: Diagnosis not present

## 2021-08-19 DIAGNOSIS — S32019A Unspecified fracture of first lumbar vertebra, initial encounter for closed fracture: Secondary | ICD-10-CM | POA: Insufficient documentation

## 2021-08-19 DIAGNOSIS — N189 Chronic kidney disease, unspecified: Secondary | ICD-10-CM | POA: Insufficient documentation

## 2021-08-19 DIAGNOSIS — S22080D Wedge compression fracture of T11-T12 vertebra, subsequent encounter for fracture with routine healing: Secondary | ICD-10-CM | POA: Diagnosis not present

## 2021-08-19 HISTORY — PX: KYPHOPLASTY: SHX5884

## 2021-08-19 SURGERY — KYPHOPLASTY
Anesthesia: General

## 2021-08-19 MED ORDER — FENTANYL CITRATE (PF) 250 MCG/5ML IJ SOLN
INTRAMUSCULAR | Status: AC
  Filled 2021-08-19: qty 5

## 2021-08-19 MED ORDER — ONDANSETRON HCL 4 MG/2ML IJ SOLN
INTRAMUSCULAR | Status: DC | PRN
  Administered 2021-08-19: 4 mg via INTRAVENOUS

## 2021-08-19 MED ORDER — FENTANYL CITRATE (PF) 250 MCG/5ML IJ SOLN
INTRAMUSCULAR | Status: DC | PRN
  Administered 2021-08-19 (×4): 50 ug via INTRAVENOUS

## 2021-08-19 MED ORDER — BUPIVACAINE-EPINEPHRINE 0.5% -1:200000 IJ SOLN
INTRAMUSCULAR | Status: AC
  Filled 2021-08-19: qty 1

## 2021-08-19 MED ORDER — PROPOFOL 10 MG/ML IV BOLUS
INTRAVENOUS | Status: AC
  Filled 2021-08-19: qty 20

## 2021-08-19 MED ORDER — DEXAMETHASONE SODIUM PHOSPHATE 10 MG/ML IJ SOLN
INTRAMUSCULAR | Status: DC | PRN
Start: 2021-08-19 — End: 2021-08-19
  Administered 2021-08-19: 10 mg via INTRAVENOUS

## 2021-08-19 MED ORDER — OXYCODONE-ACETAMINOPHEN 5-325 MG PO TABS: 1.0000 | ORAL_TABLET | ORAL | 0 refills | Status: DC | PRN

## 2021-08-19 MED ORDER — PHENYLEPHRINE 40 MCG/ML (10ML) SYRINGE FOR IV PUSH (FOR BLOOD PRESSURE SUPPORT)
PREFILLED_SYRINGE | INTRAVENOUS | Status: DC | PRN
  Administered 2021-08-19: 80 ug via INTRAVENOUS
  Administered 2021-08-19 (×2): 40 ug via INTRAVENOUS
  Administered 2021-08-19: 120 ug via INTRAVENOUS

## 2021-08-19 MED ORDER — ACETAMINOPHEN 500 MG PO TABS
1000.0000 mg | ORAL_TABLET | Freq: Once | ORAL | Status: AC
  Administered 2021-08-19: 1000 mg via ORAL
  Filled 2021-08-19: qty 2

## 2021-08-19 MED ORDER — LACTATED RINGERS IV SOLN: INTRAVENOUS | Status: DC

## 2021-08-19 MED ORDER — PROPOFOL 10 MG/ML IV BOLUS
INTRAVENOUS | Status: DC | PRN
  Administered 2021-08-19: 120 mg via INTRAVENOUS

## 2021-08-19 MED ORDER — OXYCODONE-ACETAMINOPHEN 5-325 MG PO TABS: 1.0000 | ORAL_TABLET | ORAL | Status: DC | PRN

## 2021-08-19 MED ORDER — BACITRACIN ZINC 500 UNIT/GM EX OINT
TOPICAL_OINTMENT | CUTANEOUS | Status: AC
  Filled 2021-08-19: qty 28.35

## 2021-08-19 MED ORDER — IOPAMIDOL (ISOVUE-300) INJECTION 61%
INTRAVENOUS | Status: DC | PRN
  Administered 2021-08-19: 100 mL

## 2021-08-19 MED ORDER — ROCURONIUM BROMIDE 10 MG/ML (PF) SYRINGE
PREFILLED_SYRINGE | INTRAVENOUS | Status: DC | PRN
  Administered 2021-08-19: 60 mg via INTRAVENOUS
  Administered 2021-08-19: 20 mg via INTRAVENOUS

## 2021-08-19 MED ORDER — CHLORHEXIDINE GLUCONATE CLOTH 2 % EX PADS: 6.0000 | MEDICATED_PAD | Freq: Once | CUTANEOUS | Status: DC

## 2021-08-19 MED ORDER — ORAL CARE MOUTH RINSE: 15.0000 mL | Freq: Once | OROMUCOSAL | Status: AC

## 2021-08-19 MED ORDER — KETOROLAC TROMETHAMINE 30 MG/ML IJ SOLN
INTRAMUSCULAR | Status: DC | PRN
  Administered 2021-08-19: 30 mg via INTRAVENOUS

## 2021-08-19 MED ORDER — BACITRACIN ZINC 500 UNIT/GM EX OINT
TOPICAL_OINTMENT | CUTANEOUS | Status: DC | PRN
  Administered 2021-08-19: 1 via TOPICAL

## 2021-08-19 MED ORDER — BUPIVACAINE-EPINEPHRINE 0.5% -1:200000 IJ SOLN
INTRAMUSCULAR | Status: DC | PRN
  Administered 2021-08-19: 10 mL

## 2021-08-19 MED ORDER — CLINDAMYCIN PHOSPHATE 900 MG/50ML IV SOLN
INTRAVENOUS | Status: DC | PRN
  Administered 2021-08-19: 900 mg via INTRAVENOUS

## 2021-08-19 MED ORDER — CHLORHEXIDINE GLUCONATE 0.12 % MT SOLN
15.0000 mL | Freq: Once | OROMUCOSAL | Status: AC
  Administered 2021-08-19: 15 mL via OROMUCOSAL
  Filled 2021-08-19: qty 15

## 2021-08-19 MED ORDER — LIDOCAINE 2% (20 MG/ML) 5 ML SYRINGE
INTRAMUSCULAR | Status: DC | PRN
Start: 2021-08-19 — End: 2021-08-19
  Administered 2021-08-19: 60 mg via INTRAVENOUS

## 2021-08-19 MED ORDER — SUGAMMADEX SODIUM 200 MG/2ML IV SOLN
INTRAVENOUS | Status: DC | PRN
  Administered 2021-08-19: 200 mg via INTRAVENOUS

## 2021-08-19 MED ORDER — VANCOMYCIN HCL IN DEXTROSE 1-5 GM/200ML-% IV SOLN
1000.0000 mg | INTRAVENOUS | Status: AC
  Administered 2021-08-19: 1000 mg via INTRAVENOUS
  Filled 2021-08-19: qty 200

## 2021-08-19 MED ORDER — 0.9 % SODIUM CHLORIDE (POUR BTL) OPTIME
TOPICAL | Status: DC | PRN
  Administered 2021-08-19: 2000 mL

## 2021-08-19 MED ORDER — FENTANYL CITRATE (PF) 100 MCG/2ML IJ SOLN: 25.0000 ug | INTRAMUSCULAR | Status: DC | PRN

## 2021-08-19 SURGICAL SUPPLY — 39 items
BAG COUNTER SPONGE SURGICOUNT (BAG) ×3 IMPLANT
BENZOIN TINCTURE PRP APPL 2/3 (GAUZE/BANDAGES/DRESSINGS) ×2 IMPLANT
BLADE CLIPPER SURG (BLADE) IMPLANT
BLADE SURG 15 STRL LF DISP TIS (BLADE) ×1 IMPLANT
BLADE SURG 15 STRL SS (BLADE) ×1
CARTRIDGE OIL MAESTRO DRILL (MISCELLANEOUS) ×1 IMPLANT
CEMENT KYPHON C01A KIT/MIXER (Cement) ×1 IMPLANT
DIFFUSER DRILL AIR PNEUMATIC (MISCELLANEOUS) ×1 IMPLANT
DRAPE C-ARM 42X72 X-RAY (DRAPES) ×2 IMPLANT
DRAPE HALF SHEET 40X57 (DRAPES) ×2 IMPLANT
DRAPE INCISE IOBAN 66X45 STRL (DRAPES) ×2 IMPLANT
DRAPE LAPAROTOMY 100X72X124 (DRAPES) ×2 IMPLANT
DRAPE SURG 17X23 STRL (DRAPES) ×8 IMPLANT
DRAPE WARM FLUID 44X44 (DRAPES) ×2 IMPLANT
DRSG OPSITE POSTOP 4X6 (GAUZE/BANDAGES/DRESSINGS) ×1 IMPLANT
GAUZE 4X4 16PLY ~~LOC~~+RFID DBL (SPONGE) ×3 IMPLANT
GAUZE SPONGE 4X4 12PLY STRL (GAUZE/BANDAGES/DRESSINGS) ×1 IMPLANT
GLOVE EXAM NITRILE XL STR (GLOVE) IMPLANT
GLOVE SURG ENC MOIS LTX SZ8 (GLOVE) ×2 IMPLANT
GLOVE SURG ENC MOIS LTX SZ8.5 (GLOVE) ×2 IMPLANT
GOWN STRL REUS W/ TWL LRG LVL3 (GOWN DISPOSABLE) IMPLANT
GOWN STRL REUS W/ TWL XL LVL3 (GOWN DISPOSABLE) IMPLANT
GOWN STRL REUS W/TWL LRG LVL3 (GOWN DISPOSABLE)
GOWN STRL REUS W/TWL XL LVL3 (GOWN DISPOSABLE)
INTRODUCER DEVICE OSTEO LEVEL (INTRODUCER) ×1 IMPLANT
KIT BASIN OR (CUSTOM PROCEDURE TRAY) ×2 IMPLANT
KIT TURNOVER KIT B (KITS) ×2 IMPLANT
NEEDLE HYPO 22GX1.5 SAFETY (NEEDLE) ×2 IMPLANT
NS IRRIG 1000ML POUR BTL (IV SOLUTION) ×2 IMPLANT
OIL CARTRIDGE MAESTRO DRILL (MISCELLANEOUS)
PACK EENT II TURBAN DRAPE (CUSTOM PROCEDURE TRAY) ×2 IMPLANT
PAD ARMBOARD 7.5X6 YLW CONV (MISCELLANEOUS) ×6 IMPLANT
SPECIMEN JAR SMALL (MISCELLANEOUS) IMPLANT
STRIP CLOSURE SKIN 1/2X4 (GAUZE/BANDAGES/DRESSINGS) ×2 IMPLANT
SUT VIC AB 3-0 SH 8-18 (SUTURE) ×1 IMPLANT
SYR CONTROL 10ML LL (SYRINGE) ×2 IMPLANT
TOWEL GREEN STERILE (TOWEL DISPOSABLE) ×2 IMPLANT
TOWEL GREEN STERILE FF (TOWEL DISPOSABLE) ×2 IMPLANT
TRAY KYPHOPAK 15/3 ONESTEP 1ST (MISCELLANEOUS) ×2 IMPLANT

## 2021-08-19 NOTE — Anesthesia Postprocedure Evaluation (Signed)
Anesthesia Post Note  Patient: Sophia Nash  Procedure(s) Performed: KYPHOPLASTY THORACIC TWELVE, LUMBAR ONE     Patient location during evaluation: PACU Anesthesia Type: General Level of consciousness: awake and alert, patient cooperative and oriented Pain management: pain level controlled Vital Signs Assessment: post-procedure vital signs reviewed and stable Respiratory status: spontaneous breathing, nonlabored ventilation and respiratory function stable Cardiovascular status: blood pressure returned to baseline and stable Postop Assessment: no apparent nausea or vomiting and adequate PO intake Anesthetic complications: no   No notable events documented.  Last Vitals:  Vitals:   08/19/21 1050 08/19/21 1105  BP: 137/72 (!) 149/73  Pulse: 72 72  Resp: 17 18  Temp:  36.6 C  SpO2: 96% 95%    Last Pain:  Vitals:   08/19/21 1105  TempSrc:   PainSc: 0-No pain                 Puneet Selden,E. Juda Toepfer

## 2021-08-19 NOTE — Anesthesia Procedure Notes (Signed)
Procedure Name: Intubation Date/Time: 08/19/2021 8:42 AM Performed by: Janace Litten, CRNA Pre-anesthesia Checklist: Patient identified, Emergency Drugs available, Suction available and Patient being monitored Patient Re-evaluated:Patient Re-evaluated prior to induction Oxygen Delivery Method: Circle System Utilized Preoxygenation: Pre-oxygenation with 100% oxygen Induction Type: IV induction Ventilation: Mask ventilation without difficulty Laryngoscope Size: Mac and 3 Grade View: Grade I Tube type: Oral Tube size: 6.5 mm Number of attempts: 1 Airway Equipment and Method: Stylet and Oral airway Placement Confirmation: ETT inserted through vocal cords under direct vision, positive ETCO2 and breath sounds checked- equal and bilateral Secured at: 19 cm Tube secured with: Tape Dental Injury: Teeth and Oropharynx as per pre-operative assessment

## 2021-08-19 NOTE — Transfer of Care (Signed)
Immediate Anesthesia Transfer of Care Note  Patient: Sophia Nash  Procedure(s) Performed: KYPHOPLASTY THORACIC TWELVE, LUMBAR ONE  Patient Location: PACU  Anesthesia Type:General  Level of Consciousness: awake, alert  and patient cooperative  Airway & Oxygen Therapy: Patient Spontanous Breathing  Post-op Assessment: Report given to RN and Post -op Vital signs reviewed and stable  Post vital signs: Reviewed and stable  Last Vitals:  Vitals Value Taken Time  BP 147/77 08/19/21 1032  Temp    Pulse 74 08/19/21 1033  Resp 18 08/19/21 1033  SpO2 95 % 08/19/21 1033  Vitals shown include unvalidated device data.  Last Pain:  Vitals:   08/19/21 0712  TempSrc:   PainSc: 5       Patients Stated Pain Goal: 0 (53/20/23 3435)  Complications: No notable events documented.

## 2021-08-19 NOTE — H&P (Signed)
Subjective: The patient is a 87 year old white female who fell.  She developed pain at the thoracolumbar junction.  She failed medical management.  She was worked up with lumbar x-rays and lumbar MRI which demonstrated T12 and L1 compression fractures.  I discussed the various treatment options with her.  She has decided proceed with surgery.  Past Medical History:  Diagnosis Date   Allergy    Anemia    Anginal pain ([place])    Anxiety    Arthritis    Atypical moles    Breast lump    left   Chronic kidney disease    Colon polyp    Depression    Diverticulitis    04-15-15 no recent issues   Dysrhythmia 01/2020   Edema    legs, hands  bilateral   GERD (gastroesophageal reflux disease)    hx of    Heart murmur 01/2020   Hypertension    Hypothyroidism    Impaired hearing    bilaterally some hearing loss-no hearing aids   Mild cognitive impairment with memory loss 08/02/2000   per Dr Berdine Addison, neuro, 04-15-15 no changes-"stable"   Recurrent upper respiratory infection (URI)    cold with sinus drainage x several months- saw PCP 10/08/11- chest x ray report on chart.  no fever,resolved, no recent issues as of 04-15-15.   Shoulder joint pain    04-07-15 "tripped-loss balance, fell awkwardly and "wrenched shoulder-left", Known "bad right shoulder"bone on bone"   Skin lesion of breast    hx of right lateral breast  lesion-tx. with multipe rounds oral antibiotic- resolved, no reoccurrence.   Sleep apnea    Thyroid disease    hypothyroidism    Past Surgical History:  Procedure Laterality Date   BREAST SURGERY     lumpectomy- left-benign   CATARACT EXTRACTION, BILATERAL Bilateral    CHOLECYSTECTOMY     laparoscopic   COLECTOMY  10/20/2011   sigmoid colectomy   COLONOSCOPY     ESOPHAGOGASTRODUODENOSCOPY     EYE SURGERY     bilateral cataracts   HERNIA REPAIR     INCISIONAL HERNIA REPAIR N/A 01/12/2013   Procedure: LAPAROSCOPIC/OPEN REPAIR INCISIONAL HERNIA;  Surgeon: [name],  MD;  Location: [facility];  Service: General;  Laterality: N/A;  With Mesh   INCISIONAL HERNIA REPAIR N/A 04/23/2015   Procedure: LAPAROSCOPIC ASSISTED INCISIONAL VENTRAL HERNIA REPAIR;  Surgeon: [name], MD;  Location: [facility];  Service: General;  Laterality: N/A;   INSERTION OF MESH N/A 04/23/2015   Procedure: INSERTION OF MESH;  Surgeon: [name], MD;  Location: [facility];  Service: General;  Laterality: N/A;   JOINT REPLACEMENT  05/2021   left hip   TUBAL LIGATION      Allergies  Allergen Reactions   Penicillins Anaphylaxis   Codeine     Makes pt overly excited.... Anxious/ hyperactive    Nsaids     Burns stomach     Social History   Tobacco Use   Smoking status: Never   Smokeless tobacco: Never  Substance Use Topics   Alcohol use: No    Family History  Problem Relation Age of Onset   Cancer Mother        breast   Heart disease Father    Hypertension Sister    Cancer Brother        colon   Prior to Admission medications   Medication Sig Start Date End Date Taking? Authorizing Provider  acetaminophen (TYLENOL) 500 MG tablet Take  1,000 mg by mouth every 6 (six) hours as needed for moderate pain.   Yes [provider]  amLODipine (NORVASC) 5 MG tablet Take 5 mg by mouth daily.   Yes [provider]  Calcium Carb-Cholecalciferol (CALCIUM 600 + D PO) Take 1 tablet by mouth daily.   Yes [provider]  Carboxymethylcellul-Glycerin (LUBRICATING EYE DROPS OP) Place 1 drop into both eyes daily as needed (dry eyes).   Yes [provider]  celecoxib (CELEBREX) 100 MG capsule Take 100 mg by mouth 2 (two) times daily as needed for moderate pain.   Yes [provider]  clonazePAM (KLONOPIN) 0.5 MG tablet Take 0.5 mg by mouth 2 (two) times daily as needed for anxiety.   Yes [provider]  DULoxetine (CYMBALTA) 60 MG capsule Take 60 mg by mouth daily.   Yes [provider]  hyoscyamine (LEVSIN SL) 0.125 MG SL  tablet Place 0.125 mg under the tongue 4 (four) times daily as needed for cramping. 06/26/21  Yes [provider]  levothyroxine (SYNTHROID) 50 MCG tablet Take 50 mcg by mouth daily before breakfast.   Yes [provider]  loratadine (CLARITIN) 10 MG tablet Take 10 mg by mouth daily.   Yes [provider]  Multiple Vitamins-Minerals (PRESERVISION AREDS 2+MULTI VIT PO) Take 1 capsule by mouth 2 (two) times daily.   Yes [provider]  omeprazole (PRILOSEC) 20 MG capsule Take 20 mg by mouth daily.   Yes [provider]  oxyCODONE-acetaminophen (PERCOCET) 10-325 MG tablet Take 0.5-1 tablets by mouth daily as needed for pain. 05/15/21  Yes [provider]  rosuvastatin (CRESTOR) 5 MG tablet Take 1 tablet (5 mg total) by mouth daily. 04/10/20 08/10/21 Yes Donato Heinz, MD  traMADol (ULTRAM) 50 MG tablet Take 50-100 mg by mouth every 4 (four) hours as needed for moderate pain.   Yes [provider]  triamterene-hydrochlorothiazide (DYAZIDE) 37.5-25 MG per capsule Take 1 capsule by mouth every morning.    Yes [provider]  vitamin B-12 (CYANOCOBALAMIN) 100 MCG tablet Take 100 mcg by mouth daily.   Yes [provider]     Review of Systems  Positive ROS: As above  All other systems have been reviewed and were otherwise negative with the exception of those mentioned in the HPI and as above.  Objective: Vital signs in last 24 hours: Temp:  [97.9 F (36.6 C)] 97.9 F (36.6 C) (01/18 0700) Pulse Rate:  [70] 70 (01/18 0700) Resp:  [18] 18 (01/18 0700) BP: (164)/(72) 164/72 (01/18 0700) SpO2:  [95 %] 95 % (01/18 0700) Weight:  [78.9 kg] 78.9 kg (01/18 0700) Estimated body mass index is 29.87 kg/m as calculated from the following:   Height as of this encounter: 5\' 4"  (1.626 m).   Weight as of this encounter: 78.9 kg.   General Appearance: Alert Head: Normocephalic, without obvious abnormality,  atraumatic Eyes: PERRL, conjunctiva/corneas clear, EOM's intact,    Ears: Normal  Throat: Normal  Neck: Supple, Back: unremarkable Lungs: Clear to auscultation bilaterally, respirations unlabored Heart: Regular rate and rhythm, no murmur, rub or gallop Abdomen: Soft, non-tender Extremities: Extremities normal, atraumatic, no cyanosis or edema Skin: unremarkable  NEUROLOGIC:   Mental status: alert and oriented,Motor Exam - grossly normal Sensory Exam - grossly normal Reflexes:  Coordination - grossly normal Gait - grossly normal Balance - grossly normal Cranial Nerves: I: smell Not tested  II: visual acuity  OS: Normal  OD: Normal   II:  visual fields Full to confrontation  II: pupils Equal, round, reactive to light  III,VII: ptosis None  III,IV,VI: extraocular muscles  Full ROM  V: mastication Normal  V: facial light touch sensation  Normal  V,VII: corneal reflex  Present  VII: facial muscle function - upper  Normal  VII: facial muscle function - lower Normal  VIII: hearing Not tested  IX: soft palate elevation  Normal  IX,X: gag reflex Present  XI: trapezius strength  5/5  XI: sternocleidomastoid strength 5/5  XI: neck flexion strength  5/5  XII: tongue strength  Normal    Data Review Lab Results  Component Value Date   WBC 8.2 04/15/2015   HGB 13.2 04/15/2015   HCT 39.0 04/15/2015   MCV 90.3 04/15/2015   PLT 281 04/15/2015   Lab Results  Component Value Date   NA 141 04/15/2015   K 3.6 04/15/2015   CL 107 04/15/2015   CO2 26 04/15/2015   BUN 21 (H) 04/15/2015   CREATININE 1.09 (H) 04/15/2015   GLUCOSE 111 (H) 04/15/2015   No results found for: INR, PROTIME  Assessment/Plan: T12 and L1 compression fractures, thoracic and lumbar pain: I have discussed the situation with the patient.  We discussed the various treatment options including surgery.  I have described the surgical treatment option of a T12 and L1 kyphoplasty.  I have shown her surgical models.   I have given her a surgical pamphlet.  We have discussed the risk, benefits, alternatives, expected postop course, and likelihood of achieving our goals with surgery.  I have answered all her questions.  She has decided proceed with surgery.   Ophelia Charter 08/19/2021 8:21 AM

## 2021-08-19 NOTE — Op Note (Signed)
Brief history: The patient is an 87 year old osteoporotic white female who fell and suffered T12 and L1 compression fractures.  She failed medical management.  I discussed the various treatment with her.  She has decided proceed with a T12 and L1 kyphoplasty after weighing the risk, benefits and alternatives.  Preop diagnosis: Age-related osteoporosis with pathologic T12 and L1 fracture and delayed healing; thoracic and lumbar pain  Postop diagnosis: The same  Procedure: T12 and L1 kyphoplasty  Surgeon: Dr. Earle Gell  Assistant: None  Anesthesia: General tracheal  Estimated blood loss: Minimal  Specimens: None  Drains: None  Complications: None  Description of procedure: The patient was brought to the operating room by the anesthesia team.  General endotracheal anesthesia was induced.  She was carefully turned to the prone position on the chest rolls.  Her thoracolumbar region was then prepared with Betadine scrub and Betadine solution.  Sterile drapes were applied.  I then injected the area to be incised with Marcaine with epinephrine solution.  I made small incisions over the bilateral T12 and L1 pedicles.  Under biplanar fluoroscopy I cannulated the bilateral T12 pedicles and the right L1 pedicle with a Jamshidi needle.  I made multiple attempts to cannulate the left L1 pedicle but the Jamshidi needle continued to slip off the pedicle.  After multiple unsuccessful attempts on the left I decided to only inject from the right pedicle at L1.  We then removed the trocar, drilled down the Jamshidi needle, remove the drill, remove the drill, inserted balloons bilaterally at T12 and from the right at L1.  We inflated the balloons under biplanar fluoroscopy.  We then deflated and removed the balloons.  We then inserted bone cement via the needles into the bilateral T12 and right L1 pedicles under fluoroscopic guidance.  The cement entered the disc base at T12-L1 from the right.  We stopped  injecting there and withdrew the trocar a bit and allow the cement to harden and then injected more from the right side at T12.  After were satisfied with the bone fell at T12 and L1 we removed the cannulas.  We reapproximated the patient's subcutaneous tissue with interrupted 3-0 Vicryl suture.  We reapproximated the skin with Steri-Strips and benzoin.  The wound was then coated with bacitracin ointment.  A sterile dressing was applied.  The drapes were removed.  By report all sponge, instrument, and needle counts were correct at the end of this case.

## 2021-08-20 ENCOUNTER — Encounter (HOSPITAL_COMMUNITY): Payer: Self-pay | Admitting: Neurosurgery

## 2021-08-27 ENCOUNTER — Other Ambulatory Visit: Payer: Self-pay

## 2021-08-27 ENCOUNTER — Other Ambulatory Visit (HOSPITAL_COMMUNITY): Payer: Self-pay | Admitting: Student

## 2021-08-27 ENCOUNTER — Ambulatory Visit (HOSPITAL_COMMUNITY): Admission: RE | Admit: 2021-08-27 | Payer: Medicare HMO | Source: Ambulatory Visit

## 2021-08-27 ENCOUNTER — Ambulatory Visit (HOSPITAL_COMMUNITY)
Admission: RE | Admit: 2021-08-27 | Discharge: 2021-08-27 | Disposition: A | Discharge status: Home / Self Care | Payer: Medicare HMO | Source: Ambulatory Visit | Attending: Student | Admitting: Student

## 2021-08-27 DIAGNOSIS — M79669 Pain in unspecified lower leg: Secondary | ICD-10-CM | POA: Insufficient documentation

## 2021-08-27 DIAGNOSIS — M7989 Other specified soft tissue disorders: Secondary | ICD-10-CM | POA: Insufficient documentation

## 2021-08-27 NOTE — Progress Notes (Signed)
Lower extremity venous has been completed.   Preliminary results in CV Proc.   Sophia Nash 08/27/2021 3:11 PM

## 2021-09-01 ENCOUNTER — Non-Acute Institutional Stay: Payer: Medicare HMO | Admitting: Orthopedic Surgery

## 2021-09-01 ENCOUNTER — Encounter: Payer: Self-pay | Admitting: Orthopedic Surgery

## 2021-09-01 DIAGNOSIS — E039 Hypothyroidism, unspecified: Secondary | ICD-10-CM | POA: Diagnosis not present

## 2021-09-01 DIAGNOSIS — I10 Essential (primary) hypertension: Secondary | ICD-10-CM

## 2021-09-01 DIAGNOSIS — R69 Illness, unspecified: Secondary | ICD-10-CM | POA: Diagnosis not present

## 2021-09-01 DIAGNOSIS — K219 Gastro-esophageal reflux disease without esophagitis: Secondary | ICD-10-CM | POA: Diagnosis not present

## 2021-09-01 DIAGNOSIS — K580 Irritable bowel syndrome with diarrhea: Secondary | ICD-10-CM

## 2021-09-01 DIAGNOSIS — F339 Major depressive disorder, recurrent, unspecified: Secondary | ICD-10-CM

## 2021-09-01 DIAGNOSIS — F419 Anxiety disorder, unspecified: Secondary | ICD-10-CM

## 2021-09-01 DIAGNOSIS — Z9889 Other specified postprocedural states: Secondary | ICD-10-CM

## 2021-09-01 DIAGNOSIS — E7800 Pure hypercholesterolemia, unspecified: Secondary | ICD-10-CM

## 2021-09-01 NOTE — Progress Notes (Signed)
Location:  [facility] Room Number: 098-J Place of Service:  AL[facility] [phone]) Provider: Yvonna Alanis, NP    Patient Care Team: [name], PA-C as PCP - General (Family Medicine) [name], MD (Inactive) as Consulting Physician (General Surgery)  Extended Emergency Contact Information Primary Emergency Contact: Nash,Sophia Address: [address]          [place], [place] [address] [place] Phone: [phone] Mobile Phone: [phone] Relation: Daughter Secondary Emergency Contact: Sophia Nash Address: [address]          [place], [place] [address] [place] Phone: [phone] Mobile Phone: [phone] Relation: Daughter  Code Status:  Full Code Goals of care: Advanced Directive information Advanced Directives 09/01/2021  Does Patient Have a Medical Advance Directive? Yes  Type of Paramedic of [place];Living will  Does patient want to make changes to medical advance directive? No - Patient declined  Copy of [place] in Chart? Yes - validated most recent copy scanned in chart (See row information)  Pre-existing out of facility DNR order (yellow form or pink MOST form) -     Chief Complaint  Patient presents with   Acute Visit    Back pain    HPI:  Pt is a 87 y.o. female seen today for an acute visit for low back pain.   She recently moved to [place] 08/31/2021.   10/04 she was diagnosed with compression fracture to T11, T12 and L1. 01/18 she underwent elective Kyphoplasty T12 and L1 by Dr. Arnoldo Morale. She tolerated procedure well. 01/25 she noticed increased pain to upper legs. 01/26 bilateral venous doppler negative for DVT. Today she denies middle or low back pain. Reports upper leg pain intermittent, feels sharp at times, L>R, rated 5/10. She has been taking Percocet 5/325 mg twice daily for pain. She only has 2 tablets left. She is also taking  celebrex daily. She plans to transition to tramadol prn for pain and tylenol when Percocet is complete. Ambulating on her own, no recent falls.   HTN- no recent lab work, remains on amlodipine and dyazide HLD- no recent lab work, tries to follow low fat diet, remains on Crestor Depression- reports increased anxiety before move to [place], she feels relieved that the big move is over, very pleasant and happy today, she has tried to wean off Cymbalta without success Anxiety- denies recent panic attacks, was given klonopin after her husband died, reports not using often GERD- denies increased reflux, remains on  IBS with diarrhea- no recent episodes, remains on hyoscyamine Hypothyroidism- no recent lab work, remains on levothyroxine       Past Medical History:  Diagnosis Date   Allergy    Anemia    Anginal pain ([place])    Anxiety    Arthritis    Atypical moles    Breast lump    left   Chronic kidney disease    Colon polyp    Depression    Diverticulitis    04-15-15 no recent issues   Dysrhythmia 01/2020   Edema    legs, hands  bilateral   GERD (gastroesophageal reflux disease)    hx of    Heart murmur 01/2020   Hypertension    Hypothyroidism    Impaired hearing    bilaterally some hearing loss-no hearing aids   Mild cognitive impairment with memory loss 08/02/2000   per Dr Berdine Addison, neuro, 04-15-15 no changes-"stable"   Recurrent  upper respiratory infection (URI)    cold with sinus drainage x several months- saw PCP 10/08/11- chest x ray report on chart.  no fever,resolved, no recent issues as of 04-15-15.   Shoulder joint pain    04-07-15 "tripped-loss balance, fell awkwardly and "wrenched shoulder-left", Known "bad right shoulder"bone on bone"   Skin lesion of breast    hx of right lateral breast  lesion-tx. with multipe rounds oral antibiotic- resolved, no reoccurrence.   Sleep apnea    Thyroid disease    hypothyroidism   Past Surgical History:  Procedure Laterality Date   BREAST  SURGERY     lumpectomy- left-benign   CATARACT EXTRACTION, BILATERAL Bilateral    CHOLECYSTECTOMY     laparoscopic   COLECTOMY  10/20/2011   sigmoid colectomy   COLONOSCOPY     ESOPHAGOGASTRODUODENOSCOPY     EYE SURGERY     bilateral cataracts   HERNIA REPAIR     INCISIONAL HERNIA REPAIR N/A 01/12/2013   Procedure: LAPAROSCOPIC/OPEN REPAIR INCISIONAL HERNIA;  Surgeon: [name], MD;  Location: [facility];  Service: General;  Laterality: N/A;  With Mesh   INCISIONAL HERNIA REPAIR N/A 04/23/2015   Procedure: LAPAROSCOPIC ASSISTED INCISIONAL VENTRAL HERNIA REPAIR;  Surgeon: [name], MD;  Location: [facility];  Service: General;  Laterality: N/A;   INSERTION OF MESH N/A 04/23/2015   Procedure: INSERTION OF MESH;  Surgeon: [name], MD;  Location: [facility];  Service: General;  Laterality: N/A;   JOINT REPLACEMENT  05/2021   left hip   KYPHOPLASTY N/A 08/19/2021   Procedure: KYPHOPLASTY THORACIC TWELVE, LUMBAR ONE;  Surgeon: [name], MD;  Location: [facility];  Service: Neurosurgery;  Laterality: N/A;  KYPHOPLASTY THORACIC TWELVE, LUMBAR ONE   TUBAL LIGATION      Allergies  Allergen Reactions   Penicillins Anaphylaxis   Codeine     Makes pt overly excited.... Anxious/ hyperactive    Nsaids     Burns stomach     Outpatient Encounter Medications as of 09/01/2021  Medication Sig   amLODipine (NORVASC) 5 MG tablet Take 5 mg by mouth daily.   Calcium Carb-Cholecalciferol (CALCIUM 600 + D PO) Take 1 tablet by mouth daily.   Carboxymethylcellul-Glycerin (LUBRICATING EYE DROPS OP) Place 1 drop into both eyes daily as needed (dry eyes).   celecoxib (CELEBREX) 100 MG capsule Take 100 mg by mouth 2 (two) times daily as needed for moderate pain.   clonazePAM (KLONOPIN) 0.5 MG tablet Take 0.5 mg by mouth 2 (two) times daily as needed for anxiety.   DULoxetine (CYMBALTA) 60 MG capsule Take 60 mg by mouth daily.   hyoscyamine (LEVSIN SL) 0.125 MG SL tablet Place 0.125 mg  under the tongue 4 (four) times daily as needed for cramping.   levothyroxine (SYNTHROID) 50 MCG tablet Take 50 mcg by mouth daily before breakfast.   loratadine (CLARITIN) 10 MG tablet Take 10 mg by mouth daily.   Multiple Vitamins-Minerals (PRESERVISION AREDS 2+MULTI VIT PO) Take 1 capsule by mouth 2 (two) times daily.   omeprazole (PRILOSEC) 20 MG capsule Take 20 mg by mouth daily.   oxyCODONE-acetaminophen (PERCOCET/ROXICET) 5-325 MG tablet Take 1-2 tablets by mouth every 4 (four) hours as needed for moderate pain.   rosuvastatin (CRESTOR) 5 MG tablet Take 1 tablet (5 mg total) by mouth daily.   triamterene-hydrochlorothiazide (DYAZIDE) 37.5-25 MG per capsule Take 1 capsule by mouth every morning.    vitamin B-12 (CYANOCOBALAMIN) 100 MCG tablet Take 100 mcg by mouth daily.  No facility-administered encounter medications on file as of 09/01/2021.    Review of Systems  Constitutional:  Negative for activity change, appetite change, chills, fatigue and fever.  HENT:  Negative for hearing loss and trouble swallowing.   Eyes:  Negative for visual disturbance.  Respiratory:  Negative for cough, shortness of breath and wheezing.   Cardiovascular:  Negative for chest pain and leg swelling.  Gastrointestinal:  Positive for diarrhea. Negative for abdominal distention, abdominal pain, constipation, nausea and vomiting.  Genitourinary:  Negative for dysuria, frequency and hematuria.  Musculoskeletal:  Positive for back pain and gait problem.  Skin:  Positive for wound.  Neurological:  Negative for dizziness, weakness and headaches.  Psychiatric/Behavioral:  Positive for dysphoric mood. Negative for confusion, sleep disturbance and suicidal ideas. The patient is nervous/anxious.    Immunization History  Administered Date(s) Administered   Influenza, High Dose Seasonal PF 04/19/2019, 04/18/2020, 06/17/2021   Moderna Covid-19 Vaccine Bivalent Booster 66yrs & up 06/29/2021   Moderna Sars-Covid-2  Vaccination 06/17/2020, 03/09/2021   Pneumococcal Conjugate-13 12/20/2013   Zoster Recombinat (Shingrix) 01/30/2014, 04/18/2021   Pertinent  Health Maintenance Due  Topic Date Due   DEXA SCAN  Never done   INFLUENZA VACCINE  Completed   Fall Risk 08/19/2021  Patient Fall Risk Level High fall risk   Functional Status Survey:    Vitals:   09/01/21 0957  BP: (!) 148/62  Pulse: 75  Resp: 16  Temp: (!) 97.4 F (36.3 C)  SpO2: 93%   There is no height or weight on file to calculate BMI. Physical Exam Vitals reviewed.  Constitutional:      General: She is not in acute distress. HENT:     Head: Normocephalic.  Eyes:     General:        Right eye: No discharge.        Left eye: No discharge.  Neck:     Vascular: No carotid bruit.  Cardiovascular:     Rate and Rhythm: Normal rate and regular rhythm.     Pulses: Normal pulses.     Heart sounds: Normal heart sounds. No murmur heard. Pulmonary:     Effort: Pulmonary effort is normal. No respiratory distress.     Breath sounds: Normal breath sounds. No wheezing.  Abdominal:     General: Bowel sounds are normal. There is no distension.     Palpations: Abdomen is soft.     Tenderness: There is no abdominal tenderness.  Musculoskeletal:     Cervical back: Normal and normal range of motion.     Thoracic back: Normal.     Lumbar back: Normal.     Right lower leg: No edema.     Left lower leg: No edema.     Comments: Surgical incision x 2 to middle lower back healed, no sign of infection  Lymphadenopathy:     Cervical: No cervical adenopathy.  Skin:    General: Skin is warm and dry.     Capillary Refill: Capillary refill takes less than 2 seconds.  Neurological:     General: No focal deficit present.     Mental Status: She is alert and oriented to person, place, and time.     Motor: No weakness.     Gait: Gait normal.  Psychiatric:        Mood and Affect: Mood normal.        Behavior: Behavior normal.    Labs  reviewed: No results for input(s): NA, K, CL,  CO2, GLUCOSE, BUN, CREATININE, CALCIUM, MG, PHOS in the last 8760 hours. No results for input(s): AST, ALT, ALKPHOS, BILITOT, PROT, ALBUMIN in the last 8760 hours. No results for input(s): WBC, NEUTROABS, HGB, HCT, MCV, PLT in the last 8760 hours. No results found for: TSH No results found for: HGBA1C No results found for: CHOL, HDL, LDLCALC, LDLDIRECT, TRIG, CHOLHDL  Significant Diagnostic Results in last 30 days:  DG THORACOLUMABAR SPINE  Result Date: 08/19/2021 CLINICAL DATA:  Kyphoplasty at T12 and L1. EXAM: THORACOLUMBAR SPINE 1V COMPARISON:  Radiographs of the thoracolumbar spine 07/14/2021. Lumbar MRI 05/07/2021. FINDINGS: C-arm fluoroscopy provided in the operating room. 3 minutes and 43 seconds of fluoroscopy time. 113.88 mGy. Two spot fluoroscopic images are submitted, demonstrating spinal augmentation changes within the T12 and L1 vertebral bodies. A small amount of the methylmethacrylate appears to extend into the T12-L1 disc, although no intraspinal extension is identified. IMPRESSION: Intraoperative views following spinal augmentation at T12 and L1. Electronically Signed   By: Richardean Sale M.D.   On: 08/19/2021 11:21   DG C-Arm 1-60 Min-No Report  Result Date: 08/19/2021 Fluoroscopy was utilized by the requesting physician.  No radiographic interpretation.   DG C-Arm 1-60 Min-No Report  Result Date: 08/19/2021 Fluoroscopy was utilized by the requesting physician.  No radiographic interpretation.   VAS [place] LOWER EXTREMITY VENOUS (DVT)  Result Date: 08/28/2021  Lower Venous DVT Study Patient Name:  Sophia Nash  Date of Exam:   08/27/2021 Medical Rec #: [id number]         Accession #:    [id number] Date of Birth: [birth year]         Patient Gender: F Patient Age:   69 years Exam Location:  [facility] Procedure:      VAS [place] LOWER EXTREMITY VENOUS (DVT) Referring Phys: [facility] BERGMAN  --------------------------------------------------------------------------------  Indications: Pain, and Swelling.  Comparison Study: no prior Performing Technologist: Archie Patten RVS  Examination Guidelines: A complete evaluation includes B-mode imaging, spectral Doppler, color Doppler, and power Doppler as needed of all accessible portions of each vessel. Bilateral testing is considered an integral part of a complete examination. Limited examinations for reoccurring indications may be performed as noted. The reflux portion of the exam is performed with the patient in reverse Trendelenburg.  +---------+---------------+---------+-----------+----------+--------------+  RIGHT     Compressibility Phasicity Spontaneity Properties Thrombus Aging  +---------+---------------+---------+-----------+----------+--------------+  CFV       Full            Yes       Yes                                    +---------+---------------+---------+-----------+----------+--------------+  SFJ       Full                                                             +---------+---------------+---------+-----------+----------+--------------+  FV Prox   Full                                                             +---------+---------------+---------+-----------+----------+--------------+  FV Mid    Full                                                             +---------+---------------+---------+-----------+----------+--------------+  FV Distal Full                                                             +---------+---------------+---------+-----------+----------+--------------+  PFV       Full                                                             +---------+---------------+---------+-----------+----------+--------------+  POP       Full            Yes       Yes                                    +---------+---------------+---------+-----------+----------+--------------+  PTV       Full                                                              +---------+---------------+---------+-----------+----------+--------------+  PERO      Full                                                             +---------+---------------+---------+-----------+----------+--------------+   +---------+---------------+---------+-----------+----------+--------------+  LEFT      Compressibility Phasicity Spontaneity Properties Thrombus Aging  +---------+---------------+---------+-----------+----------+--------------+  CFV       Full            Yes       Yes                                    +---------+---------------+---------+-----------+----------+--------------+  SFJ       Full                                                             +---------+---------------+---------+-----------+----------+--------------+  FV Prox   Full                                                             +---------+---------------+---------+-----------+----------+--------------+    FV Mid    Full                                                             +---------+---------------+---------+-----------+----------+--------------+  FV Distal Full                                                             +---------+---------------+---------+-----------+----------+--------------+  PFV       Full                                                             +---------+---------------+---------+-----------+----------+--------------+  POP       Full            Yes       Yes                                    +---------+---------------+---------+-----------+----------+--------------+  PTV       Full                                                             +---------+---------------+---------+-----------+----------+--------------+  PERO      Full                                                             +---------+---------------+---------+-----------+----------+--------------+     Summary: BILATERAL: - No evidence of deep vein thrombosis seen in the lower extremities,  bilaterally. -No evidence of popliteal cyst, bilaterally.   *See table(s) above for measurements and observations. Electronically signed by Jamelle Haring on 08/28/2021 at 5:04:42 AM.    Final     Assessment/Plan 1. S/P kyphoplasty - procedure 01/18 to T12 and L1 - followed by Dr. Arnoldo Morale - continues to have pain to upper legs - 01/26 venous doppler negative - recommend transitioning from Percocet to Tramadol - cont tylenol prn - recommend light walking daily  2. Essential hypertension - controlled - no recent labs - cont amlodipine and dyazide - cmp - cbc/diff  3. Pure hypercholesterolemia - LDL unknown - cont Crestor - lipid panel  4. Depression, recurrent ([place]) - no mood changes - unsuccessful weaning of Cymbalta in past - cont Cymbalta  5. Anxiety - no recent panic attacks - cont klonopin  6. Gastroesophageal reflux disease without esophagitis - stable with Prilosec  7. Irritable bowel syndrome with diarrhea - no recent flares - cont hyoscyamine  8. Hypothyroidism, unspecified type - cont levothyroxine - tsh  Family/ staff Communication: plan discussed with patient and nurse  Labs/tests ordered:  cbc/diff, cmp, lipid panel, tsh

## 2021-09-02 DIAGNOSIS — Z9889 Other specified postprocedural states: Secondary | ICD-10-CM | POA: Insufficient documentation

## 2021-09-02 DIAGNOSIS — F339 Major depressive disorder, recurrent, unspecified: Secondary | ICD-10-CM | POA: Insufficient documentation

## 2021-09-02 DIAGNOSIS — F419 Anxiety disorder, unspecified: Secondary | ICD-10-CM | POA: Insufficient documentation

## 2021-09-02 DIAGNOSIS — K580 Irritable bowel syndrome with diarrhea: Secondary | ICD-10-CM | POA: Insufficient documentation

## 2021-09-02 DIAGNOSIS — K219 Gastro-esophageal reflux disease without esophagitis: Secondary | ICD-10-CM | POA: Insufficient documentation

## 2021-09-02 DIAGNOSIS — E7800 Pure hypercholesterolemia, unspecified: Secondary | ICD-10-CM | POA: Insufficient documentation

## 2021-09-02 DIAGNOSIS — I10 Essential (primary) hypertension: Secondary | ICD-10-CM | POA: Insufficient documentation

## 2021-09-02 DIAGNOSIS — E039 Hypothyroidism, unspecified: Secondary | ICD-10-CM | POA: Insufficient documentation

## 2021-09-16 DIAGNOSIS — M5459 Other low back pain: Secondary | ICD-10-CM | POA: Diagnosis not present

## 2021-09-16 DIAGNOSIS — M25651 Stiffness of right hip, not elsewhere classified: Secondary | ICD-10-CM | POA: Diagnosis not present

## 2021-09-16 DIAGNOSIS — S32010S Wedge compression fracture of first lumbar vertebra, sequela: Secondary | ICD-10-CM | POA: Diagnosis not present

## 2021-09-16 DIAGNOSIS — R278 Other lack of coordination: Secondary | ICD-10-CM | POA: Diagnosis not present

## 2021-09-16 DIAGNOSIS — R2689 Other abnormalities of gait and mobility: Secondary | ICD-10-CM | POA: Diagnosis not present

## 2021-09-16 DIAGNOSIS — Z9181 History of falling: Secondary | ICD-10-CM | POA: Diagnosis not present

## 2021-09-16 DIAGNOSIS — M25652 Stiffness of left hip, not elsewhere classified: Secondary | ICD-10-CM | POA: Diagnosis not present

## 2021-09-16 DIAGNOSIS — M1389 Other specified arthritis, multiple sites: Secondary | ICD-10-CM | POA: Diagnosis not present

## 2021-09-17 DIAGNOSIS — R2689 Other abnormalities of gait and mobility: Secondary | ICD-10-CM | POA: Diagnosis not present

## 2021-09-17 DIAGNOSIS — R278 Other lack of coordination: Secondary | ICD-10-CM | POA: Diagnosis not present

## 2021-09-17 DIAGNOSIS — M25651 Stiffness of right hip, not elsewhere classified: Secondary | ICD-10-CM | POA: Diagnosis not present

## 2021-09-17 DIAGNOSIS — S32010S Wedge compression fracture of first lumbar vertebra, sequela: Secondary | ICD-10-CM | POA: Diagnosis not present

## 2021-09-17 DIAGNOSIS — M5459 Other low back pain: Secondary | ICD-10-CM | POA: Diagnosis not present

## 2021-09-17 DIAGNOSIS — M25652 Stiffness of left hip, not elsewhere classified: Secondary | ICD-10-CM | POA: Diagnosis not present

## 2021-09-17 DIAGNOSIS — Z9181 History of falling: Secondary | ICD-10-CM | POA: Diagnosis not present

## 2021-09-17 DIAGNOSIS — M1389 Other specified arthritis, multiple sites: Secondary | ICD-10-CM | POA: Diagnosis not present

## 2021-09-22 DIAGNOSIS — R278 Other lack of coordination: Secondary | ICD-10-CM | POA: Diagnosis not present

## 2021-09-22 DIAGNOSIS — M5459 Other low back pain: Secondary | ICD-10-CM | POA: Diagnosis not present

## 2021-09-22 DIAGNOSIS — S32010S Wedge compression fracture of first lumbar vertebra, sequela: Secondary | ICD-10-CM | POA: Diagnosis not present

## 2021-09-22 DIAGNOSIS — M25652 Stiffness of left hip, not elsewhere classified: Secondary | ICD-10-CM | POA: Diagnosis not present

## 2021-09-22 DIAGNOSIS — M1389 Other specified arthritis, multiple sites: Secondary | ICD-10-CM | POA: Diagnosis not present

## 2021-09-22 DIAGNOSIS — Z9181 History of falling: Secondary | ICD-10-CM | POA: Diagnosis not present

## 2021-09-22 DIAGNOSIS — M25651 Stiffness of right hip, not elsewhere classified: Secondary | ICD-10-CM | POA: Diagnosis not present

## 2021-09-22 DIAGNOSIS — R2689 Other abnormalities of gait and mobility: Secondary | ICD-10-CM | POA: Diagnosis not present

## 2021-09-24 DIAGNOSIS — M5459 Other low back pain: Secondary | ICD-10-CM | POA: Diagnosis not present

## 2021-09-24 DIAGNOSIS — S32010S Wedge compression fracture of first lumbar vertebra, sequela: Secondary | ICD-10-CM | POA: Diagnosis not present

## 2021-09-24 DIAGNOSIS — R2689 Other abnormalities of gait and mobility: Secondary | ICD-10-CM | POA: Diagnosis not present

## 2021-09-24 DIAGNOSIS — M25652 Stiffness of left hip, not elsewhere classified: Secondary | ICD-10-CM | POA: Diagnosis not present

## 2021-09-24 DIAGNOSIS — M25651 Stiffness of right hip, not elsewhere classified: Secondary | ICD-10-CM | POA: Diagnosis not present

## 2021-09-24 DIAGNOSIS — R278 Other lack of coordination: Secondary | ICD-10-CM | POA: Diagnosis not present

## 2021-09-24 DIAGNOSIS — M1389 Other specified arthritis, multiple sites: Secondary | ICD-10-CM | POA: Diagnosis not present

## 2021-09-24 DIAGNOSIS — Z9181 History of falling: Secondary | ICD-10-CM | POA: Diagnosis not present

## 2021-09-29 ENCOUNTER — Telehealth: Payer: Self-pay

## 2021-09-29 ENCOUNTER — Other Ambulatory Visit: Payer: Self-pay | Admitting: Orthopedic Surgery

## 2021-09-29 DIAGNOSIS — E039 Hypothyroidism, unspecified: Secondary | ICD-10-CM

## 2021-09-29 MED ORDER — LEVOTHYROXINE SODIUM 50 MCG PO TABS: 50.0000 ug | ORAL_TABLET | Freq: Every day | ORAL | 1 refills | Status: DC

## 2021-09-29 NOTE — Telephone Encounter (Signed)
I saw her on 09/01/2021, its fine to refill. I will send script.

## 2021-09-29 NOTE — Telephone Encounter (Signed)
Patient has a pending to establish care with Dr.Gupta/Christy Wert, NP on 10/12/2021 and states she needs a refill on her levothyroxine in which she has taken x 52 years.  Due to the fact that patient is not established with our office at the present moment I will send to provider to advise.

## 2021-09-29 NOTE — Telephone Encounter (Signed)
Patient aware rx to be sent and pharmacy was updated in system

## 2021-09-30 DIAGNOSIS — S32010S Wedge compression fracture of first lumbar vertebra, sequela: Secondary | ICD-10-CM | POA: Diagnosis not present

## 2021-09-30 DIAGNOSIS — R278 Other lack of coordination: Secondary | ICD-10-CM | POA: Diagnosis not present

## 2021-09-30 DIAGNOSIS — M5459 Other low back pain: Secondary | ICD-10-CM | POA: Diagnosis not present

## 2021-09-30 DIAGNOSIS — M25651 Stiffness of right hip, not elsewhere classified: Secondary | ICD-10-CM | POA: Diagnosis not present

## 2021-09-30 DIAGNOSIS — Z9181 History of falling: Secondary | ICD-10-CM | POA: Diagnosis not present

## 2021-09-30 DIAGNOSIS — R2689 Other abnormalities of gait and mobility: Secondary | ICD-10-CM | POA: Diagnosis not present

## 2021-09-30 DIAGNOSIS — M1389 Other specified arthritis, multiple sites: Secondary | ICD-10-CM | POA: Diagnosis not present

## 2021-09-30 DIAGNOSIS — M25652 Stiffness of left hip, not elsewhere classified: Secondary | ICD-10-CM | POA: Diagnosis not present

## 2021-10-12 ENCOUNTER — Non-Acute Institutional Stay: Payer: Medicare HMO | Admitting: Adult Health

## 2021-10-12 ENCOUNTER — Encounter: Payer: Self-pay | Admitting: Adult Health

## 2021-10-12 ENCOUNTER — Other Ambulatory Visit: Payer: Self-pay

## 2021-10-12 VITALS — BP 138/86 | HR 84 | Temp 98.1°F | Ht 64.0 in | Wt 175.4 lb

## 2021-10-12 DIAGNOSIS — I10 Essential (primary) hypertension: Secondary | ICD-10-CM

## 2021-10-12 DIAGNOSIS — F339 Major depressive disorder, recurrent, unspecified: Secondary | ICD-10-CM

## 2021-10-12 DIAGNOSIS — K5901 Slow transit constipation: Secondary | ICD-10-CM | POA: Diagnosis not present

## 2021-10-12 DIAGNOSIS — E7800 Pure hypercholesterolemia, unspecified: Secondary | ICD-10-CM

## 2021-10-12 DIAGNOSIS — S32010D Wedge compression fracture of first lumbar vertebra, subsequent encounter for fracture with routine healing: Secondary | ICD-10-CM | POA: Diagnosis not present

## 2021-10-12 DIAGNOSIS — K580 Irritable bowel syndrome with diarrhea: Secondary | ICD-10-CM

## 2021-10-12 DIAGNOSIS — E039 Hypothyroidism, unspecified: Secondary | ICD-10-CM | POA: Diagnosis not present

## 2021-10-12 DIAGNOSIS — E2839 Other primary ovarian failure: Secondary | ICD-10-CM

## 2021-10-12 DIAGNOSIS — K219 Gastro-esophageal reflux disease without esophagitis: Secondary | ICD-10-CM | POA: Diagnosis not present

## 2021-10-12 DIAGNOSIS — R69 Illness, unspecified: Secondary | ICD-10-CM | POA: Diagnosis not present

## 2021-10-12 DIAGNOSIS — F419 Anxiety disorder, unspecified: Secondary | ICD-10-CM

## 2021-10-12 MED ORDER — LUBRICATING EYE DROPS 0.5-0.9 % OP SOLN: 1.0000 [drp] | Freq: Every day | OPHTHALMIC | 0 refills | Status: DC | PRN

## 2021-10-12 NOTE — Patient Instructions (Signed)
Recommend tdap vaccine ? ?Recommend dexa scan ? ?Miralax 17 grams daily due to constipation from ultram ?

## 2021-10-12 NOTE — Progress Notes (Signed)
Location:  [facility]  POS: clinic  Provider:  Cindi Carbon, [place] [phone]   Code Status: Full Goals of Care:  Advanced Directives 10/12/2021  Does Patient Have a Medical Advance Directive? Yes  Type of Paramedic of [place];Living will  Does patient want to make changes to medical advance directive? No - Patient declined  Copy of [place] in Chart? Yes - validated most recent copy scanned in chart (See row information)  Pre-existing out of facility DNR order (yellow form or pink MOST form) -     Chief Complaint  Patient presents with   Medical Management of Chronic Issues    Patient here today to establish care.    Quality Metric Gaps    TDAP and dexa are due according to matrix and [facility]    HPI: Patient is a 87 y.o. female seen today for medical management of chronic diseases.   [place] due [place] which led to fractures in her spine of L1. She had kyphoplasty 08/19/21.  She does not feel her pain has improved since her surgery. She is going to see the surgeon again soon and the pain clinic. Takes ultram 1 every 4 hrs. Has come off percocet.  Was trying to get a steroid injection but insurance denied it.   Reports constipation tried miralax which helps.   Administers her own meds. MMSE 30/30  Continues to work with therapy due to back pain. Using a walker.   Has macular degeneration. Has not been driving due to her fall. Doesn't drive at night.   Fell yesterday on a table and hit her knee and left thumb. Has a small laceration on her left thumb. Fall was mechanical in nature.   Has significant arthritis in hands and back. Left hand has been numb since 1.5 years ago after hip surgery.Has some neck pain when she wakes up in the morning. Sleeps flat with supportive pillow. Denies any shoulder pain.   Has a heart murmur, takes clindamycin prior to dental apts.   Her teeth are breaking off  regularly. Unclear reason.   Past Medical History:  Diagnosis Date   Allergy    Anemia    Anginal pain ([place])    Anxiety    Arthritis    Atypical moles    Breast lump    left   Chronic kidney disease    Colon polyp    Depression    Diverticulitis    04-15-15 no recent issues   Dysrhythmia 01/2020   Edema    legs, hands  bilateral   GERD (gastroesophageal reflux disease)    hx of    Heart murmur 01/2020   High cholesterol    Per new patient form   Hypertension    Hypothyroidism    IBS (irritable bowel syndrome)    Per new patient form   Impaired hearing    bilaterally some hearing loss-no hearing aids   Lactose intolerance    Per new patient form   Macular degeneration    Per new patient form   Mild cognitive impairment with memory loss 08/02/2000   per Dr Berdine Addison, neuro, 04-15-15 no changes-"stable"   Recurrent upper respiratory infection (URI)    cold with sinus drainage x several months- saw PCP 10/08/11- chest x ray report on chart.  no fever,resolved, no recent issues as of 04-15-15.   Shoulder joint pain    04-07-15 "tripped-loss balance, fell awkwardly and "  wrenched shoulder-left", Known "bad right shoulder"bone on bone"   Skin lesion of breast    hx of right lateral breast  lesion-tx. with multipe rounds oral antibiotic- resolved, no reoccurrence.   Sleep apnea    Thyroid disease    hypothyroidism    Past Surgical History:  Procedure Laterality Date   BREAST SURGERY     lumpectomy- left-benign   CATARACT EXTRACTION, BILATERAL Bilateral    CHOLECYSTECTOMY     laparoscopic   COLECTOMY  10/20/2011   sigmoid colectomy   COLONOSCOPY  2018   DG  BONE DENSITY ([place] HX)  2021   [name], PA; [facility]/ Per new patient form   ESOPHAGOGASTRODUODENOSCOPY     EYE SURGERY     bilateral cataracts   HERNIA REPAIR     INCISIONAL HERNIA REPAIR N/A 01/12/2013   Procedure: LAPAROSCOPIC/OPEN REPAIR INCISIONAL HERNIA;  Surgeon: [name], MD;  Location: [facility];   Service: General;  Laterality: N/A;  With Mesh   INCISIONAL HERNIA REPAIR N/A 04/23/2015   Procedure: LAPAROSCOPIC ASSISTED INCISIONAL VENTRAL HERNIA REPAIR;  Surgeon: [name], MD;  Location: [facility];  Service: General;  Laterality: N/A;   INSERTION OF MESH N/A 04/23/2015   Procedure: INSERTION OF MESH;  Surgeon: [name], MD;  Location: [facility];  Service: General;  Laterality: N/A;   JOINT REPLACEMENT  05/2021   left hip   KYPHOPLASTY N/A 08/19/2021   Procedure: KYPHOPLASTY THORACIC TWELVE, LUMBAR ONE;  Surgeon: [name], MD;  Location: [facility];  Service: Neurosurgery;  Laterality: N/A;  KYPHOPLASTY THORACIC TWELVE, LUMBAR ONE   TOTAL HIP ARTHROPLASTY Left 2022   [facility], [place]; Per new patient form   TUBAL LIGATION  1972    Allergies  Allergen Reactions   Penicillins Anaphylaxis   Codeine     Makes pt overly excited.... Anxious/ hyperactive    Nsaids     Burns stomach     Outpatient Encounter Medications as of 10/12/2021  Medication Sig   amLODipine (NORVASC) 5 MG tablet Take 5 mg by mouth daily.   Calcium Carb-Cholecalciferol (CALCIUM 600 + D PO) Take 1 tablet by mouth daily.   Carboxymethylcellul-Glycerin (LUBRICATING EYE DROPS OP) Place 1 drop into both eyes daily as needed (dry eyes).   celecoxib (CELEBREX) 100 MG capsule Take 100 mg by mouth 2 (two) times daily as needed for moderate pain.   Cetirizine HCl (ZYRTEC PO) Take by mouth daily.   Cholecalciferol (VITAMIN D3 PO) Take 30,000 Units by mouth once a week.   clonazePAM (KLONOPIN) 0.5 MG tablet Take 0.5 mg by mouth 2 (two) times daily as needed for anxiety.   Cyanocobalamin (VITAMIN B 12) 500 MCG TABS Take 500 mcg by mouth daily.   DULoxetine (CYMBALTA) 60 MG capsule Take 60 mg by mouth daily.   hyoscyamine (LEVSIN SL) 0.125 MG SL tablet Place 0.125 mg under the tongue 4 (four) times daily as needed for cramping.   levothyroxine (SYNTHROID) 50 MCG tablet Take 1 tablet (50 mcg  total) by mouth daily before breakfast.   Multiple Vitamins-Minerals (PRESERVISION AREDS 2+MULTI VIT PO) Take 1 capsule by mouth 2 (two) times daily.   omeprazole (PRILOSEC) 20 MG capsule Take 20 mg by mouth daily.   Probiotic Product (PROBIOTIC DAILY PO) Take by mouth daily.   rosuvastatin (CRESTOR) 5 MG tablet Take 1 tablet (5 mg total) by mouth daily.   traMADol (ULTRAM) 50 MG tablet Take 50 mg by mouth every 4 (four) hours as needed.  triamterene-hydrochlorothiazide (DYAZIDE) 37.5-25 MG per capsule Take 1 capsule by mouth every morning.    [DISCONTINUED] Cholecalciferol (D3 5000 PO) Take by mouth daily.   No facility-administered encounter medications on file as of 10/12/2021.    Review of Systems:  Review of Systems  Constitutional:  Positive for activity change. Negative for appetite change, chills, diaphoresis, fatigue, fever and unexpected weight change.  HENT:  Negative for congestion.        Teeth breaking off  Respiratory:  Negative for cough, shortness of breath and wheezing.   Cardiovascular:  Negative for chest pain, palpitations and leg swelling.  Gastrointestinal:  Positive for constipation. Negative for abdominal distention, abdominal pain and diarrhea.  Genitourinary:  Negative for difficulty urinating and dysuria.  Musculoskeletal:  Positive for back pain and gait problem. Negative for arthralgias, joint swelling and myalgias.  Neurological:  Negative for dizziness, tremors, seizures, syncope, facial asymmetry, speech difficulty, weakness, light-headedness, numbness and headaches.  Psychiatric/Behavioral:  Negative for agitation, behavioral problems and confusion.    Health Maintenance  Topic Date Due   TETANUS/TDAP  Never done   DEXA SCAN  Never done   Pneumonia Vaccine 52+ Years old  Completed   INFLUENZA VACCINE  Completed   COVID-19 Vaccine  Completed   Zoster Vaccines- Shingrix  Completed   HPV VACCINES  Aged Out    Physical Exam: Vitals:   10/12/21 1507   BP: 138/86  Pulse: 84  Temp: 98.1 F (36.7 C)  SpO2: 98%  Weight: 175 lb 6.4 oz (79.6 kg)  Height: '5\' 4"'$  (1.626 m)   Body mass index is 30.11 kg/m. Physical Exam Vitals and nursing note reviewed.  Constitutional:      General: She is not in acute distress.    Appearance: She is not diaphoretic.  HENT:     Head: Normocephalic and atraumatic.     Nose: Nose normal.     Mouth/Throat:     Mouth: Mucous membranes are moist.     Pharynx: Oropharynx is clear.  Eyes:     Conjunctiva/sclera: Conjunctivae normal.     Pupils: Pupils are equal, round, and reactive to light.  Neck:     Vascular: No JVD.  Cardiovascular:     Rate and Rhythm: Normal rate and regular rhythm.     Heart sounds: No murmur heard. Pulmonary:     Effort: Pulmonary effort is normal. No respiratory distress.     Breath sounds: Normal breath sounds. No wheezing.  Musculoskeletal:     Cervical back: No rigidity or tenderness.     Right lower leg: No edema.     Left lower leg: No edema.  Lymphadenopathy:     Cervical: No cervical adenopathy.  Skin:    General: Skin is warm and dry.  Neurological:     Mental Status: She is alert and oriented to person, place, and time.  Psychiatric:        Mood and Affect: Mood normal.    Labs reviewed: Basic Metabolic Panel: No results for input(s): NA, K, CL, CO2, GLUCOSE, BUN, CREATININE, CALCIUM, MG, PHOS, TSH in the last 8760 hours. Liver Function Tests: No results for input(s): AST, ALT, ALKPHOS, BILITOT, PROT, ALBUMIN in the last 8760 hours. No results for input(s): LIPASE, AMYLASE in the last 8760 hours. No results for input(s): AMMONIA in the last 8760 hours. CBC: No results for input(s): WBC, NEUTROABS, HGB, HCT, MCV, PLT in the last 8760 hours. Lipid Panel: No results for input(s): CHOL, HDL, LDLCALC, TRIG, CHOLHDL,  LDLDIRECT in the last 8760 hours. No results found for: HGBA1C  Procedures since last visit: No results found.  Assessment/Plan  1.  Compression fracture of L1 vertebra with routine healing, subsequent encounter S/p kyphoplasty of T12 and L1 Continue ultram for pain as well as celebrex Will f/u with surgery and pain clinic  2. Essential hypertension Controlled  Continue Dyazide and norvasc   3. Irritable bowel syndrome with diarrhea Continue probiotic and hycosamine   4. Gastroesophageal reflux disease without esophagitis Continue prilosec 20 mg qd Would not not taper due to regular celebrex use  5. Hypothyroidism, unspecified type TSH 0.905 07/21/21 care everywhere Continue Synthroid  6. Pure hypercholesterolemia LDL 99 in care everywhere documents 07/21/21 Continue crestor  7. Depression, recurrent ([place]) Continue cymbalta  8. Anxiety Continue clonazepam 0.5 mg bid prn   9. Slow transit constipation Miralax 17 grams daily due to constipation from ultram  10. Estrogen deficiency Recommend dexa scan   Labs/tests ordered:  * No order type specified *CBC TSH Lipid CMP Next appt:  3 months  Recommend tdap vaccine   Total time 60 min:  time greater than 50% of total time spent doing pt counseling and coordination of care

## 2021-10-13 DIAGNOSIS — R278 Other lack of coordination: Secondary | ICD-10-CM | POA: Diagnosis not present

## 2021-10-13 DIAGNOSIS — M1389 Other specified arthritis, multiple sites: Secondary | ICD-10-CM | POA: Diagnosis not present

## 2021-10-13 DIAGNOSIS — M5459 Other low back pain: Secondary | ICD-10-CM | POA: Diagnosis not present

## 2021-10-13 DIAGNOSIS — S32010S Wedge compression fracture of first lumbar vertebra, sequela: Secondary | ICD-10-CM | POA: Diagnosis not present

## 2021-10-13 DIAGNOSIS — M25652 Stiffness of left hip, not elsewhere classified: Secondary | ICD-10-CM | POA: Diagnosis not present

## 2021-10-13 DIAGNOSIS — M25651 Stiffness of right hip, not elsewhere classified: Secondary | ICD-10-CM | POA: Diagnosis not present

## 2021-10-13 DIAGNOSIS — R2689 Other abnormalities of gait and mobility: Secondary | ICD-10-CM | POA: Diagnosis not present

## 2021-10-13 DIAGNOSIS — Z9181 History of falling: Secondary | ICD-10-CM | POA: Diagnosis not present

## 2021-10-16 ENCOUNTER — Telehealth: Payer: Self-pay | Admitting: Adult Health

## 2021-10-16 DIAGNOSIS — M545 Low back pain, unspecified: Secondary | ICD-10-CM | POA: Diagnosis not present

## 2021-10-16 DIAGNOSIS — M546 Pain in thoracic spine: Secondary | ICD-10-CM | POA: Diagnosis not present

## 2021-10-16 DIAGNOSIS — M25551 Pain in right hip: Secondary | ICD-10-CM | POA: Diagnosis not present

## 2021-10-16 NOTE — Telephone Encounter (Signed)
But when she has taken norco in the past she felt "jittery".  She does not want to start anything now. Will f/u with pain clinic next week.  ?

## 2021-10-16 NOTE — Telephone Encounter (Signed)
Nurse called to report Sophia Nash back pain is still present and unrelieved with ultram. She had a fall earlier this week. She had her thoracic, lumbar spine and hip xrayed which were neg. She is going to see Dr Arnoldo Morale and the pain clinic as well. Requesting further med for pain relief. Norco ordered for 5 days ?

## 2021-10-20 DIAGNOSIS — Z9889 Other specified postprocedural states: Secondary | ICD-10-CM | POA: Diagnosis not present

## 2021-10-20 DIAGNOSIS — G894 Chronic pain syndrome: Secondary | ICD-10-CM | POA: Diagnosis not present

## 2021-10-20 DIAGNOSIS — M47816 Spondylosis without myelopathy or radiculopathy, lumbar region: Secondary | ICD-10-CM | POA: Diagnosis not present

## 2021-10-29 ENCOUNTER — Other Ambulatory Visit: Payer: Self-pay | Admitting: Internal Medicine

## 2021-10-29 DIAGNOSIS — M47816 Spondylosis without myelopathy or radiculopathy, lumbar region: Secondary | ICD-10-CM | POA: Diagnosis not present

## 2021-10-29 DIAGNOSIS — Z1231 Encounter for screening mammogram for malignant neoplasm of breast: Secondary | ICD-10-CM

## 2021-10-29 DIAGNOSIS — M810 Age-related osteoporosis without current pathological fracture: Secondary | ICD-10-CM

## 2021-11-27 DIAGNOSIS — S32010D Wedge compression fracture of first lumbar vertebra, subsequent encounter for fracture with routine healing: Secondary | ICD-10-CM | POA: Diagnosis not present

## 2021-11-27 DIAGNOSIS — Z9889 Other specified postprocedural states: Secondary | ICD-10-CM | POA: Diagnosis not present

## 2021-11-27 DIAGNOSIS — Z6829 Body mass index (BMI) 29.0-29.9, adult: Secondary | ICD-10-CM | POA: Diagnosis not present

## 2021-12-09 ENCOUNTER — Other Ambulatory Visit: Payer: Self-pay | Admitting: *Deleted

## 2021-12-09 NOTE — Telephone Encounter (Signed)
Patient requested refills.  ?Pended Rx's and sent to [facility] for approval.  ?

## 2021-12-10 MED ORDER — CLONAZEPAM 0.5 MG PO TABS: 0.5000 mg | ORAL_TABLET | Freq: Two times a day (BID) | ORAL | 1 refills | Status: DC | PRN

## 2021-12-10 MED ORDER — HYOSCYAMINE SULFATE 0.125 MG SL SUBL: 0.1250 mg | SUBLINGUAL_TABLET | Freq: Four times a day (QID) | SUBLINGUAL | 1 refills | Status: DC | PRN

## 2021-12-17 ENCOUNTER — Encounter: Payer: Medicare HMO | Admitting: Orthopedic Surgery

## 2022-01-01 ENCOUNTER — Other Ambulatory Visit: Payer: Self-pay | Admitting: Adult Health

## 2022-01-06 DIAGNOSIS — Z9849 Cataract extraction status, unspecified eye: Secondary | ICD-10-CM | POA: Diagnosis not present

## 2022-01-06 DIAGNOSIS — H5201 Hypermetropia, right eye: Secondary | ICD-10-CM | POA: Diagnosis not present

## 2022-01-06 DIAGNOSIS — H353132 Nonexudative age-related macular degeneration, bilateral, intermediate dry stage: Secondary | ICD-10-CM | POA: Diagnosis not present

## 2022-01-06 DIAGNOSIS — Z0100 Encounter for examination of eyes and vision without abnormal findings: Secondary | ICD-10-CM | POA: Diagnosis not present

## 2022-01-06 DIAGNOSIS — H524 Presbyopia: Secondary | ICD-10-CM | POA: Diagnosis not present

## 2022-01-06 DIAGNOSIS — H538 Other visual disturbances: Secondary | ICD-10-CM | POA: Diagnosis not present

## 2022-01-06 DIAGNOSIS — H5212 Myopia, left eye: Secondary | ICD-10-CM | POA: Diagnosis not present

## 2022-01-06 DIAGNOSIS — Z961 Presence of intraocular lens: Secondary | ICD-10-CM | POA: Diagnosis not present

## 2022-01-06 DIAGNOSIS — H52223 Regular astigmatism, bilateral: Secondary | ICD-10-CM | POA: Diagnosis not present

## 2022-01-07 IMAGING — CT CT CARDIAC CORONARY ARTERY CALCIUM SCORE
3 series · 14 of 20 positions shown, 15 images · non-contrast
Comparison: None.
COMPARISON: None.

Addendum:
EXAM:
OVER-READ INTERPRETATION  CT CHEST

The following report is an over-read performed by radiologist Dr.
Gabbi Nair [REDACTED] on 01/28/2020. This
over-read does not include interpretation of cardiac or coronary
anatomy or pathology. The coronary calcium score interpretation by
the cardiologist is attached.
CLINICAL DATA: Risk stratification
Coronary Calcium Score
TECHNIQUE: The patient was scanned on a Siemens Force scanner. Axial
non-contrast 3 mm slices were carried out through the heart. The
data set was analyzed on a dedicated work station and scored using
the Agatson method.

[Series 3: casc 3.0 bv41 2 bestdiast 71 % · axial · 0.37mm/px · z∈[-127,-55]mm · 4 of 42 slices shown, 5 images]
[im 9/42  vessel]
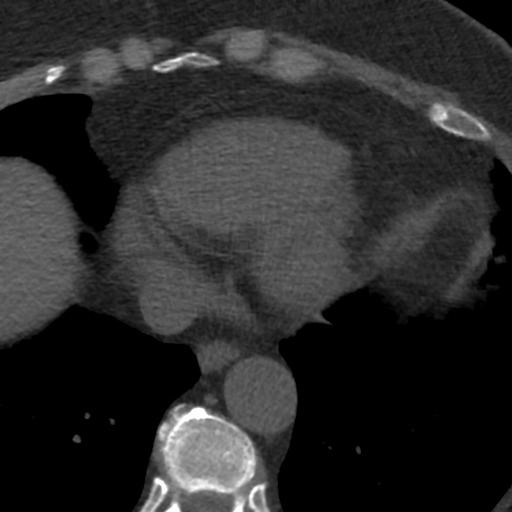
[im 9/42  lung]
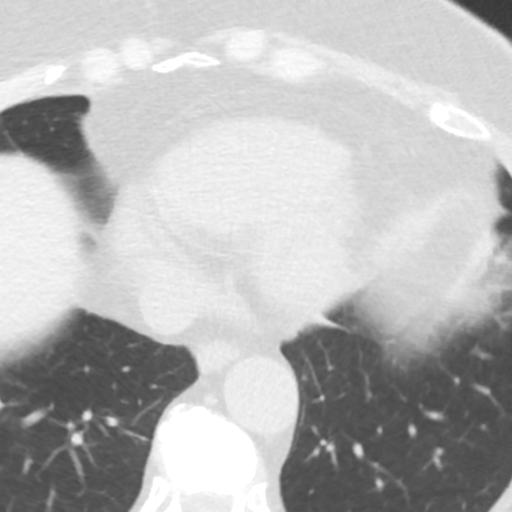
[im 17/42  vessel]
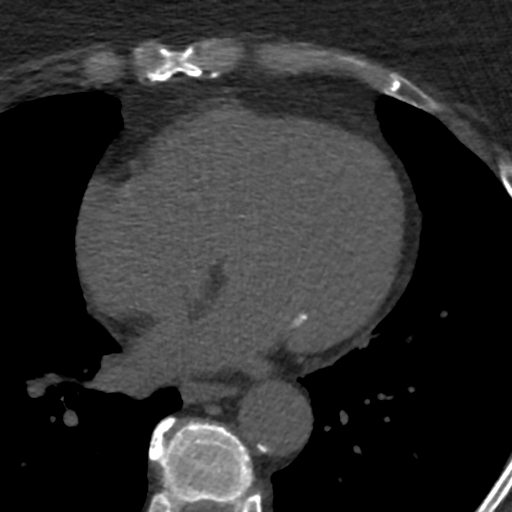
[im 25/42  vessel]
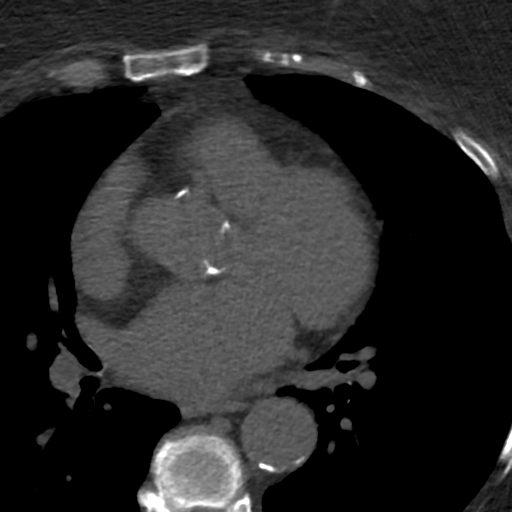
[im 33/42  vessel]
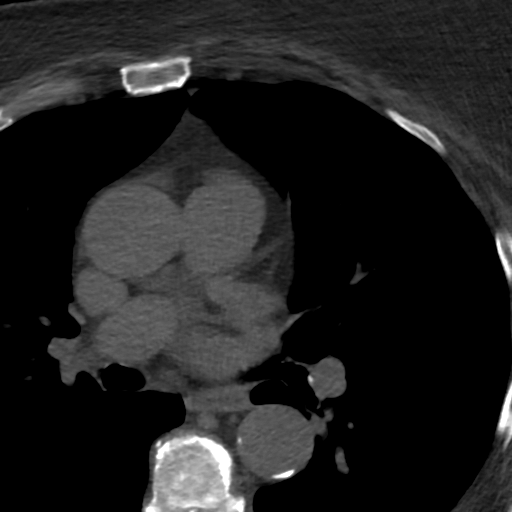

[Series 4: lung 71 % · axial · 0.61mm/px · z∈[-133,-49]mm · 5 of 42 slices shown]
[im 7/42  lung]
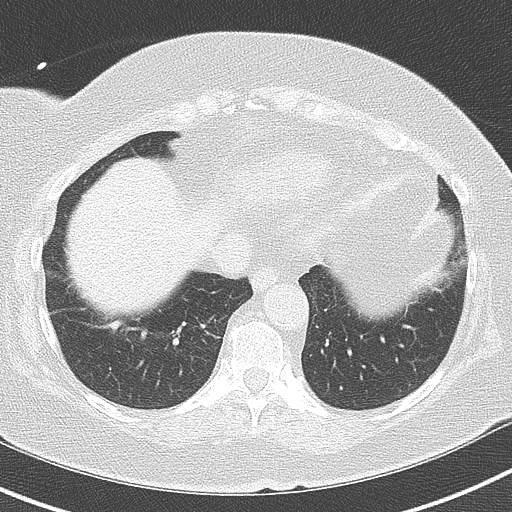
[im 14/42  lung]
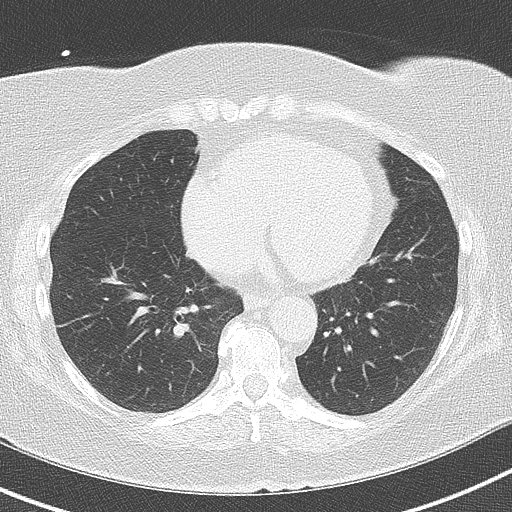
[im 21/42  lung]
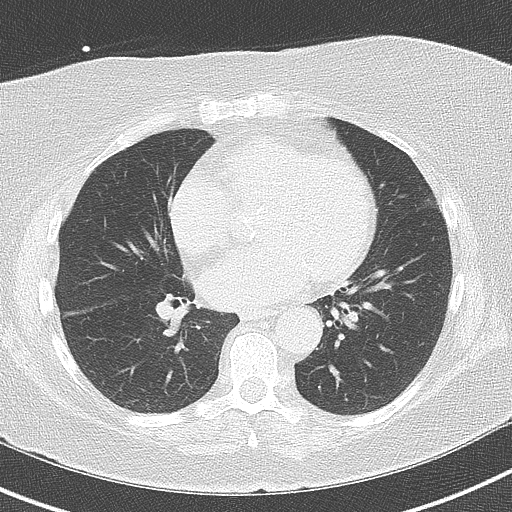
[im 28/42  lung]
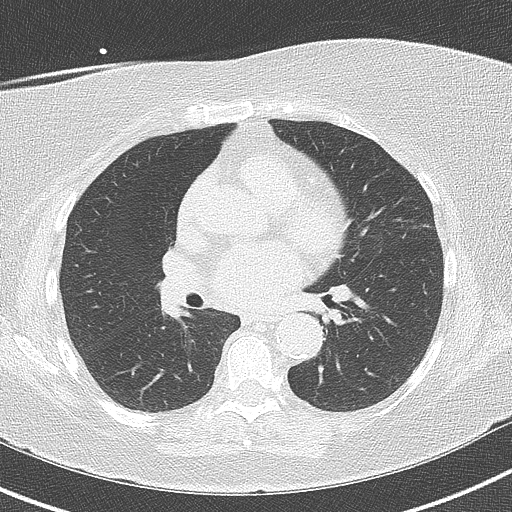
[im 35/42  lung]
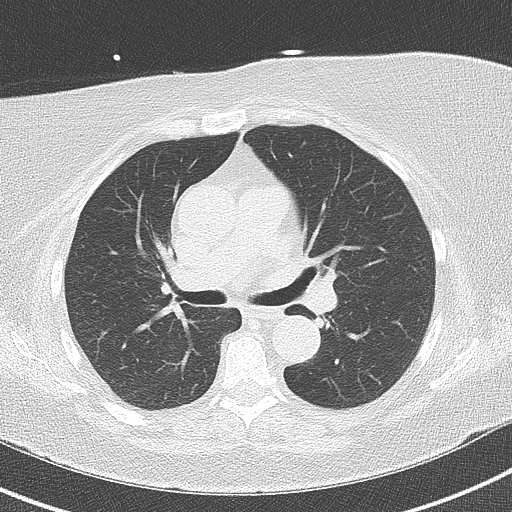

[Series 5: lung st 71 % · axial · 0.61mm/px · z∈[-133,-49]mm · 5 of 42 slices shown]
[im 7/42  lung]
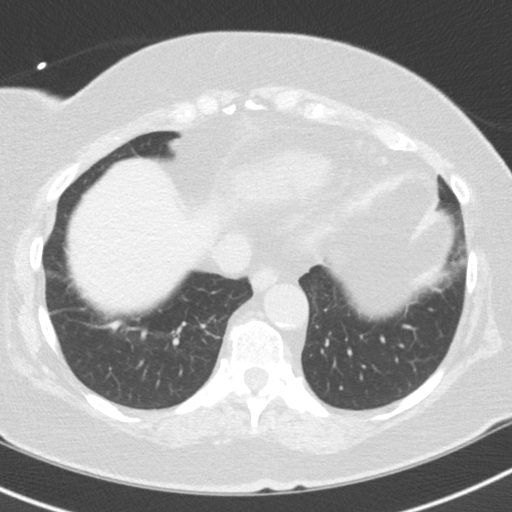
[im 14/42  lung]
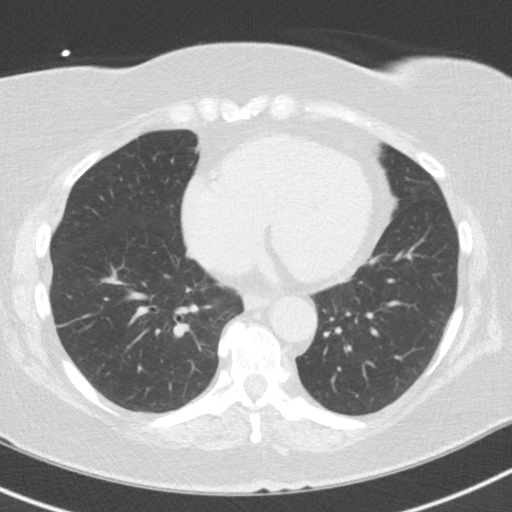
[im 21/42  lung]
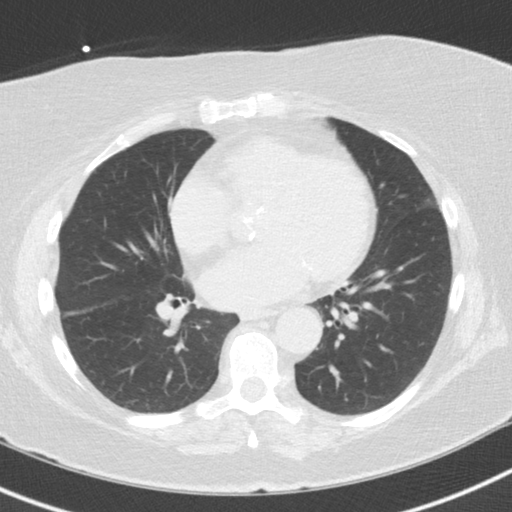
[im 28/42  lung]
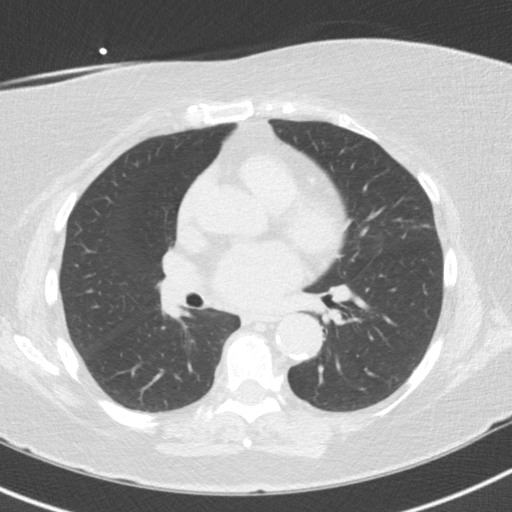
[im 35/42  lung]
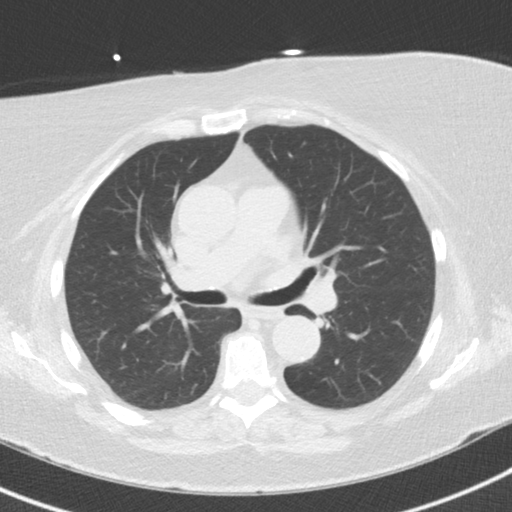

[14 of 20 positions shown; findings below may reference images not displayed]

FINDINGS: Aortic atherosclerosis. Within the visualized portions of the thorax
there are no suspicious appearing pulmonary nodules or masses, there
is no acute consolidative airspace disease, no pleural effusions, no
pneumothorax and no lymphadenopathy. Visualized portions of the
upper abdomen are unremarkable. There are no aggressive appearing
lytic or blastic lesions noted in the visualized portions of the
skeleton.
IMPRESSION: 1.  Aortic Atherosclerosis (YZIFL-W1I.I).
FINDINGS: Non-cardiac: See separate report from [REDACTED].

Ascending Aorta: Normal caliber. Scattered calcifications in the
ascending and descending aorta.

Aortic Valve: Scattered calcifications.

Mitral Valve: Mitral annular calcifications of the posterior mitral
valve annulus.

Pericardium: Normal

Coronary arteries: Normal coronary origins.
IMPRESSION: Coronary calcium score of 235. This was percentile for age and sex
matched control.

Rambriksh Tiger

*** End of Addendum ***
EXAM:
OVER-READ INTERPRETATION  CT CHEST

The following report is an over-read performed by radiologist Dr.
Gabbi Nair [REDACTED] on 01/28/2020. This
over-read does not include interpretation of cardiac or coronary
anatomy or pathology. The coronary calcium score interpretation by
the cardiologist is attached.
FINDINGS: Aortic atherosclerosis. Within the visualized portions of the thorax
there are no suspicious appearing pulmonary nodules or masses, there
is no acute consolidative airspace disease, no pleural effusions, no
pneumothorax and no lymphadenopathy. Visualized portions of the
upper abdomen are unremarkable. There are no aggressive appearing
lytic or blastic lesions noted in the visualized portions of the
skeleton.
IMPRESSION: 1.  Aortic Atherosclerosis (YZIFL-W1I.I).

## 2022-01-20 ENCOUNTER — Encounter: Payer: Medicare HMO | Admitting: Internal Medicine

## 2022-01-23 ENCOUNTER — Other Ambulatory Visit: Payer: Self-pay | Admitting: Internal Medicine

## 2022-01-26 DIAGNOSIS — E119 Type 2 diabetes mellitus without complications: Secondary | ICD-10-CM | POA: Diagnosis not present

## 2022-01-26 LAB — LIPID PANEL
Cholesterol: 191 (ref 0–200)
HDL: 77 — AB (ref 35–70)
LDL Cholesterol: 89
LDl/HDL Ratio: 2.5
Triglycerides: 124 (ref 40–160)

## 2022-01-26 LAB — HEPATIC FUNCTION PANEL
ALT: 13 U/L (ref 7–35)
AST: 16 (ref 13–35)
Alkaline Phosphatase: 93 (ref 25–125)
Bilirubin, Total: 0.3

## 2022-01-26 LAB — BASIC METABOLIC PANEL
BUN: 18 (ref 4–21)
CO2: 23 — AB (ref 13–22)
Chloride: 96 — AB (ref 99–108)
Creatinine: 0.9 (ref 0.5–1.1)
Glucose: 94
Potassium: 4.7 mEq/L (ref 3.5–5.1)
Sodium: 133 — AB (ref 137–147)

## 2022-01-26 LAB — CBC: RBC: 4.23 (ref 3.87–5.11)

## 2022-01-26 LAB — CBC AND DIFFERENTIAL
HCT: 40 (ref 36–46)
Hemoglobin: 13.6 (ref 12.0–16.0)
Platelets: 296 10*3/uL (ref 150–400)
WBC: 7.2

## 2022-01-26 LAB — COMPREHENSIVE METABOLIC PANEL
Albumin: 4.5 (ref 3.5–5.0)
Calcium: 9.3 (ref 8.7–10.7)
Globulin: 1.7
eGFR: 62

## 2022-01-26 LAB — TSH: TSH: 1.15 (ref 0.41–5.90)

## 2022-01-27 ENCOUNTER — Encounter: Payer: Self-pay | Admitting: Internal Medicine

## 2022-01-27 ENCOUNTER — Non-Acute Institutional Stay: Payer: Medicare HMO | Admitting: Internal Medicine

## 2022-01-27 VITALS — BP 136/92 | HR 92 | Temp 97.8°F | Ht 64.0 in | Wt 172.8 lb

## 2022-01-27 DIAGNOSIS — I10 Essential (primary) hypertension: Secondary | ICD-10-CM

## 2022-01-27 DIAGNOSIS — K580 Irritable bowel syndrome with diarrhea: Secondary | ICD-10-CM

## 2022-01-27 DIAGNOSIS — K5901 Slow transit constipation: Secondary | ICD-10-CM | POA: Diagnosis not present

## 2022-01-27 DIAGNOSIS — Z9889 Other specified postprocedural states: Secondary | ICD-10-CM

## 2022-01-27 DIAGNOSIS — E7800 Pure hypercholesterolemia, unspecified: Secondary | ICD-10-CM | POA: Diagnosis not present

## 2022-01-27 DIAGNOSIS — F339 Major depressive disorder, recurrent, unspecified: Secondary | ICD-10-CM

## 2022-01-27 DIAGNOSIS — E039 Hypothyroidism, unspecified: Secondary | ICD-10-CM

## 2022-01-27 DIAGNOSIS — G4733 Obstructive sleep apnea (adult) (pediatric): Secondary | ICD-10-CM | POA: Diagnosis not present

## 2022-01-27 DIAGNOSIS — R69 Illness, unspecified: Secondary | ICD-10-CM | POA: Diagnosis not present

## 2022-01-27 NOTE — Progress Notes (Signed)
Location:  [place] of Service:  Clinic (12)  Provider:   Code Status:  Goals of Care:     10/12/2021    2:48 PM  Advanced Directives  Does Patient Have a Medical Advance Directive? Yes  Type of Paramedic of [place];Living will  Does patient want to make changes to medical advance directive? No - Patient declined  Copy of [place] in Chart? Yes - validated most recent copy scanned in chart (See row information)     Chief Complaint  Patient presents with   Medical Management of Chronic Issues    Medical Management of Chronic Issues. 3 Month Follow up    HPI: Patient is a 87 y.o. female seen today for medical management of chronic diseases.    Lives in [place] with her granddaughter  Patient used to live by herself when she had a fall in 04/2021 She sustained a closed compression fracture and underwent elective kyphoplasty of T12 and L1 in 01/23 After that she moved to [place] in [place] She continues to do well. Also has a history of hypertension, arthritis, depression, TSH, GERD, HLD, IBS with diarrhea  She wanted to know if she can see the therapist for her depression in [place] MMSE was 30 out of 30 and she self administers her medication.  Is able to take herself off the hydrocodone but still takes Celebrex twice a day Walking well with her walker.  No falls Sleep Apnea Patient has a history of sleep apnea was on CPAP but when she moved to [place] she has not been using it.  Patient states that she gets up every 2-3 hours at night and takes naps during the daytime.  Past Medical History:  Diagnosis Date   Allergy    Anemia    Anginal pain ([place])    Anxiety    Arthritis    Atypical moles    Breast lump    left   Chronic kidney disease    Colon polyp    Depression    Diverticulitis    04-15-15 no recent issues   Dysrhythmia 01/2020   Edema    legs, hands  bilateral    GERD (gastroesophageal reflux disease)    hx of    Heart murmur 01/2020   High cholesterol    Per new patient form   Hypertension    Hypothyroidism    IBS (irritable bowel syndrome)    Per new patient form   Impaired hearing    bilaterally some hearing loss-no hearing aids   Lactose intolerance    Per new patient form   Macular degeneration    Per new patient form   Mild cognitive impairment with memory loss 08/02/2000   per Dr Berdine Addison, neuro, 04-15-15 no changes-"stable"   Recurrent upper respiratory infection (URI)    cold with sinus drainage x several months- saw PCP 10/08/11- chest x ray report on chart.  no fever,resolved, no recent issues as of 04-15-15.   Shoulder joint pain    04-07-15 "tripped-loss balance, fell awkwardly and "wrenched shoulder-left", Known "bad right shoulder"bone on bone"   Skin lesion of breast    hx of right lateral breast  lesion-tx. with multipe rounds oral antibiotic- resolved, no reoccurrence.   Sleep apnea    Thyroid disease    hypothyroidism    Past Surgical History:  Procedure Laterality Date   BREAST SURGERY     lumpectomy- left-benign  CATARACT EXTRACTION, BILATERAL Bilateral    CHOLECYSTECTOMY     laparoscopic   COLECTOMY  10/20/2011   sigmoid colectomy   COLONOSCOPY  2018   DG  BONE DENSITY ([place] HX)  2021   [name], PA; [facility]/ Per new patient form   ESOPHAGOGASTRODUODENOSCOPY     EYE SURGERY     bilateral cataracts   HERNIA REPAIR     INCISIONAL HERNIA REPAIR N/A 01/12/2013   Procedure: LAPAROSCOPIC/OPEN REPAIR INCISIONAL HERNIA;  Surgeon: [name], MD;  Location: [facility];  Service: General;  Laterality: N/A;  With Mesh   INCISIONAL HERNIA REPAIR N/A 04/23/2015   Procedure: LAPAROSCOPIC ASSISTED INCISIONAL VENTRAL HERNIA REPAIR;  Surgeon: [name], MD;  [facility]: [facility];  Service: General;  Laterality: N/A;   INSERTION OF MESH N/A 04/23/2015   Procedure: INSERTION OF MESH;  Surgeon: [name], MD;   [facility];  Service: General;  Laterality: N/A;   JOINT REPLACEMENT  05/2021   left hip   KYPHOPLASTY N/A 08/19/2021   Procedure: KYPHOPLASTY THORACIC TWELVE, LUMBAR ONE;  Surgeon: [name], MD;  Location: [facility];  Service: Neurosurgery;  Laterality: N/A;  KYPHOPLASTY THORACIC TWELVE, LUMBAR ONE   TOTAL HIP ARTHROPLASTY Left 2022   [facility] [place]; Per new patient form   TUBAL LIGATION  1972    Allergies  Allergen Reactions   Penicillins Anaphylaxis   Codeine     Makes pt overly excited.... Anxious/ hyperactive    Nsaids     Burns stomach     Outpatient Encounter Medications as of 01/27/2022  Medication Sig   amLODipine (NORVASC) 5 MG tablet Take 5 mg by mouth daily.   Calcium Carb-Cholecalciferol (CALCIUM 600 + D PO) Take 1 tablet by mouth daily.   carboxymethylcellul-glycerin (LUBRICATING EYE DROPS) 0.5-0.9 % ophthalmic solution Place 1 drop into both eyes daily as needed (dry eyes).   celecoxib (CELEBREX) 100 MG capsule Take 100 mg by mouth 2 (two) times daily as needed for moderate pain.   clonazePAM (KLONOPIN) 0.5 MG tablet Take 0.5 mg by mouth 2 (two) times daily as needed for anxiety.   cyanocobalamin 100 MCG tablet Take 100 mcg by mouth daily.   DULoxetine (CYMBALTA) 60 MG capsule Take 60 mg by mouth daily.   hyoscyamine (LEVSIN SL) 0.125 MG SL tablet PLACE 1 TABLET (0.125 MG TOTAL) UNDER THE TONGUE 4 (FOUR) TIMES DAILY AS NEEDED FOR CRAMPING.   levothyroxine (SYNTHROID) 50 MCG tablet Take 1 tablet (50 mcg total) by mouth daily before breakfast.   loratadine (CLARITIN) 10 MG tablet Take 10 mg by mouth daily.   Multiple Vitamins-Minerals (PRESERVISION AREDS 2+MULTI VIT PO) Take 1 capsule by mouth daily.   omeprazole (PRILOSEC) 20 MG capsule Take 20 mg by mouth daily.   oxyCODONE-acetaminophen (PERCOCET/ROXICET) 5-325 MG tablet 1-2 Tablets by mouth every 4 hours as needed.   polyethylene glycol (MIRALAX / GLYCOLAX) 17 g packet Take 17 g by  mouth daily.   rosuvastatin (CRESTOR) 5 MG tablet Take 1 tablet (5 mg total) by mouth daily.   triamterene-hydrochlorothiazide (DYAZIDE) 37.5-25 MG per capsule Take 1 capsule by mouth every morning.    [DISCONTINUED] Cetirizine HCl (ZYRTEC PO) Take by mouth daily.   [DISCONTINUED] Cholecalciferol (VITAMIN D3 PO) Take 30,000 Units by mouth once a week.   [DISCONTINUED] clonazePAM (KLONOPIN) 0.5 MG tablet Take 1 tablet (0.5 mg total) by mouth 2 (two) times daily as needed for anxiety.   [DISCONTINUED] Cyanocobalamin (VITAMIN B 12) 500 MCG  TABS Take 100 mcg by mouth daily.   [DISCONTINUED] Probiotic Product (PROBIOTIC DAILY PO) Take by mouth daily.   [DISCONTINUED] traMADol (ULTRAM) 50 MG tablet Take 50 mg by mouth every 4 (four) hours as needed.   No facility-administered encounter medications on file as of 01/27/2022.    Review of Systems:  Review of Systems  Constitutional:  Negative for activity change and appetite change.  HENT: Negative.    Respiratory:  Negative for cough and shortness of breath.   Cardiovascular:  Negative for leg swelling.  Gastrointestinal:  Negative for constipation.  Genitourinary:  Positive for frequency.  Musculoskeletal:  Positive for back pain, gait problem and myalgias. Negative for arthralgias.  Skin: Negative.   Neurological:  Negative for dizziness and weakness.  Psychiatric/Behavioral:  Positive for sleep disturbance. Negative for confusion and dysphoric mood.     Health Maintenance  Topic Date Due   TETANUS/TDAP  Never done   DEXA SCAN  Never done   INFLUENZA VACCINE  03/02/2022   COVID-19 Vaccine (2 - Moderna series) 04/19/2022   Pneumonia Vaccine 61+ Years old  Completed   Zoster Vaccines- Shingrix  Completed   HPV VACCINES  Aged Out    Physical Exam: Vitals:   01/27/22 1045  BP: (!) 136/92  Pulse: 92  Temp: 97.8 F (36.6 C)  TempSrc: Skin  SpO2: 96%  Weight: 172 lb 12.8 oz (78.4 kg)  Height: '5\' 4"'$  (1.626 m)   Body mass index is  29.66 kg/m. Physical Exam Vitals reviewed.  Constitutional:      Appearance: Normal appearance.  HENT:     Head: Normocephalic.     Right Ear: Tympanic membrane normal.     Left Ear: Tympanic membrane normal.     Nose: Nose normal.     Mouth/Throat:     Mouth: Mucous membranes are moist.     Pharynx: Oropharynx is clear.  Eyes:     Pupils: Pupils are equal, round, and reactive to light.  Cardiovascular:     Rate and Rhythm: Normal rate and regular rhythm.     Pulses: Normal pulses.     Heart sounds: Normal heart sounds. No murmur heard. Pulmonary:     Effort: Pulmonary effort is normal.     Breath sounds: Normal breath sounds.  Abdominal:     General: Abdomen is flat. Bowel sounds are normal.     Palpations: Abdomen is soft.  Musculoskeletal:        General: No swelling.     Cervical back: Neck supple.  Skin:    General: Skin is warm.  Neurological:     General: No focal deficit present.     Mental Status: She is alert and oriented to person, place, and time.  Psychiatric:        Mood and Affect: Mood normal.        Thought Content: Thought content normal.     Labs reviewed: Basic Metabolic Panel: Recent Labs    01/26/22 0000  NA 133*  K 4.7  CL 96*  CO2 23*  BUN 18  CREATININE 0.9  CALCIUM 9.3  TSH 1.15   Liver Function Tests: Recent Labs    01/26/22 0000  AST 16  ALT 13  ALKPHOS 93  ALBUMIN 4.5   No results for input(s): "LIPASE", "AMYLASE" in the last 8760 hours. No results for input(s): "AMMONIA" in the last 8760 hours. CBC: Recent Labs    01/26/22 0000  WBC 7.2  HGB 13.6  HCT 40  PLT 296   Lipid Panel: Recent Labs    01/26/22 0000  CHOL 191  HDL 77*  LDLCALC 89  TRIG 124   No results found for: "HGBA1C"  Procedures since last visit: No results found.  Assessment/Plan 1. Essential hypertension On Norvasc and Dyazide Most of the reading good in [place]  2. Irritable bowel syndrome with diarrhea Change her Miralax to PRN Also  on Levsin Prn Rarely uses it  3. S/P kyphoplasty Continues to have Pain Walks with her walker Will try to taper herself off Celebrex   4. Pure hypercholesterolemia On statin  5. Hypothyroidism, unspecified type TSH normal  6. Depression, recurrent ([place]) Cymbalta Referal to [place] therapist Uses Klonopin Rarely  7 Obstructive sleep apnea syndrome Nurses to help with her CPAP mask  If it does not work  will refer to Sleep Clinic  TDAp ordered   Labs/tests ordered:  * No order type specified * Next appt:  05/31/2022

## 2022-02-04 DIAGNOSIS — M47816 Spondylosis without myelopathy or radiculopathy, lumbar region: Secondary | ICD-10-CM | POA: Diagnosis not present

## 2022-02-16 ENCOUNTER — Other Ambulatory Visit: Payer: Self-pay | Admitting: Orthopedic Surgery

## 2022-02-16 DIAGNOSIS — E039 Hypothyroidism, unspecified: Secondary | ICD-10-CM

## 2022-02-16 DIAGNOSIS — I10 Essential (primary) hypertension: Secondary | ICD-10-CM

## 2022-02-16 DIAGNOSIS — E7800 Pure hypercholesterolemia, unspecified: Secondary | ICD-10-CM

## 2022-02-16 DIAGNOSIS — S32010D Wedge compression fracture of first lumbar vertebra, subsequent encounter for fracture with routine healing: Secondary | ICD-10-CM

## 2022-02-16 DIAGNOSIS — F339 Major depressive disorder, recurrent, unspecified: Secondary | ICD-10-CM

## 2022-02-16 DIAGNOSIS — K219 Gastro-esophageal reflux disease without esophagitis: Secondary | ICD-10-CM

## 2022-02-16 MED ORDER — TRIAMTERENE-HCTZ 37.5-25 MG PO CAPS: 1.0000 | ORAL_CAPSULE | Freq: Every morning | ORAL | 1 refills | Status: DC

## 2022-02-16 MED ORDER — LEVOTHYROXINE SODIUM 50 MCG PO TABS: 50.0000 ug | ORAL_TABLET | Freq: Every day | ORAL | 1 refills | Status: DC

## 2022-02-16 MED ORDER — CELECOXIB 100 MG PO CAPS: 100.0000 mg | ORAL_CAPSULE | Freq: Two times a day (BID) | ORAL | 1 refills | Status: DC | PRN

## 2022-02-16 MED ORDER — OMEPRAZOLE 20 MG PO CPDR: 20.0000 mg | DELAYED_RELEASE_CAPSULE | Freq: Every day | ORAL | 1 refills | Status: DC

## 2022-02-16 MED ORDER — DULOXETINE HCL 60 MG PO CPEP: 60.0000 mg | ORAL_CAPSULE | Freq: Every day | ORAL | 1 refills | Status: DC

## 2022-02-16 MED ORDER — AMLODIPINE BESYLATE 5 MG PO TABS: 5.0000 mg | ORAL_TABLET | Freq: Every day | ORAL | 1 refills | Status: DC

## 2022-02-16 MED ORDER — ROSUVASTATIN CALCIUM 5 MG PO TABS: 5.0000 mg | ORAL_TABLET | Freq: Every day | ORAL | 1 refills | Status: DC

## 2022-04-13 ENCOUNTER — Non-Acute Institutional Stay: Payer: Medicare HMO | Admitting: Internal Medicine

## 2022-04-13 ENCOUNTER — Encounter: Payer: Self-pay | Admitting: Internal Medicine

## 2022-04-13 DIAGNOSIS — I10 Essential (primary) hypertension: Secondary | ICD-10-CM | POA: Diagnosis not present

## 2022-04-13 DIAGNOSIS — R202 Paresthesia of skin: Secondary | ICD-10-CM

## 2022-04-13 DIAGNOSIS — R200 Anesthesia of skin: Secondary | ICD-10-CM | POA: Diagnosis not present

## 2022-04-13 DIAGNOSIS — R69 Illness, unspecified: Secondary | ICD-10-CM | POA: Diagnosis not present

## 2022-04-13 DIAGNOSIS — F419 Anxiety disorder, unspecified: Secondary | ICD-10-CM

## 2022-04-13 DIAGNOSIS — R42 Dizziness and giddiness: Secondary | ICD-10-CM

## 2022-04-13 MED ORDER — MECLIZINE HCL 12.5 MG PO TABS: 12.5000 mg | ORAL_TABLET | Freq: Two times a day (BID) | ORAL | 0 refills | Status: DC

## 2022-04-13 NOTE — Progress Notes (Signed)
Location:   [facility] Room Number: 712/W Place of Service:  ALF [phone]) Provider:  Virgie Dad, MD  Patient Care Team: Virgie Dad, MD as PCP - General (Internal Medicine) [name], MD (Inactive) as Consulting Physician (General Surgery) Virgie Dad, MD as Consulting Physician (Internal Medicine) Brandy Hale, MD as Referring Physician (Obstetrics and Gynecology) Donato Heinz, MD as Consulting Physician (Cardiology)  Extended Emergency Contact Information Primary Emergency Contact: East,Teresa Address: [address]          [place], [place] [address] [place] Phone: [phone] Mobile Phone: [phone] Relation: Daughter Secondary Emergency Contact: Shane Crutch Address: [address]          [place], [place] [address] [place] of [place] Phone: [phone] Mobile Phone: [phone] Relation: Daughter  Code Status:  Full Code Goals of care: Advanced Directive information    04/13/2022   11:02 AM  Advanced Directives  Does Patient Have a Medical Advance Directive? Yes  Type of Advance Directive [place]  Does patient want to make changes to medical advance directive? No - Patient declined  Copy of [place] in Chart? Yes - validated most recent copy scanned in chart (See row information)     Chief Complaint  Patient presents with   Acute Visit    For a fall.    HPI:  Pt is a 87 y.o. female seen today for an acute visit for Fall and Dizziness  [place]ives in [place]  history of hypertension, arthritis, depression, TSH, GERD, HLD, IBS with diarrhea And Anxiety and Sleep apnea H/o Fall in 09/22 sustaining closed compression fracture and underwent elective kyphoplasty of T12 and L1 in 01/23  Last night she was feeling some dizziness when she went to bed.   She got up in the middle of the night to go to the bathroom when she suddenly  fell.  She was able to call for help.  She told them that her head was swimming.   Her blood pressure was 174/102.   Patient decided to take Klonopin and her blood pressure came down to 155/86. This morning she feels weak but is able to stand up but still feels like her head is dizzy.  Denied any nausea or vomiting did have some runny nose and is COVID-negative  Patient also had an issue with her left hand her hand feels numb it comes and goes but mostly when she sleeps on that side and gets up in the morning and she feels tingling and numbness in her fingers.  She has also now started noticing some weakness in her hand. She continues to have back pain and follows with Ortho   Past Medical History:  Diagnosis Date   Allergy    Anemia    Anginal pain ([place])    Anxiety    Arthritis    Atypical moles    Breast lump    left   Chronic kidney disease    Colon polyp    Depression    Diverticulitis    04-15-15 no recent issues   Dysrhythmia 01/2020   Edema    legs, hands  bilateral   GERD (gastroesophageal reflux disease)    hx of    Heart murmur 01/2020   High cholesterol    Per new patient form   Hypertension    Hypothyroidism    IBS (irritable bowel syndrome)    Per new patient  form   Impaired hearing    bilaterally some hearing loss-no hearing aids   Lactose intolerance    Per new patient form   Macular degeneration    Per new patient form   Mild cognitive impairment with memory loss 08/02/2000   per Dr Berdine Addison, neuro, 04-15-15 no changes-"stable"   Recurrent upper respiratory infection (URI)    cold with sinus drainage x several months- saw PCP 10/08/11- chest x ray report on chart.  no fever,resolved, no recent issues as of 04-15-15.   Shoulder joint pain    04-07-15 "tripped-loss balance, fell awkwardly and "wrenched shoulder-left", Known "bad right shoulder"bone on bone"   Skin lesion of breast    hx of right lateral breast  lesion-tx. with multipe rounds oral antibiotic-  resolved, no reoccurrence.   Sleep apnea    Thyroid disease    hypothyroidism   Past Surgical History:  Procedure Laterality Date   BREAST SURGERY     lumpectomy- left-benign   CATARACT EXTRACTION, BILATERAL Bilateral    CHOLECYSTECTOMY     laparoscopic   COLECTOMY  10/20/2011   sigmoid colectomy   COLONOSCOPY  2018   DG  BONE DENSITY ([place] HX)  2021   [name], PA; [facility]/ Per new patient form   ESOPHAGOGASTRODUODENOSCOPY     EYE SURGERY     bilateral cataracts   HERNIA REPAIR     INCISIONAL HERNIA REPAIR N/A 01/12/2013   Procedure: LAPAROSCOPIC/OPEN REPAIR INCISIONAL HERNIA;  Surgeon: [name], MD;  [facility];  Service: General;  Laterality: N/A;  With Mesh   INCISIONAL HERNIA REPAIR N/A 04/23/2015   Procedure: LAPAROSCOPIC ASSISTED INCISIONAL VENTRAL HERNIA REPAIR;  Surgeon: [name], MD;  [facility];  Service: General;  Laterality: N/A;   INSERTION OF MESH N/A 04/23/2015   Procedure: INSERTION OF MESH;  Surgeon: [name], MD;  [facility];  Service: General;  Laterality: N/A;   JOINT REPLACEMENT  05/2021   left hip   KYPHOPLASTY N/A 08/19/2021   Procedure: KYPHOPLASTY THORACIC TWELVE, LUMBAR ONE;  Surgeon: [name], MD;  Location: [facility];  Service: Neurosurgery;  Laterality: N/A;  KYPHOPLASTY THORACIC TWELVE, LUMBAR ONE   TOTAL HIP ARTHROPLASTY Left 2022   [facility] [place]; Per new patient form   TUBAL LIGATION  1972    Allergies  Allergen Reactions   Penicillins Anaphylaxis   Codeine     Makes pt overly excited.... Anxious/ hyperactive    Nsaids     Burns stomach     Allergies as of 04/13/2022       Reactions   Penicillins Anaphylaxis   Codeine    Makes pt overly excited.... Anxious/ hyperactive   Nsaids    Burns stomach         Medication List        Accurate as of April 13, 2022 11:09 AM. If you have any questions, ask your nurse or doctor.           amLODipine 5 MG tablet Commonly known as: NORVASC Take 1 tablet (5 mg total) by mouth daily.   CALCIUM 600 + D PO Take 1 tablet by mouth daily.   celecoxib 100 MG capsule Commonly known as: CELEBREX Take 1 capsule (100 mg total) by mouth 2 (two) times daily as needed for moderate pain.   clonazePAM 0.5 MG tablet Commonly known as: KLONOPIN Take 0.5 mg by mouth 2 (two) times daily as needed for anxiety.   cyanocobalamin 100  MCG tablet Take 100 mcg by mouth daily.   DULoxetine 60 MG capsule Commonly known as: CYMBALTA Take 1 capsule (60 mg total) by mouth daily.   hyoscyamine 0.125 MG SL tablet Commonly known as: LEVSIN SL PLACE 1 TABLET (0.125 MG TOTAL) UNDER THE TONGUE 4 (FOUR) TIMES DAILY AS NEEDED FOR CRAMPING.   levothyroxine 50 MCG tablet Commonly known as: SYNTHROID Take 1 tablet (50 mcg total) by mouth daily before breakfast.   loratadine 10 MG tablet Commonly known as: CLARITIN Take 10 mg by mouth daily.   Lubricating Eye Drops 0.5-0.9 % ophthalmic solution Generic drug: carboxymethylcellul-glycerin Place 1 drop into both eyes daily as needed (dry eyes).   omeprazole 20 MG capsule Commonly known as: PRILOSEC Take 1 capsule (20 mg total) by mouth daily.   polyethylene glycol 17 g packet Commonly known as: MIRALAX / GLYCOLAX Take 17 g by mouth daily as needed.   PRESERVISION AREDS 2+MULTI VIT PO Take 1 capsule by mouth daily.   rosuvastatin 5 MG tablet Commonly known as: CRESTOR Take 1 tablet (5 mg total) by mouth daily.   triamterene-hydrochlorothiazide 37.5-25 MG capsule Commonly known as: DYAZIDE Take 1 each (1 capsule total) by mouth every morning.        Review of Systems  Constitutional:  Negative for activity change and appetite change.  HENT: Negative.    Respiratory:  Negative for cough and shortness of breath.   Cardiovascular:  Negative for leg swelling.  Gastrointestinal:  Negative for constipation.  Genitourinary: Negative.    Musculoskeletal:  Positive for back pain. Negative for arthralgias, gait problem and myalgias.  Skin: Negative.   Neurological:  Positive for dizziness. Negative for weakness.  Psychiatric/Behavioral:  Negative for confusion, dysphoric mood and sleep disturbance.     Immunization History  Administered Date(s) Administered   Influenza, High Dose Seasonal PF 04/19/2019, 04/18/2020, 06/17/2021   Moderna Covid-19 Vaccine Bivalent Booster 37yr & up 08/16/2019, 09/12/2019, 06/29/2021   Moderna Sars-Covid-2 Vaccination 06/17/2020, 03/09/2021, 08/06/2021, 12/17/2021   Pneumococcal Conjugate-13 12/20/2013   Pneumococcal Polysaccharide-23 08/17/2012   Zoster Recombinat (Shingrix) 01/30/2014, 12/18/2020, 04/18/2021   Pertinent  Health Maintenance Due  Topic Date Due   DEXA SCAN  Never done   INFLUENZA VACCINE  03/02/2022      08/19/2021    7:14 AM 10/12/2021    2:47 PM  [place] the past [place]?  1  Was there an injury with Fall?  0  Fall Risk Category Calculator  2  Fall Risk Category  Moderate  Patient Fall Risk Level High fall risk Moderate fall risk  Patient at Risk for Falls Due to  No Fall Risks  Fall risk Follow up  Falls evaluation completed   Functional Status Survey:    Vitals:   04/13/22 1056  BP: (!) 142/85  Pulse: 88  Resp: 20  Temp: 97.7 F (36.5 C)  SpO2: 97%  Weight: 173 lb 9.6 oz (78.7 kg)  Height: '5\' 4"'$  (1.626 m)   Body mass index is 29.8 kg/m. Physical Exam Vitals reviewed.  Constitutional:      Appearance: Normal appearance.  HENT:     Head: Normocephalic.     Nose: Nose normal.     Mouth/Throat:     Mouth: Mucous membranes are moist.     Pharynx: Oropharynx is clear.  Eyes:     Pupils: Pupils are equal, round, and reactive to light.  Cardiovascular:     Rate and Rhythm: Normal rate and regular rhythm.  Pulses: Normal pulses.     Heart sounds: Normal heart sounds. No murmur heard. Pulmonary:     Effort: Pulmonary effort is  normal.     Breath sounds: Normal breath sounds.  Abdominal:     General: Abdomen is flat. Bowel sounds are normal.     Palpations: Abdomen is soft.  Musculoskeletal:        General: No swelling.     Cervical back: Neck supple.     Comments: Feels Numbness in her Left hand Has Arthritis  [place] Muscle loss and weakness in that hand  Skin:    General: Skin is warm.  Neurological:     General: No focal deficit present.     Mental Status: She is alert and oriented to person, place, and time.     Comments: Dizziness with Position change  Psychiatric:        Mood and Affect: Mood normal.        Thought Content: Thought content normal.     Labs reviewed: Recent Labs    01/26/22 0000  NA 133*  K 4.7  CL 96*  CO2 23*  BUN 18  CREATININE 0.9  CALCIUM 9.3   Recent Labs    01/26/22 0000  AST 16  ALT 13  ALKPHOS 93  ALBUMIN 4.5   Recent Labs    01/26/22 0000  WBC 7.2  HGB 13.6  HCT 40  PLT 296   Lab Results  Component Value Date   TSH 1.15 01/26/2022   No results found for: "HGBA1C" Lab Results  Component Value Date   CHOL 191 01/26/2022   HDL 77 (A) 01/26/2022   LDLCALC 89 01/26/2022   TRIG 124 01/26/2022    Significant Diagnostic Results in last 30 days:  No results found.  Assessment/Plan 1. Vertigo Possible cause of  her dizziness Will Try Meclizine 12.5 mg BID  2. Essential hypertension On Norvasc and Dyazide Check BP BID for 7 days and bring it in next appointment  3. Anxiety Can take Klonopin prn  4. Numbness and tingling in left hand Carpel Tunnel - Ambulatory referral to Orthopedic Surgery .   Family/ staff Communication:   Labs/tests ordered:   IRC,VEL

## 2022-04-14 DIAGNOSIS — E039 Hypothyroidism, unspecified: Secondary | ICD-10-CM | POA: Diagnosis not present

## 2022-04-14 LAB — CBC AND DIFFERENTIAL
HCT: 37 (ref 36–46)
Hemoglobin: 12.8 (ref 12.0–16.0)
Platelets: 283 10*3/uL (ref 150–400)
WBC: 7.1

## 2022-04-14 LAB — BASIC METABOLIC PANEL
CO2: 26 — AB (ref 13–22)
Chloride: 97 — AB (ref 99–108)
Creatinine: 1 (ref 0.5–1.1)
Glucose: 96
Potassium: 4.7 mEq/L (ref 3.5–5.1)
Sodium: 136 — AB (ref 137–147)

## 2022-04-14 LAB — CBC: RBC: 3.83 — AB (ref 3.87–5.11)

## 2022-04-14 LAB — COMPREHENSIVE METABOLIC PANEL
Albumin: 4.3 (ref 3.5–5.0)
Calcium: 9.2 (ref 8.7–10.7)
Globulin: 1.6
eGFR: 56

## 2022-04-14 LAB — HEPATIC FUNCTION PANEL
ALT: 16 U/L (ref 7–35)
AST: 15 (ref 13–35)
Alkaline Phosphatase: 94 (ref 25–125)
Bilirubin, Total: 0.3

## 2022-04-16 ENCOUNTER — Ambulatory Visit
Admission: RE | Admit: 2022-04-16 | Discharge: 2022-04-16 | Disposition: A | Discharge status: Home / Self Care | Payer: Medicare HMO | Source: Ambulatory Visit | Attending: Internal Medicine | Admitting: Internal Medicine

## 2022-04-16 DIAGNOSIS — Z1231 Encounter for screening mammogram for malignant neoplasm of breast: Secondary | ICD-10-CM | POA: Diagnosis not present

## 2022-04-16 DIAGNOSIS — Z780 Asymptomatic menopausal state: Secondary | ICD-10-CM | POA: Diagnosis not present

## 2022-04-16 DIAGNOSIS — M85832 Other specified disorders of bone density and structure, left forearm: Secondary | ICD-10-CM | POA: Diagnosis not present

## 2022-04-16 DIAGNOSIS — M810 Age-related osteoporosis without current pathological fracture: Secondary | ICD-10-CM

## 2022-04-19 ENCOUNTER — Encounter: Payer: Self-pay | Admitting: Internal Medicine

## 2022-04-21 ENCOUNTER — Non-Acute Institutional Stay: Payer: Medicare HMO | Admitting: Internal Medicine

## 2022-04-21 ENCOUNTER — Encounter: Payer: Self-pay | Admitting: Internal Medicine

## 2022-04-21 VITALS — BP 146/90 | HR 88 | Temp 97.2°F | Ht 64.0 in | Wt 177.0 lb

## 2022-04-21 DIAGNOSIS — R202 Paresthesia of skin: Secondary | ICD-10-CM

## 2022-04-21 DIAGNOSIS — R69 Illness, unspecified: Secondary | ICD-10-CM | POA: Diagnosis not present

## 2022-04-21 DIAGNOSIS — K580 Irritable bowel syndrome with diarrhea: Secondary | ICD-10-CM

## 2022-04-21 DIAGNOSIS — E039 Hypothyroidism, unspecified: Secondary | ICD-10-CM | POA: Diagnosis not present

## 2022-04-21 DIAGNOSIS — Z9889 Other specified postprocedural states: Secondary | ICD-10-CM | POA: Diagnosis not present

## 2022-04-21 DIAGNOSIS — R200 Anesthesia of skin: Secondary | ICD-10-CM | POA: Diagnosis not present

## 2022-04-21 DIAGNOSIS — F419 Anxiety disorder, unspecified: Secondary | ICD-10-CM | POA: Diagnosis not present

## 2022-04-21 DIAGNOSIS — I10 Essential (primary) hypertension: Secondary | ICD-10-CM | POA: Diagnosis not present

## 2022-04-21 DIAGNOSIS — M545 Low back pain, unspecified: Secondary | ICD-10-CM

## 2022-04-21 DIAGNOSIS — R42 Dizziness and giddiness: Secondary | ICD-10-CM | POA: Diagnosis not present

## 2022-04-21 DIAGNOSIS — E7800 Pure hypercholesterolemia, unspecified: Secondary | ICD-10-CM

## 2022-04-21 DIAGNOSIS — K219 Gastro-esophageal reflux disease without esophagitis: Secondary | ICD-10-CM | POA: Diagnosis not present

## 2022-04-21 DIAGNOSIS — G4733 Obstructive sleep apnea (adult) (pediatric): Secondary | ICD-10-CM | POA: Diagnosis not present

## 2022-04-21 DIAGNOSIS — G8929 Other chronic pain: Secondary | ICD-10-CM

## 2022-04-21 DIAGNOSIS — F339 Major depressive disorder, recurrent, unspecified: Secondary | ICD-10-CM

## 2022-04-22 NOTE — Progress Notes (Signed)
Location:  [name]:   Code Status:  Goals of Care:     04/21/2022    9:36 AM  Advanced Directives  Does Patient Have a Medical Advance Directive? Yes  Type of Paramedic of [place];Living will  Does patient want to make changes to medical advance directive? No - Patient declined  Copy of Sophia Nash in Chart? Yes - validated most recent copy scanned in chart (See row information)     Chief Complaint  Patient presents with   Acute Visit    Complains of Numbness in Left Hand and Vertigo. Has an appointment with Dr. Ninfa Linden 04/29/22. Wants advice on Radiofrequency with pain management.     HPI: Patient is a 87 y.o. female seen today for medical management of chronic diseases.    [place]   history of hypertension, arthritis, depression, TSH, GERD, HLD, IBS with diarrhea And Anxiety and Sleep apnea H/o Fall in 09/22 sustaining closed compression fracture and underwent elective kyphoplasty of T12 and L1 in 01/23   Seen a week ago for her dizziness.  I diagnosed her with vertigo and gave her meclizine.  Patient states that her symptoms are now gone.  And she is back to her normal self Left hand numbness.  Plan is to see Ortho Low back pain Wanted my opinion on radiofrequency.  Depression Mostly situational because of her moved to [place].  She is planning to sell her house On high-dose of Cymbalta and sees psychologist.  Also taking Klonopin.prn Past Medical History:  Diagnosis Date   Allergy    Anemia    Anginal pain ([place])    Anxiety    Arthritis    Atypical moles    Breast lump    left   Chronic kidney disease    Colon polyp    Depression    Diverticulitis    04-15-15 no recent issues   Dysrhythmia 01/2020   Edema    legs, hands  bilateral   GERD (gastroesophageal reflux disease)    hx of    Heart murmur 01/2020   High cholesterol    Per new patient  form   Hypertension    Hypothyroidism    IBS (irritable bowel syndrome)    Per new patient form   Impaired hearing    bilaterally some hearing loss-no hearing aids   Lactose intolerance    Per new patient form   Macular degeneration    Per new patient form   Mild cognitive impairment with memory loss 08/02/2000   per Dr Berdine Addison, neuro, 04-15-15 no changes-"stable"   Recurrent upper respiratory infection (URI)    cold with sinus drainage x several months- saw PCP 10/08/11- chest x ray report on chart.  no fever,resolved, no recent issues as of 04-15-15.   Shoulder joint pain    04-07-15 "tripped-loss balance, fell awkwardly and "wrenched shoulder-left", Known "bad right shoulder"bone on bone"   Skin lesion of breast    hx of right lateral breast  lesion-tx. with multipe rounds oral antibiotic- resolved, no reoccurrence.   Sleep apnea    Thyroid disease    hypothyroidism    Past Surgical History:  Procedure Laterality Date   BREAST SURGERY     lumpectomy- left-benign   CATARACT EXTRACTION, BILATERAL Bilateral    CHOLECYSTECTOMY     laparoscopic   COLECTOMY  10/20/2011   sigmoid colectomy   COLONOSCOPY  2018   DG  BONE DENSITY ([place] HX)  2021   [name], PA; [facility]/ Per new patient form   ESOPHAGOGASTRODUODENOSCOPY     EYE SURGERY     bilateral cataracts   HERNIA REPAIR     INCISIONAL HERNIA REPAIR N/A 01/12/2013   Procedure: LAPAROSCOPIC/OPEN REPAIR INCISIONAL HERNIA;  Surgeon: [name], MD;  Location: [facility];  Service: General;  Laterality: N/A;  With Mesh   INCISIONAL HERNIA REPAIR N/A 04/23/2015   Procedure: LAPAROSCOPIC ASSISTED INCISIONAL VENTRAL HERNIA REPAIR;  Surgeon: [name], MD;  Location: [facility];  Service: General;  Laterality: N/A;   INSERTION OF MESH N/A 04/23/2015   Procedure: INSERTION OF MESH;  Surgeon: [name], MD;  Location: [facility];  Service: General;  Laterality: N/A;   JOINT REPLACEMENT  05/2021   left hip   KYPHOPLASTY  N/A 08/19/2021   Procedure: KYPHOPLASTY THORACIC TWELVE, LUMBAR ONE;  Surgeon: [name], MD;  Location: [place];  Service: Neurosurgery;  Laterality: N/A;  KYPHOPLASTY THORACIC TWELVE, LUMBAR ONE   TOTAL HIP ARTHROPLASTY Left 2022   [facility] [place]; Per new patient form   TUBAL LIGATION  1972    Allergies  Allergen Reactions   Penicillins Anaphylaxis   Codeine     Makes pt overly excited.... Anxious/ hyperactive    Nsaids     Burns stomach     Outpatient Encounter Medications as of 04/21/2022  Medication Sig   amLODipine (NORVASC) 5 MG tablet Take 1 tablet (5 mg total) by mouth daily.   Calcium Carb-Cholecalciferol (CALCIUM 600 + D PO) Take 1 tablet by mouth daily.   carboxymethylcellul-glycerin (LUBRICATING EYE DROPS) 0.5-0.9 % ophthalmic solution Place 1 drop into both eyes daily as needed (dry eyes).   celecoxib (CELEBREX) 100 MG capsule Take 1 capsule (100 mg total) by mouth 2 (two) times daily as needed for moderate pain.   clonazePAM (KLONOPIN) 0.5 MG tablet Take 0.5 mg by mouth 2 (two) times daily as needed for anxiety.   cyanocobalamin 100 MCG tablet Take 100 mcg by mouth daily.   DULoxetine (CYMBALTA) 60 MG capsule Take 1 capsule (60 mg total) by mouth daily.   hyoscyamine (LEVSIN SL) 0.125 MG SL tablet PLACE 1 TABLET (0.125 MG TOTAL) UNDER THE TONGUE 4 (FOUR) TIMES DAILY AS NEEDED FOR CRAMPING.   levothyroxine (SYNTHROID) 50 MCG tablet Take 1 tablet (50 mcg total) by mouth daily before breakfast.   loratadine (CLARITIN) 10 MG tablet Take 10 mg by mouth daily.   meclizine (ANTIVERT) 12.5 MG tablet Take 12.5 mg by mouth 2 (two) times daily as needed for dizziness.   Multiple Vitamins-Minerals (PRESERVISION AREDS 2+MULTI VIT PO) Take 1 capsule by mouth daily.   omeprazole (PRILOSEC) 20 MG capsule Take 1 capsule (20 mg total) by mouth daily.   polyethylene glycol (MIRALAX / GLYCOLAX) 17 g packet Take 17 g by mouth daily as needed.   rosuvastatin  (CRESTOR) 5 MG tablet Take 1 tablet (5 mg total) by mouth daily.   triamterene-hydrochlorothiazide (DYAZIDE) 37.5-25 MG capsule Take 1 each (1 capsule total) by mouth every morning.   [DISCONTINUED] meclizine (ANTIVERT) 12.5 MG tablet Take 1 tablet (12.5 mg total) by mouth 2 (two) times daily. Take BID for 5 days and then take it as needed   No facility-administered encounter medications on file as of 04/21/2022.    Review of Systems:  Review of Systems  Constitutional:  Negative for activity change and appetite change.  HENT: Negative.  Respiratory:  Negative for cough and shortness of breath.   Cardiovascular:  Negative for leg swelling.  Gastrointestinal:  Negative for constipation.  Genitourinary: Negative.   Musculoskeletal:  Positive for back pain. Negative for arthralgias, gait problem and myalgias.  Skin: Negative.   Neurological:  Negative for dizziness and weakness.  Psychiatric/Behavioral:  Negative for confusion, dysphoric mood and sleep disturbance.     Health Maintenance  Topic Date Due   TETANUS/TDAP  Never done   INFLUENZA VACCINE  03/02/2022   COVID-19 Vaccine (2 - Moderna series) 04/19/2022   Pneumonia Vaccine 62+ Years old  Completed   DEXA SCAN  Completed   Zoster Vaccines- Shingrix  Completed   HPV VACCINES  Aged Out    Physical Exam: Vitals:   04/21/22 0932  BP: (!) 146/90  Pulse: 88  Temp: (!) 97.2 F (36.2 C)  SpO2: 96%  Weight: 177 lb (80.3 kg)  Height: '5\' 4"'$  (1.626 m)   Body mass index is 30.38 kg/m. Physical Exam Vitals reviewed.  Constitutional:      Appearance: Normal appearance.  HENT:     Head: Normocephalic.     Nose: Nose normal.     Mouth/Throat:     Mouth: Mucous membranes are moist.     Pharynx: Oropharynx is clear.  Eyes:     Pupils: Pupils are equal, round, and reactive to light.  Cardiovascular:     Rate and Rhythm: Normal rate and regular rhythm.     Pulses: Normal pulses.     Heart sounds: Normal heart sounds. No  murmur heard. Pulmonary:     Effort: Pulmonary effort is normal.     Breath sounds: Normal breath sounds.  Abdominal:     General: Abdomen is flat. Bowel sounds are normal.     Palpations: Abdomen is soft.  Musculoskeletal:        General: No swelling.     Cervical back: Neck supple.  Skin:    General: Skin is warm.  Neurological:     General: No focal deficit present.     Mental Status: She is alert and oriented to person, place, and time.  Psychiatric:        Mood and Affect: Mood normal.        Thought Content: Thought content normal.     Labs reviewed: Basic Metabolic Panel: Recent Labs    07/22/21 0000 01/26/22 0000 04/14/22 0000  NA 133* 133* 136*  K 4.4 4.7 4.7  CL 93* 96* 97*  CO2 22 23* 26*  BUN 16 18  --   CREATININE 1.0 0.9 1.0  CALCIUM 9.6 9.3 9.2  TSH 0.91 1.15  --    Liver Function Tests: Recent Labs    07/22/21 0000 01/26/22 0000 04/14/22 0000  AST '16 16 15  '$ ALT '17 13 16  '$ ALKPHOS 100 93 94  ALBUMIN 4.8 4.5 4.3   No results for input(s): "LIPASE", "AMYLASE" in the last 8760 hours. No results for input(s): "AMMONIA" in the last 8760 hours. CBC: Recent Labs    07/22/21 0000 01/26/22 0000 04/14/22 0000  WBC 7.3 7.2 7.1  NEUTROABS 5.20  --   --   HGB 13.7 13.6 12.8  HCT 40 40 37  PLT 318 296 283   Lipid Panel: Recent Labs    07/22/21 0000 01/26/22 0000  CHOL 193 191  HDL 64 77*  LDLCALC 99 89  TRIG 179* 124   No results found for: "HGBA1C"  Procedures since last visit: MM  3D SCREEN BREAST BILATERAL  Result Date: 04/20/2022 CLINICAL DATA:  Screening. EXAM: DIGITAL SCREENING BILATERAL MAMMOGRAM WITH TOMOSYNTHESIS AND CAD TECHNIQUE: Bilateral screening digital craniocaudal and mediolateral oblique mammograms were obtained. Bilateral screening digital breast tomosynthesis was performed. The images were evaluated with computer-aided detection. COMPARISON:  Previous exam(s). ACR Breast Density Category a: The breast tissue is almost  entirely fatty. FINDINGS: There are no findings suspicious for malignancy. IMPRESSION: No mammographic evidence of malignancy. A result letter of this screening mammogram will be mailed directly to the patient. RECOMMENDATION: Screening mammogram in one year. (Code:SM-B-01Y) BI-RADS CATEGORY  1: Negative. Electronically Signed   By: Lajean Manes M.D.   On: 04/20/2022 12:35   DG BONE DENSITY (DXA)  Result Date: 04/16/2022 EXAM: DUAL X-RAY ABSORPTIOMETRY (DXA) FOR BONE MINERAL DENSITY IMPRESSION: Referring Physician:  Virgie Dad Your patient completed a bone mineral density test using GE Lunar iDXA system (analysis version: 16). Technologist: [place] PATIENT: Name: Sophia Nash, Sophia Nash Patient ID: [id number] Birth Date: 24-Oct-1934 Height: 62.5 in. Sex: Female Measured: 04/16/2022 Weight: 179.0 lbs. Indications: Advanced Age, Estrogen Deficient, Left hip replaced, L-Spine Surgery, Postmenopausal Fractures: Vertebrae Treatments: Calcium (E943.0), Vitamin D (E933.5) ASSESSMENT: The BMD measured at Forearm Radius 33% is 0.769 g/cm2 with a T-score of -1.2. This patient is considered osteopenic/low bone mass according to [facility]) criteria. The quality of the exam is good. The lumbar spine was excluded due to degenerative and surgical changes.Left hip excluded due to surgical hardware. Site Region Measured Date Measured Age [age]A BMD Significant CHANGE T-score Left Forearm Radius 33% 04/16/2024 84.7 -1.2 0.769 g/cm2 Right Femur Neck 04/16/2022 84.7 0.4 1.066 g/cm2 Right Femur Total 6/16/2022 84.7 0.3 1.050 g/cm2 World Health Organization [facility]) criteria for post-menopausal, Caucasian Women: Normal       T-score at or above -1 SD Osteopenia   T-score between -1 and -2.5 SD Osteoporosis T-score at or below -2.5 SD RECOMMENDATION: 1. All patients should optimize calcium and vitamin D intake. 2. Consider FDA-approved medical therapies in postmenopausal women and men aged 65 years and older, based on the  following: a. A hip or vertebral (clinical or morphometric) fracture. b. T-score = -2.5 at the femoral neck or spine after appropriate evaluation to exclude secondary causes. c. Low bone mass (T-score between -1.0 and -2.5 at the femoral neck or spine) and a 10-year probability of a hip fracture = 3% or a 10-year probability of a major osteoporosis-related fracture = 20% based on the [place]-adapted WHO algorithm. d. Clinician judgment and/or patient preferences may indicate treatment for people with 10-year fracture probabilities above or below these levels. FOLLOW-UP: Patients with diagnosis of osteoporosis or at high risk for fracture should have regular bone mineral density tests. Patients eligible for Medicare are allowed routine testing every 2 years. The testing frequency can be increased to one year for patients who have rapidly progressing disease, are receiving or discontinuing medical therapy to restore bone mass, or have additional risk factors. I have reviewed this study and agree with the findings. [facility] FRAX* 10-year Probability of Fracture Based on femoral neck BMD: Femur (Right) Major Osteoporotic Fracture: 7.7% Hip Fracture:                1.1% Population:                  [place] (Caucasian) Risk Factors:                None *FRAX is a Materials engineer of the  [employer] for Metabolic Bone Disease, a World Pharmacologist (WHO) [facility]. ASSESSMENT: The probability of a major osteoporotic fracture is 7.7 % within the next ten years. The probability of a hip fracture is 1.1 % within the next ten years. Electronically Signed   By: Ammie Ferrier M.D.   On: 04/16/2022 13:56    Assessment/Plan 1. Vertigo Can use Meclizine PRn  2. Essential hypertension Norvasc and Dyazide BP slightly running high Will continue to monitor here in [place]  3. Anxiety Takes Klonopin as needed  4. Numbness and tingling in left hand Has appointment with  Ortho for carpal tunnel  5. Hypothyroidism, unspecified type TSH normal in 623  6. Pure hypercholesterolemia Statin   7. Chronic bilateral low back pain without sciatica We discussed that the radiofrequency can work for short-term.  Patient is going to consider it. Has reduced her Celebrex to 1 tablet a day but then now is taking Tylenol almost 2 tablets a day 8. Depression, recurrent ([place]) On Cymbalta.  Plans to see Dr. Casimiro Needle  9. Gastroesophageal reflux disease without esophagitis Prilosec  10. S/P kyphoplasty   11. Irritable bowel syndrome with diarrhea Miralax PRN and Levsin PRn  12. Obstructive sleep apnea syndrome Not using her CPAP 13 BMD T score  -1.2 in Radius Femur 0.2   Labs/tests ordered:  * No order type specified * Next appt:  05/31/2022

## 2022-04-29 ENCOUNTER — Ambulatory Visit: Payer: Medicare HMO | Admitting: Orthopaedic Surgery

## 2022-04-29 ENCOUNTER — Encounter: Payer: Self-pay | Admitting: Orthopaedic Surgery

## 2022-04-29 DIAGNOSIS — R200 Anesthesia of skin: Secondary | ICD-10-CM | POA: Diagnosis not present

## 2022-04-29 DIAGNOSIS — R202 Paresthesia of skin: Secondary | ICD-10-CM | POA: Diagnosis not present

## 2022-04-29 NOTE — Progress Notes (Signed)
The patient is a very pleasant 87 year old female that I am seeing for the first time.  She does ambulate with a rolling walker.  She comes in with a history of left hand numbness that is been going on for 2 years and getting worse.  She said she woke up after hip replacement surgery in [place] 2 years ago and had numbness in her left hand.  They felt it may have been anxiety related concerns she does have anxiety and she took some Klonopin.  She still had numbness and tingling but eventually it got to where it was only intermittent and did not bother her much.  She says now although she has occasional neck pain but has constant numbness and tingling in her left hand.  She is right-hand dominant.  She said that it does affect her actives daily living with buttoning things and that she feels like there is weakness in that left hand as well.  In January of this year she did have a kyphoplasty of her lower lumbar spine due to fractures.  She does take Celebrex daily and Tylenol daily.  Again she does ambulate also with a rolling walker.  On examination of both hands there is no significant muscle atrophy but she does have significant arthritic changes in the IP joints of both hands.  I feel like her grip strength and pinch strength is only slightly less on the left side compared to her dominant side.  There is no Tinel's sign at the left elbow but at the left hand she says it feels "funny to her".  However she does report global numbness in her entire left hand.  She does have some neck pain but not a true Spurling sign to the left side.  I would like to send her to Dr. Ernestina Patches for left upper extremity nerve conduction studies to hopefully help determine where she may be having nerve compression and to what degree so we can then address that appropriately.  If it ends up coming from her cervical spine, on her next visit we will get an AP and lateral of the cervical spine.  We will see her back once we have her  seen by Dr. Ernestina Patches for the studies.  She is in agreement with this plan.

## 2022-04-29 NOTE — Addendum Note (Signed)
Addended byLaurann Montana on: 04/29/2022 01:58 PM   Modules accepted: Orders

## 2022-05-04 ENCOUNTER — Encounter: Payer: Self-pay | Admitting: Physical Medicine and Rehabilitation

## 2022-05-04 ENCOUNTER — Ambulatory Visit: Payer: Medicare HMO | Admitting: Physical Medicine and Rehabilitation

## 2022-05-04 ENCOUNTER — Telehealth: Payer: Self-pay | Admitting: Orthopaedic Surgery

## 2022-05-04 DIAGNOSIS — M25522 Pain in left elbow: Secondary | ICD-10-CM

## 2022-05-04 DIAGNOSIS — M79642 Pain in left hand: Secondary | ICD-10-CM | POA: Diagnosis not present

## 2022-05-04 DIAGNOSIS — R202 Paresthesia of skin: Secondary | ICD-10-CM | POA: Diagnosis not present

## 2022-05-04 DIAGNOSIS — R29898 Other symptoms and signs involving the musculoskeletal system: Secondary | ICD-10-CM | POA: Diagnosis not present

## 2022-05-04 NOTE — Telephone Encounter (Signed)
Results are not ready yet. Waiting for this to schedule

## 2022-05-04 NOTE — Telephone Encounter (Signed)
Pt was seen today by Dr.Newton. He did =tell pt she has carpal tunnel and its pretty severe. Can we work her in sooner than 10/16?

## 2022-05-05 NOTE — Telephone Encounter (Signed)
Pt wants to be seen sooner. When are the results usually available?

## 2022-05-05 NOTE — Telephone Encounter (Signed)
I called and offered pt Monday. She has to check with her daughter. She asked I call her back tomorrow

## 2022-05-06 NOTE — Telephone Encounter (Signed)
Called pt and scheduled appt

## 2022-05-06 NOTE — Procedures (Signed)
EMG & NCV Findings: Evaluation of the left median motor nerve showed prolonged distal onset latency (9.2 ms), reduced amplitude (0.1 mV), and decreased conduction velocity (Elbow-Wrist, 32 m/s).  The left median (across palm) sensory nerve showed prolonged distal peak latency (Wrist, 4.4 ms), reduced amplitude (1.8 V), and prolonged distal peak latency (Palm, 3.3 ms).  The left ulnar sensory nerve showed prolonged distal peak latency (3.9 ms) and decreased conduction velocity (Wrist-5th Digit, 36 m/s).  All remaining nerves (as indicated in the following tables) were within normal limits.    Needle evaluation of the left abductor pollicis brevis muscle showed increased insertional activity, increased spontaneous activity, increased motor unit amplitude, and diminished recruitment.  All remaining muscles (as indicated in the following table) showed no evidence of electrical instability.    Impression: The above electrodiagnostic study is ABNORMAL and reveals evidence of a severe left median nerve entrapment at the wrist (carpal tunnel syndrome) affecting sensory and motor components. The lesion is characterized by sensory and motor demyelination with evidence of axonal injury.   There is no significant electrodiagnostic evidence of any other focal nerve entrapment, brachial plexopathy or cervical radiculopathy.   Recommendations: 1.  Follow-up with referring physician. 2.  Continue current management of symptoms. 3.  Suggest surgical evaluation.  ___________________________ Laurence Spates FAAPMR Board Certified, American Board of Physical Medicine and Rehabilitation    Nerve Conduction Studies Anti Sensory Summary Table   Stim Site NR Peak (ms) Norm Peak (ms) P-T Amp (V) Norm P-T Amp Site1 Site2 Delta-P (ms) Dist (cm) Vel (m/s) Norm Vel (m/s)  Left Median Acr Palm Anti Sensory (2nd Digit)  30.7C  Wrist    *4.4 <3.6 *1.8 >10 Wrist Palm 1.1 0.0    Palm    *3.3 <2.0 10.2         Left Radial  Anti Sensory (Base 1st Digit)  30.8C  Wrist    2.1 <3.1 43.8  Wrist Base 1st Digit 2.1 0.0    Left Ulnar Anti Sensory (5th Digit)  31.2C  Wrist    *3.9 <3.7 29.7 >15.0 Wrist 5th Digit 3.9 14.0 *36 >38   Motor Summary Table   Stim Site NR Onset (ms) Norm Onset (ms) O-P Amp (mV) Norm O-P Amp Site1 Site2 Delta-0 (ms) Dist (cm) Vel (m/s) Norm Vel (m/s)  Left Median Motor (Abd Poll Brev)  31C  Wrist    *9.2 <4.2 *0.1 >5 Elbow Wrist 6.3 20.3 *32 >50  Elbow    15.5  0.1         Left Ulnar Motor (Abd Dig Min)  31.3C  Wrist    3.2 <4.2 9.8 >3 B Elbow Wrist 3.8 20.0 53 >53  B Elbow    7.0  9.6  A Elbow B Elbow 1.5 10.0 67 >53  A Elbow    8.5  8.7          EMG   Side Muscle Nerve Root Ins Act Fibs Psw Amp Dur Poly Recrt Int Fraser Din Comment  Left Abd Poll Brev Median C8-T1 *Incr *3+ *3+ *Incr Nml 0 *Reduced Nml   Left 1stDorInt Ulnar C8-T1 Nml Nml Nml Nml Nml 0 Nml Nml   Left PronatorTeres Median C6-7 Nml Nml Nml Nml Nml 0 Nml Nml     Nerve Conduction Studies Anti Sensory Left/Right Comparison   Stim Site L Lat (ms) R Lat (ms) L-R Lat (ms) L Amp (V) R Amp (V) L-R Amp (%) Site1 Site2 L Vel (m/s) R Vel (m/s) L-R Vel (  m/s)  Median Acr Palm Anti Sensory (2nd Digit)  30.7C  Wrist *4.4   *1.8   Wrist Palm     Palm *3.3   10.2         Radial Anti Sensory (Base 1st Digit)  30.8C  Wrist 2.1   43.8   Wrist Base 1st Digit     Ulnar Anti Sensory (5th Digit)  31.2C  Wrist *3.9   29.7   Wrist 5th Digit *36     Motor Left/Right Comparison   Stim Site L Lat (ms) R Lat (ms) L-R Lat (ms) L Amp (mV) R Amp (mV) L-R Amp (%) Site1 Site2 L Vel (m/s) R Vel (m/s) L-R Vel (m/s)  Median Motor (Abd Poll Brev)  31C  Wrist *9.2   *0.1   Elbow Wrist *32    Elbow 15.5   0.1         Ulnar Motor (Abd Dig Min)  31.3C  Wrist 3.2   9.8   B Elbow Wrist 53    B Elbow 7.0   9.6   A Elbow B Elbow 67    A Elbow 8.5   8.7            Waveforms:

## 2022-05-06 NOTE — Progress Notes (Signed)
Sophia Nash - 87 y.o. female MRN [id number]  Date of birth: 1935-03-08  Office Visit Note: Visit Date: 05/04/2022 PCP: Virgie Dad, MD Referred by: Mcarthur Rossetti*  Subjective: Chief Complaint  Patient presents with   Left Hand - Numbness   HPI: Sophia Nash is a 87 y.o. female who comes in today at the request of Dr. Jean Rosenthal for evaluation and management of chronic worsening severe left hand pain with numbness and tingling.  Patient is Right hand dominant and reports a several year history of numbness tingling and pain in the left hand.  Symptoms are somewhat global in distribution but can be more radial digits distribution.  No real symptoms down the arm except can get pain from the elbow and tingling from the elbow down to the hand.  She reports a couple of years ago having hip replacement surgery in [facility] and waking up after the surgery with significant numbness in the left hand.  It worried her somewhat she had to take some Klonopin during that time because of anxiety issues.  However it did seem to improve and become more intermittent but now over the last several months has become progressively more constant.  She has difficulty with fine motor skills and some weakness in the hand.  She has had a history even before that of sometimes having nocturnal complaints.  No prior electrodiagnostic study.  Does have a history of lumbar spine kyphoplasty in 2023.  She reports that kyphoplasty really was not beneficial still has a lot of back pain.  She does ambulate with a rolling walker.  No cervical spine imaging or prior cervical spine surgery.  She is not diabetic.      Review of Systems  Musculoskeletal:  Positive for back pain and joint pain.  Neurological:  Positive for tingling and sensory change.  All other systems reviewed and are negative.  Otherwise per HPI.  Assessment & Plan: Visit Diagnoses:    ICD-10-CM   1. Paresthesia of skin  R20.2  NCV with EMG (electromyography)    2. Weakness of left hand  R29.898     3. Pain in left hand  M79.642     4. Pain in left elbow  M25.522        Plan: Impression:  Chronic worsening severe left hand pain with paresthesias are consistent with a combination of osteoarthritic joint dysfunction as well as carpal tunnel syndrome.  Symptoms have occurred off and on intermittently but then got worse 2 years ago and since that time is been fairly constant.  Electrodiagnostic study performed today.  The above electrodiagnostic study is ABNORMAL and reveals evidence of a severe left median nerve entrapment at the wrist (carpal tunnel syndrome) affecting sensory and motor components. The lesion is characterized by sensory and motor demyelination with evidence of axonal injury.   There is no significant electrodiagnostic evidence of any other focal nerve entrapment, brachial plexopathy or cervical radiculopathy.   Recommendations: 1.  Follow-up with referring physician. 2.  Continue current management of symptoms. 3.  Suggest surgical evaluation.  Patient does have some motor unit activation on needle EMG.  This would portend a better outcome with increasing strength with appropriate decompressive therapy but she may have residual numbness.  Meds & Orders: No orders of the defined types were placed in this encounter.   Orders Placed This Encounter  Procedures   NCV with EMG (electromyography)    Follow-up: Return for Jean Rosenthal, MD.  Procedures: No procedures performed  EMG & NCV Findings: Evaluation of the left median motor nerve showed prolonged distal onset latency (9.2 ms), reduced amplitude (0.1 mV), and decreased conduction velocity (Elbow-Wrist, 32 m/s).  The left median (across palm) sensory nerve showed prolonged distal peak latency (Wrist, 4.4 ms), reduced amplitude (1.8 V), and prolonged distal peak latency (Palm, 3.3 ms).  The left ulnar sensory nerve showed prolonged  distal peak latency (3.9 ms) and decreased conduction velocity (Wrist-5th Digit, 36 m/s).  All remaining nerves (as indicated in the following tables) were within normal limits.    Needle evaluation of the left abductor pollicis brevis muscle showed increased insertional activity, increased spontaneous activity, increased motor unit amplitude, and diminished recruitment.  All remaining muscles (as indicated in the following table) showed no evidence of electrical instability.    Impression: The above electrodiagnostic study is ABNORMAL and reveals evidence of a severe left median nerve entrapment at the wrist (carpal tunnel syndrome) affecting sensory and motor components. The lesion is characterized by sensory and motor demyelination with evidence of axonal injury.   There is no significant electrodiagnostic evidence of any other focal nerve entrapment, brachial plexopathy or cervical radiculopathy.   Recommendations: 1.  Follow-up with referring physician. 2.  Continue current management of symptoms. 3.  Suggest surgical evaluation.  ___________________________ Laurence Spates FAAPMR Board Certified, American Board of Physical Medicine and Rehabilitation    Nerve Conduction Studies Anti Sensory Summary Table   Stim Site NR Peak (ms) Norm Peak (ms) P-T Amp (V) Norm P-T Amp Site1 Site2 Delta-P (ms) Dist (cm) Vel (m/s) Norm Vel (m/s)  Left Median Acr Palm Anti Sensory (2nd Digit)  30.7C  Wrist    *4.4 <3.6 *1.8 >10 Wrist Palm 1.1 0.0    Palm    *3.3 <2.0 10.2         Left Radial Anti Sensory (Base 1st Digit)  30.8C  Wrist    2.1 <3.1 43.8  Wrist Base 1st Digit 2.1 0.0    Left Ulnar Anti Sensory (5th Digit)  31.2C  Wrist    *3.9 <3.7 29.7 >15.0 Wrist 5th Digit 3.9 14.0 *36 >38   Motor Summary Table   Stim Site NR Onset (ms) Norm Onset (ms) O-P Amp (mV) Norm O-P Amp Site1 Site2 Delta-0 (ms) Dist (cm) Vel (m/s) Norm Vel (m/s)  Left Median Motor (Abd Poll Brev)  31C  Wrist    *9.2  <4.2 *0.1 >5 Elbow Wrist 6.3 20.3 *32 >50  Elbow    15.5  0.1         Left Ulnar Motor (Abd Dig Min)  31.3C  Wrist    3.2 <4.2 9.8 >3 B Elbow Wrist 3.8 20.0 53 >53  B Elbow    7.0  9.6  A Elbow B Elbow 1.5 10.0 67 >53  A Elbow    8.5  8.7          EMG   Side Muscle Nerve Root Ins Act Fibs Psw Amp Dur Poly Recrt Int Fraser Din Comment  Left Abd Poll Brev Median C8-T1 *Incr *3+ *3+ *Incr Nml 0 *Reduced Nml   Left 1stDorInt Ulnar C8-T1 Nml Nml Nml Nml Nml 0 Nml Nml   Left PronatorTeres Median C6-7 Nml Nml Nml Nml Nml 0 Nml Nml     Nerve Conduction Studies Anti Sensory Left/Right Comparison   Stim Site L Lat (ms) R Lat (ms) L-R Lat (ms) L Amp (V) R Amp (V) L-R Amp (%) Site1 Site2 L Vel (  m/s) R Vel (m/s) L-R Vel (m/s)  Median Acr Palm Anti Sensory (2nd Digit)  30.7C  Wrist *4.4   *1.8   Wrist Palm     Palm *3.3   10.2         Radial Anti Sensory (Base 1st Digit)  30.8C  Wrist 2.1   43.8   Wrist Base 1st Digit     Ulnar Anti Sensory (5th Digit)  31.2C  Wrist *3.9   29.7   Wrist 5th Digit *36     Motor Left/Right Comparison   Stim Site L Lat (ms) R Lat (ms) L-R Lat (ms) L Amp (mV) R Amp (mV) L-R Amp (%) Site1 Site2 L Vel (m/s) R Vel (m/s) L-R Vel (m/s)  Median Motor (Abd Poll Brev)  31C  Wrist *9.2   *0.1   Elbow Wrist *32    Elbow 15.5   0.1         Ulnar Motor (Abd Dig Min)  31.3C  Wrist 3.2   9.8   B Elbow Wrist 53    B Elbow 7.0   9.6   A Elbow B Elbow 67    A Elbow 8.5   8.7            Waveforms:             Clinical History: No specialty comments available.   She reports that she has never smoked. She has never used smokeless tobacco. No results for input(s): "HGBA1C", "LABURIC" in the last 8760 hours.  Objective:  VS:  HT:    WT:   BMI:     BP:   HR: bpm  TEMP: ( )  RESP:  Physical Exam Musculoskeletal:        General: No swelling, tenderness or deformity.     Comments: Inspection reveals [facility] joint and arthritic changes bilaterally with apparent mild  flattening of both APB's but no frank atrophy of the bilateral APB or FDI or hand intrinsics. There is no swelling, color changes, allodynia or dystrophic changes. There is 5 out of 5 strength in the bilateral wrist extension, finger abduction and long finger flexion. There is intact sensation to light touch in all dermatomal and peripheral nerve distributions.  There is a negative Hoffmann's test bilaterally.  Skin:    General: Skin is warm and dry.     Findings: No erythema or rash.  Neurological:     General: No focal deficit present.     Mental Status: She is alert and oriented to person, place, and time.     Motor: No weakness or abnormal muscle tone.     Coordination: Coordination normal.  Psychiatric:        Mood and Affect: Mood normal.        Behavior: Behavior normal.     Ortho Exam  Imaging: No results found.  Past Medical/Family/Surgical/Social History: Medications & Allergies reviewed per EMR, new medications updated. Patient Active Problem List   Diagnosis Date Noted   S/P kyphoplasty 09/02/2021   Essential hypertension 09/02/2021   Pure hypercholesterolemia 09/02/2021   Depression, recurrent ([place]) 09/02/2021   Anxiety 09/02/2021   Gastroesophageal reflux disease without esophagitis 09/02/2021   Irritable bowel syndrome with diarrhea 09/02/2021   Hypothyroidism 09/02/2021   Recurrent ventral incisional hernia 04/23/2015   H/O ventral hernia repair 01/14/2013   Diverticulitis of colon 09/23/2011   Past Medical History:  Diagnosis Date   Allergy    Anemia    Anginal pain ([place])  Anxiety    Arthritis    Atypical moles    Breast lump    left   Chronic kidney disease    Colon polyp    Depression    Diverticulitis    04-15-15 no recent issues   Dysrhythmia 01/2020   Edema    legs, hands  bilateral   GERD (gastroesophageal reflux disease)    hx of    Heart murmur 01/2020   High cholesterol    Per new patient form   Hypertension    Hypothyroidism     IBS (irritable bowel syndrome)    Per new patient form   Impaired hearing    bilaterally some hearing loss-no hearing aids   Lactose intolerance    Per new patient form   Macular degeneration    Per new patient form   Mild cognitive impairment with memory loss 08/02/2000   per Dr Berdine Addison, neuro, 04-15-15 no changes-"stable"   Recurrent upper respiratory infection (URI)    cold with sinus drainage x several months- saw PCP 10/08/11- chest x ray report on chart.  no fever,resolved, no recent issues as of 04-15-15.   Shoulder joint pain    04-07-15 "tripped-loss balance, fell awkwardly and "wrenched shoulder-left", Known "bad right shoulder"bone on bone"   Skin lesion of breast    hx of right lateral breast  lesion-tx. with multipe rounds oral antibiotic- resolved, no reoccurrence.   Sleep apnea    Thyroid disease    hypothyroidism   Family History  Problem Relation Age of Onset   Cancer Mother        breast   Heart disease Father    Hypertension Sister    Dementia Sister    Cancer Brother        colon   Heart Problems Brother    High Cholesterol Daughter    Past Surgical History:  Procedure Laterality Date   BREAST SURGERY     lumpectomy- left-benign   CATARACT EXTRACTION, BILATERAL Bilateral    CHOLECYSTECTOMY     laparoscopic   COLECTOMY  10/20/2011   sigmoid colectomy   COLONOSCOPY  2018   DG  BONE DENSITY ([facility]hner HX)  2021   [name], PA; [facility]/ Per new patient form   ESOPHAGOGASTRODUODENOSCOPY     EYE SURGERY     bilateral cataracts   HERNIA REPAIR     INCISIONAL HERNIA REPAIR N/A 01/12/2013   Procedure: LAPAROSCOPIC/OPEN REPAIR INCISIONAL HERNIA;  Surgeon: [name], MD;  Location: [facility];  Service: General;  Laterality: N/A;  With Mesh   INCISIONAL HERNIA REPAIR N/A 04/23/2015   Procedure: LAPAROSCOPIC ASSISTED INCISIONAL VENTRAL HERNIA REPAIR;  Surgeon: [name], MD;  Location: [facility];  Service: General;  Laterality: N/A;   INSERTION OF MESH  N/A 04/23/2015   Procedure: INSERTION OF MESH;  Surgeon: [name], MD;  Location: [facility];  Service: General;  Laterality: N/A;   JOINT REPLACEMENT  05/2021   left hip   KYPHOPLASTY N/A 08/19/2021   Procedure: KYPHOPLASTY THORACIC TWELVE, LUMBAR ONE;  Surgeon: [name], MD;  Location: [place];  Service: Neurosurgery;  Laterality: N/A;  KYPHOPLASTY THORACIC TWELVE, LUMBAR ONE   TOTAL HIP ARTHROPLASTY Left 2022   [facility], [place]; Per new patient form   TUBAL LIGATION  1972   Social History   Occupational History   Not on file  Tobacco Use   Smoking status: Never   Smokeless tobacco: Never  Vaping Use   Vaping Use:  Never used  Substance and Sexual Activity   Alcohol use: No   Drug use: Never   Sexual activity: Not on file

## 2022-05-17 ENCOUNTER — Ambulatory Visit: Payer: Medicare HMO | Admitting: Physician Assistant

## 2022-05-24 ENCOUNTER — Encounter: Payer: Self-pay | Admitting: Orthopaedic Surgery

## 2022-05-24 ENCOUNTER — Ambulatory Visit (INDEPENDENT_AMBULATORY_CARE_PROVIDER_SITE_OTHER): Payer: Medicare HMO | Admitting: Orthopaedic Surgery

## 2022-05-24 DIAGNOSIS — G5602 Carpal tunnel syndrome, left upper limb: Secondary | ICD-10-CM | POA: Diagnosis not present

## 2022-05-24 NOTE — Progress Notes (Signed)
The patient returns for follow-up after having upper extremity nerve conduction studies of the left side by Dr. Ernestina Patches.  She is 87 years old and had significant numbness and tingling and paresthesias in that hand and she also describes weakness in the left hand.  The nerve conduction studies of her left upper extremity show severe median nerve entrapment at the wrist consistent with severe carpal tunnel syndrome.  I explained to the patient in detail what this means.  On examination of her hand there is some muscle atrophy.  There is weak pinch and grip strength as well as numbness.  Her hand is well-perfused.  I talked in detail about carpal tunnel surgery and what to expect based on her age and severity of this disease.  She would not get full recovery but probably enough to give her better quality of life.  This could help with her actives daily living as well.  We may even consider hand therapy after this type of surgery.  She would like to think about this and talk to her family further.  I gave her our surgery scheduler's card to call and let us know if she would like this scheduled for her.

## 2022-05-31 ENCOUNTER — Encounter: Payer: Medicare HMO | Admitting: Adult Health

## 2022-05-31 DIAGNOSIS — F332 Major depressive disorder, recurrent severe without psychotic features: Secondary | ICD-10-CM | POA: Diagnosis not present

## 2022-05-31 DIAGNOSIS — R69 Illness, unspecified: Secondary | ICD-10-CM | POA: Diagnosis not present

## 2022-06-01 NOTE — Progress Notes (Signed)
This encounter was created in error - please disregard.

## 2022-06-21 DIAGNOSIS — M1990 Unspecified osteoarthritis, unspecified site: Secondary | ICD-10-CM | POA: Diagnosis not present

## 2022-06-21 DIAGNOSIS — R69 Illness, unspecified: Secondary | ICD-10-CM | POA: Diagnosis not present

## 2022-06-21 DIAGNOSIS — R32 Unspecified urinary incontinence: Secondary | ICD-10-CM | POA: Diagnosis not present

## 2022-06-21 DIAGNOSIS — M4800 Spinal stenosis, site unspecified: Secondary | ICD-10-CM | POA: Diagnosis not present

## 2022-06-21 DIAGNOSIS — I10 Essential (primary) hypertension: Secondary | ICD-10-CM | POA: Diagnosis not present

## 2022-06-21 DIAGNOSIS — I739 Peripheral vascular disease, unspecified: Secondary | ICD-10-CM | POA: Diagnosis not present

## 2022-06-21 DIAGNOSIS — E669 Obesity, unspecified: Secondary | ICD-10-CM | POA: Diagnosis not present

## 2022-06-21 DIAGNOSIS — J309 Allergic rhinitis, unspecified: Secondary | ICD-10-CM | POA: Diagnosis not present

## 2022-06-21 DIAGNOSIS — H269 Unspecified cataract: Secondary | ICD-10-CM | POA: Diagnosis not present

## 2022-06-21 DIAGNOSIS — E785 Hyperlipidemia, unspecified: Secondary | ICD-10-CM | POA: Diagnosis not present

## 2022-06-21 DIAGNOSIS — E039 Hypothyroidism, unspecified: Secondary | ICD-10-CM | POA: Diagnosis not present

## 2022-07-11 ENCOUNTER — Other Ambulatory Visit: Payer: Self-pay | Admitting: Orthopedic Surgery

## 2022-07-11 DIAGNOSIS — I10 Essential (primary) hypertension: Secondary | ICD-10-CM

## 2022-07-11 DIAGNOSIS — E7800 Pure hypercholesterolemia, unspecified: Secondary | ICD-10-CM

## 2022-07-12 DIAGNOSIS — M47816 Spondylosis without myelopathy or radiculopathy, lumbar region: Secondary | ICD-10-CM | POA: Diagnosis not present

## 2022-07-19 ENCOUNTER — Non-Acute Institutional Stay: Payer: Medicare HMO | Admitting: Adult Health

## 2022-07-19 ENCOUNTER — Encounter: Payer: Self-pay | Admitting: Adult Health

## 2022-07-19 VITALS — BP 150/94 | HR 89 | Temp 97.6°F | Resp 17 | Ht 64.0 in | Wt 177.6 lb

## 2022-07-19 DIAGNOSIS — I10 Essential (primary) hypertension: Secondary | ICD-10-CM | POA: Diagnosis not present

## 2022-07-19 DIAGNOSIS — R69 Illness, unspecified: Secondary | ICD-10-CM | POA: Diagnosis not present

## 2022-07-19 DIAGNOSIS — G8929 Other chronic pain: Secondary | ICD-10-CM | POA: Diagnosis not present

## 2022-07-19 DIAGNOSIS — M545 Low back pain, unspecified: Secondary | ICD-10-CM

## 2022-07-19 DIAGNOSIS — E7800 Pure hypercholesterolemia, unspecified: Secondary | ICD-10-CM

## 2022-07-19 DIAGNOSIS — F4321 Adjustment disorder with depressed mood: Secondary | ICD-10-CM

## 2022-07-19 DIAGNOSIS — G5602 Carpal tunnel syndrome, left upper limb: Secondary | ICD-10-CM | POA: Diagnosis not present

## 2022-07-19 DIAGNOSIS — E039 Hypothyroidism, unspecified: Secondary | ICD-10-CM

## 2022-07-19 NOTE — Progress Notes (Signed)
Location:  [facility]  POS: clinic  Provider:  Cindi Carbon, [place] [phone]   Code Status: Full Goals of Care:     07/19/2022    3:21 PM  Advanced Directives  Does Patient Have a Medical Advance Directive? Yes  Type of Paramedic of [place];Living will  Does patient want to make changes to medical advance directive? No - Patient declined  Copy of [place] in Chart? Yes - validated most recent copy scanned in chart (See row information)     Chief Complaint  Patient presents with  . Medical Management of Chronic Issues    3 month follow up.  . Quality Metric Gaps    Patient is due for AWV, TD and Covd vaccine    HPI: Patient is a 87 y.o. female seen today for medical management of chronic diseases.   [place] due [place] which led to fractures in her spine of L1. She had kyphoplasty 08/19/21.  Has had some chronic back pain and tried Radiofrequency one week ago which helped.  Has cut down on celebrex to one day   Seen for vertigo by Dr Lyndel Safe and prescribed Meclizine in Sept 2023 which has resolved   BMD 07/01/22 showed osteopenia T score -1.2  Seen by surgery for carpal tunnel syndrome 05/24/22 The nerve conduction studies of her left upper extremity show severe median nerve entrapment at the wrist consistent with severe carpal tunnel syndrome.   Seeing a counselor for periods of depression and anxiety. Mood tends to fluctuate. Having trouble adjusting to living in [place]. Has seen Dr Casimiro Needle.   Using cpap machine.   Administers her own meds. MMSE 30/30  Mammogram neg 04/2022  Has a heart murmur, takes clindamycin prior to dental apts.   Past Medical History:  Diagnosis Date  . Allergy   . Anemia   . Anginal pain ([place])   . Anxiety   . Arthritis   . Atypical moles   . Breast lump    left  . Chronic kidney disease   . Colon polyp   . Depression   . Diverticulitis    04-15-15 no  recent issues  . Dysrhythmia 01/2020  . Edema    legs, hands  bilateral  . GERD (gastroesophageal reflux disease)    hx of   . Heart murmur 01/2020  . High cholesterol    Per new patient form  . Hypertension   . Hypothyroidism   . IBS (irritable bowel syndrome)    Per new patient form  . Impaired hearing    bilaterally some hearing loss-no hearing aids  . Lactose intolerance    Per new patient form  . Macular degeneration    Per new patient form  . Mild cognitive impairment with memory loss 08/02/2000   per Dr Berdine Addison, neuro, 04-15-15 no changes-"stable"  . Recurrent upper respiratory infection (URI)    cold with sinus drainage x several months- saw PCP 10/08/11- chest x ray report on chart.  no fever,resolved, no recent issues as of 04-15-15.  Marland Kitchen Shoulder joint pain    04-07-15 "tripped-loss balance, fell awkwardly and "wrenched shoulder-left", Known "bad right shoulder"bone on bone"  . Skin lesion of breast    hx of right lateral breast  lesion-tx. with multipe rounds oral antibiotic- resolved, no reoccurrence.  . Sleep apnea   . Thyroid disease    hypothyroidism    Past Surgical History:  Procedure Laterality Date  .  BREAST SURGERY     lumpectomy- left-benign  . CATARACT EXTRACTION, BILATERAL Bilateral   . CHOLECYSTECTOMY     laparoscopic  . COLECTOMY  10/20/2011   sigmoid colectomy  . COLONOSCOPY  2018  . DG  BONE DENSITY ([place] HX)  2021   [name], PA; [facility]/ Per new patient form  . ESOPHAGOGASTRODUODENOSCOPY    . EYE SURGERY     bilateral cataracts  . HERNIA REPAIR    . INCISIONAL HERNIA REPAIR N/A 01/12/2013   Procedure: LAPAROSCOPIC/OPEN REPAIR INCISIONAL HERNIA;  Surgeon: [name], MD;  Location: [facility];  Service: General;  Laterality: N/A;  With Mesh  . INCISIONAL HERNIA REPAIR N/A 04/23/2015   Procedure: LAPAROSCOPIC ASSISTED INCISIONAL VENTRAL HERNIA REPAIR;  Surgeon: [name], MD;  Location: [facility];  Service: General;  Laterality: N/A;   . INSERTION OF MESH N/A 04/23/2015   Procedure: INSERTION OF MESH;  Surgeon: [name], MD;  [facility];  Service: General;  Laterality: N/A;  . JOINT REPLACEMENT  05/2021   left hip  . KYPHOPLASTY N/A 08/19/2021   Procedure: KYPHOPLASTY THORACIC TWELVE, LUMBAR ONE;  Surgeon: [name], MD;  Location: [facility];  Service: Neurosurgery;  Laterality: N/A;  KYPHOPLASTY THORACIC TWELVE, LUMBAR ONE  . TOTAL HIP ARTHROPLASTY Left 2022   [facility] [place]; Per new patient form  . TUBAL LIGATION  1972    Allergies  Allergen Reactions  . Penicillins Anaphylaxis  . Codeine     Makes pt overly excited.... Anxious/ hyperactive   . Nsaids     Burns stomach     Outpatient Encounter Medications as of 07/19/2022  Medication Sig  . amLODipine (NORVASC) 5 MG tablet TAKE 1 TABLET (5 MG TOTAL) BY MOUTH DAILY.  . Calcium Carb-Cholecalciferol (CALCIUM 600 + D PO) Take 1 tablet by mouth daily.  . celecoxib (CELEBREX) 100 MG capsule Take 1 capsule (100 mg total) by mouth 2 (two) times daily as needed for moderate pain. (Patient taking differently: Take 100 mg by mouth daily.)  . clonazePAM (KLONOPIN) 0.5 MG tablet Take 0.5 mg by mouth 2 (two) times daily as needed for anxiety.  . cyanocobalamin 100 MCG tablet Take 100 mcg by mouth daily.  . DULoxetine (CYMBALTA) 60 MG capsule Take 1 capsule (60 mg total) by mouth daily.  . hyoscyamine (LEVSIN SL) 0.125 MG SL tablet PLACE 1 TABLET (0.125 MG TOTAL) UNDER THE TONGUE 4 (FOUR) TIMES DAILY AS NEEDED FOR CRAMPING.  Marland Kitchen levothyroxine (SYNTHROID) 50 MCG tablet Take 1 tablet (50 mcg total) by mouth daily before breakfast.  . loratadine (CLARITIN) 10 MG tablet Take 10 mg by mouth daily.  . meclizine (ANTIVERT) 12.5 MG tablet Take 12.5 mg by mouth 2 (two) times daily as needed for dizziness.  . Multiple Vitamins-Minerals (PRESERVISION AREDS 2+MULTI VIT PO) Take 1 capsule by mouth daily.  Marland Kitchen omeprazole (PRILOSEC) 20 MG capsule Take 1  capsule (20 mg total) by mouth daily.  . polyethylene glycol (MIRALAX / GLYCOLAX) 17 g packet Take 17 g by mouth daily as needed.  . rosuvastatin (CRESTOR) 5 MG tablet TAKE 1 TABLET (5 MG TOTAL) BY MOUTH DAILY.  Marland Kitchen triamterene-hydrochlorothiazide (DYAZIDE) 37.5-25 MG capsule Take 1 each (1 capsule total) by mouth every morning.  . carboxymethylcellul-glycerin (LUBRICATING EYE DROPS) 0.5-0.9 % ophthalmic solution Place 1 drop into both eyes daily as needed (dry eyes). (Patient not taking: Reported on 07/19/2022)   No facility-administered encounter medications on file as of 07/19/2022.    Review  of Systems:  Review of Systems  Constitutional:  Positive for activity change. Negative for appetite change, chills, diaphoresis, fatigue, fever and unexpected weight change.  HENT:  Negative for congestion.        Teeth breaking off  Respiratory:  Negative for cough, shortness of breath and wheezing.   Cardiovascular:  Negative for chest pain, palpitations and leg swelling.  Gastrointestinal:  Positive for constipation. Negative for abdominal distention, abdominal pain and diarrhea.  Genitourinary:  Negative for difficulty urinating and dysuria.  Musculoskeletal:  Positive for back pain and gait problem. Negative for arthralgias, joint swelling and myalgias.  Neurological:  Negative for dizziness, tremors, seizures, syncope, facial asymmetry, speech difficulty, weakness, light-headedness, numbness and headaches.  Psychiatric/Behavioral:  Negative for agitation, behavioral problems and confusion.     Health Maintenance  Topic Date Due  . DTaP/Tdap/Td (1 - Tdap) Never done  . Medicare Annual Wellness (AWV)  09/24/2021  . COVID-19 Vaccine (8 - 2023-24 season) 04/02/2022  . Pneumonia Vaccine 32+ Years old  Completed  . INFLUENZA VACCINE  Completed  . DEXA SCAN  Completed  . Zoster Vaccines- Shingrix  Completed  . HPV VACCINES  Aged Out    Physical Exam: Vitals:   07/19/22 1522  BP: (!) 150/94   Pulse: 89  Resp: 17  Temp: 97.6 F (36.4 C)  TempSrc: Temporal  SpO2: 99%  Weight: 177 lb 9.6 oz (80.6 kg)  Height: '5\' 4"'$  (1.626 m)   Body mass index is 30.48 kg/m. Physical Exam Vitals and nursing note reviewed.  Constitutional:      General: She is not in acute distress.    Appearance: She is not diaphoretic.  HENT:     Head: Normocephalic and atraumatic.     Nose: Nose normal.     Mouth/Throat:     Mouth: Mucous membranes are moist.     Pharynx: Oropharynx is clear.  Eyes:     Conjunctiva/sclera: Conjunctivae normal.     Pupils: Pupils are equal, round, and reactive to light.  Neck:     Vascular: No JVD.  Cardiovascular:     Rate and Rhythm: Normal rate and regular rhythm.     Heart sounds: No murmur heard. Pulmonary:     Effort: Pulmonary effort is normal. No respiratory distress.     Breath sounds: Normal breath sounds. No wheezing.  Musculoskeletal:     Cervical back: No rigidity or tenderness.     Right lower leg: No edema.     Left lower leg: No edema.  Lymphadenopathy:     Cervical: No cervical adenopathy.  Skin:    General: Skin is warm and dry.  Neurological:     Mental Status: She is alert and oriented to person, place, and time.  Psychiatric:        Mood and Affect: Mood normal.    Labs reviewed: Basic Metabolic Panel: Recent Labs    07/22/21 0000 01/26/22 0000 04/14/22 0000  NA 133* 133* 136*  K 4.4 4.7 4.7  CL 93* 96* 97*  CO2 22 23* 26*  BUN 16 18  --   CREATININE 1.0 0.9 1.0  CALCIUM 9.6 9.3 9.2  TSH 0.91 1.15  --    Liver Function Tests: Recent Labs    07/22/21 0000 01/26/22 0000 04/14/22 0000  AST '16 16 15  '$ ALT '17 13 16  '$ ALKPHOS 100 93 94  ALBUMIN 4.8 4.5 4.3   No results for input(s): "LIPASE", "AMYLASE" in the last 8760 hours. No results  for input(s): "AMMONIA" in the last 8760 hours. CBC: Recent Labs    07/22/21 0000 01/26/22 0000 04/14/22 0000  WBC 7.3 7.2 7.1  NEUTROABS 5.20  --   --   HGB 13.7 13.6 12.8   HCT 40 40 37  PLT 318 296 283   Lipid Panel: Recent Labs    07/22/21 0000 01/26/22 0000  CHOL 193 191  HDL 64 77*  LDLCALC 99 89  TRIG 179* 124   No results found for: "HGBA1C"  Procedures since last visit: No results found.  Assessment/Plan  1. Compression fracture of L1 vertebra with routine healing, subsequent encounter S/p kyphoplasty of T12 and L1 Continue ultram for pain as well as celebrex Will f/u with surgery and pain clinic  2. Essential hypertension Controlled  Continue Dyazide and norvasc   3. Irritable bowel syndrome with diarrhea Continue probiotic and hycosamine   4. Gastroesophageal reflux disease without esophagitis Continue prilosec 20 mg qd Would not not taper due to regular celebrex use  5. Hypothyroidism, unspecified type TSH 0.905 07/21/21 care everywhere Continue Synthroid  6. Pure hypercholesterolemia LDL 99 in care everywhere documents 07/21/21 Continue crestor  7. Depression, recurrent ([place]) Continue cymbalta  8. Anxiety Continue clonazepam 0.5 mg bid prn   9. Slow transit constipation Miralax 17 grams daily due to constipation from ultram  10. Estrogen deficiency Recommend dexa scan   Labs/tests ordered:  * No order type specified *CBC TSH Lipid CMP Next appt:  3 months  Recommend tdap vaccine   Total time 60 min:  time greater than 50% of total time spent doing pt counseling and coordination of care

## 2022-07-20 ENCOUNTER — Encounter: Payer: Self-pay | Admitting: Adult Health

## 2022-07-20 DIAGNOSIS — H353132 Nonexudative age-related macular degeneration, bilateral, intermediate dry stage: Secondary | ICD-10-CM | POA: Diagnosis not present

## 2022-07-20 DIAGNOSIS — H5212 Myopia, left eye: Secondary | ICD-10-CM | POA: Diagnosis not present

## 2022-07-20 DIAGNOSIS — H3530 Unspecified macular degeneration: Secondary | ICD-10-CM | POA: Diagnosis not present

## 2022-07-20 DIAGNOSIS — H524 Presbyopia: Secondary | ICD-10-CM | POA: Diagnosis not present

## 2022-07-20 DIAGNOSIS — H52223 Regular astigmatism, bilateral: Secondary | ICD-10-CM | POA: Diagnosis not present

## 2022-07-20 DIAGNOSIS — G5602 Carpal tunnel syndrome, left upper limb: Secondary | ICD-10-CM | POA: Insufficient documentation

## 2022-07-22 ENCOUNTER — Non-Acute Institutional Stay: Payer: Medicare HMO | Admitting: Orthopedic Surgery

## 2022-07-22 ENCOUNTER — Encounter: Payer: Self-pay | Admitting: Orthopedic Surgery

## 2022-07-22 DIAGNOSIS — U071 COVID-19: Secondary | ICD-10-CM

## 2022-07-22 NOTE — Progress Notes (Signed)
Location:  [facility] Room Number: 149F Place of Service:  ALF [phone]) Provider:  Windell Moulding, NP   Patient Care Team: Virgie Dad, MD as PCP - General (Internal Medicine) [name], MD (Inactive) as Consulting Physician (General Surgery) Virgie Dad, MD as Consulting Physician (Internal Medicine) Brandy Hale, MD as Referring Physician (Obstetrics and Gynecology) Donato Heinz, MD as Consulting Physician (Cardiology)  Extended Emergency Contact Information Primary Emergency Contact: East,Teresa Address: [address]          [place], [place] [address] [place] Phone: [phone] Mobile Phone: [phone] Relation: Daughter Secondary Emergency Contact: Shane Crutch Address: [address]          [place], [place] [address] [place] of [place] Phone: [phone] Mobile Phone: [phone] Relation: Daughter  Code Status:  Full Code Goals of care: Advanced Directive information    07/22/2022    2:06 PM  Advanced Directives  Does Patient Have a Medical Advance Directive? Yes  Type of Paramedic of [place];Living will  Does patient want to make changes to medical advance directive? No - Patient declined  Copy of [place] in Chart? Yes - validated most recent copy scanned in chart (See row information)     Chief Complaint  Patient presents with   Acute Visit    Patient tested Covid positive.    HPI:  Pt is a 87 y.o. female seen today for an acute visit due to recent positive covid test.   She currently resides on the assisted living unit at [facility] PMH: HTN, HLD, GERD, IBS, h/o ventral hernia repair, h/o kyphoplasty, depression and anxiety.   12/21 she woke up with sore throat, nasal congestion and malaise. Rapid covid positive. Other residents have tested positive for covid within past week. She denies having recent covid  vaccination. Denies fever, body aches, chills, N/V, and cough. She is not interested in taking Paxlovid at this time.    Past Medical History:  Diagnosis Date   Allergy    Anemia    Anginal pain ([place])    Anxiety    Arthritis    Atypical moles    Breast lump    left   Chronic kidney disease    Colon polyp    Depression    Diverticulitis    04-15-15 no recent issues   Dysrhythmia 01/2020   Edema    legs, hands  bilateral   GERD (gastroesophageal reflux disease)    hx of    Heart murmur 01/2020   High cholesterol    Per new patient form   Hypertension    Hypothyroidism    IBS (irritable bowel syndrome)    Per new patient form   Impaired hearing    bilaterally some hearing loss-no hearing aids   Lactose intolerance    Per new patient form   Macular degeneration    Per new patient form   Mild cognitive impairment with memory loss 08/02/2000   per Dr Berdine Addison, neuro, 04-15-15 no changes-"stable"   Recurrent upper respiratory infection (URI)    cold with sinus drainage x several months- saw PCP 10/08/11- chest x ray report on chart.  no fever,resolved, no recent issues as of 04-15-15.   Shoulder joint pain    04-07-15 "tripped-loss balance, fell awkwardly and "wrenched shoulder-left", Known "bad right shoulder"bone on bone"   Skin lesion of breast    hx of right lateral breast  lesion-tx.  with multipe rounds oral antibiotic- resolved, no reoccurrence.   Sleep apnea    Thyroid disease    hypothyroidism   Past Surgical History:  Procedure Laterality Date   BREAST SURGERY     lumpectomy- left-benign   CATARACT EXTRACTION, BILATERAL Bilateral    CHOLECYSTECTOMY     laparoscopic   COLECTOMY  10/20/2011   sigmoid colectomy   COLONOSCOPY  2018   DG  BONE DENSITY ([place] HX)  2021   [name], PA; [facility]/ Per new patient form   ESOPHAGOGASTRODUODENOSCOPY     EYE SURGERY     bilateral cataracts   HERNIA REPAIR     INCISIONAL HERNIA REPAIR N/A 01/12/2013   Procedure:  LAPAROSCOPIC/OPEN REPAIR INCISIONAL HERNIA;  Surgeon: [name], MD;  [facility];  Service: General;  Laterality: N/A;  With Mesh   INCISIONAL HERNIA REPAIR N/A 04/23/2015   Procedure: LAPAROSCOPIC ASSISTED INCISIONAL VENTRAL HERNIA REPAIR;  Surgeon: [name], MD;  [facility];  Service: General;  Laterality: N/A;   INSERTION OF MESH N/A 04/23/2015   Procedure: INSERTION OF MESH;  Surgeon: [name], MD;  [facility];  Service: General;  Laterality: N/A;   JOINT REPLACEMENT  05/2021   left hip   KYPHOPLASTY N/A 08/19/2021   Procedure: KYPHOPLASTY THORACIC TWELVE, LUMBAR ONE;  Surgeon: [name], MD;  Location: [facility];  Service: Neurosurgery;  Laterality: N/A;  KYPHOPLASTY THORACIC TWELVE, LUMBAR ONE   TOTAL HIP ARTHROPLASTY Left 2022   [facility] [place]; Per new patient form   TUBAL LIGATION  1972    Allergies  Allergen Reactions   Penicillins Anaphylaxis   Codeine     Makes pt overly excited.... Anxious/ hyperactive    Nsaids     Burns stomach     Outpatient Encounter Medications as of 07/22/2022  Medication Sig   amLODipine (NORVASC) 5 MG tablet TAKE 1 TABLET (5 MG TOTAL) BY MOUTH DAILY.   Calcium Carb-Cholecalciferol (CALCIUM 600 + D PO) Take 1 tablet by mouth daily.   celecoxib (CELEBREX) 100 MG capsule Take 1 capsule (100 mg total) by mouth 2 (two) times daily as needed for moderate pain.   clonazePAM (KLONOPIN) 0.5 MG tablet Take 0.5 mg by mouth 2 (two) times daily as needed for anxiety.   cyanocobalamin 100 MCG tablet Take 100 mcg by mouth daily.   DULoxetine (CYMBALTA) 60 MG capsule Take 1 capsule (60 mg total) by mouth daily.   hyoscyamine (LEVSIN SL) 0.125 MG SL tablet PLACE 1 TABLET (0.125 MG TOTAL) UNDER THE TONGUE 4 (FOUR) TIMES DAILY AS NEEDED FOR CRAMPING.   levothyroxine (SYNTHROID) 50 MCG tablet Take 1 tablet (50 mcg total) by mouth daily before breakfast.   loratadine (CLARITIN) 10 MG tablet Take  10 mg by mouth daily.   meclizine (ANTIVERT) 12.5 MG tablet Take 12.5 mg by mouth 2 (two) times daily as needed for dizziness.   Multiple Vitamins-Minerals (PRESERVISION AREDS 2+MULTI VIT PO) Take 1 capsule by mouth daily.   omeprazole (PRILOSEC) 20 MG capsule Take 1 capsule (20 mg total) by mouth daily.   polyethylene glycol (MIRALAX / GLYCOLAX) 17 g packet Take 17 g by mouth daily as needed.   rosuvastatin (CRESTOR) 5 MG tablet TAKE 1 TABLET (5 MG TOTAL) BY MOUTH DAILY.   triamterene-hydrochlorothiazide (DYAZIDE) 37.5-25 MG capsule Take 1 each (1 capsule total) by mouth every morning.   [DISCONTINUED] carboxymethylcellul-glycerin (LUBRICATING EYE DROPS) 0.5-0.9 % ophthalmic solution Place 1 drop into both eyes daily as  needed (dry eyes). (Patient not taking: Reported on 07/19/2022)   No facility-administered encounter medications on file as of 07/22/2022.    Review of Systems  Constitutional:  Positive for fatigue. Negative for activity change, appetite change, chills and fever.  HENT:  Positive for congestion and sore throat. Negative for trouble swallowing.   Eyes:  Negative for discharge and itching.  Respiratory:  Negative for cough, shortness of breath and wheezing.   Cardiovascular:  Negative for chest pain and leg swelling.  Gastrointestinal:  Negative for diarrhea, nausea and vomiting.  Musculoskeletal:  Negative for myalgias.  Skin:  Negative for rash.  Neurological:  Positive for weakness. Negative for dizziness and headaches.  Psychiatric/Behavioral:  Negative for dysphoric mood. The patient is not nervous/anxious.     Immunization History  Administered Date(s) Administered   Fluad Quad(high Dose 65+) 05/07/2022   Influenza, High Dose Seasonal PF 04/19/2019, 04/18/2020, 06/17/2021   Influenza-Unspecified 04/19/2019, 04/18/2020, 06/17/2021   Moderna Covid-19 Vaccine Bivalent Booster 59yr & up 08/16/2019, 09/12/2019, 06/29/2021   Moderna Sars-Covid-2 Vaccination 06/17/2020,  03/09/2021, 08/06/2021, 12/17/2021   Pneumococcal Conjugate-13 12/20/2013   Pneumococcal Polysaccharide-23 08/17/2012   Zoster Recombinat (Shingrix) 01/30/2014, 12/18/2020, 04/18/2021   Pertinent  Health Maintenance Due  Topic Date Due   INFLUENZA VACCINE  Completed   DEXA SCAN  Completed      08/19/2021    7:14 AM 10/12/2021    2:47 PM 07/19/2022    3:21 PM 07/22/2022    2:05 PM  Fall Risk  Falls in the past year?  '1 1 1  '$ Was there an injury with Fall?  0 1 1  Fall Risk Category Calculator  '2 3 3  '$ Fall Risk Category  Moderate High High  Patient Fall Risk Level High fall risk Moderate fall risk Moderate fall risk Moderate fall risk  Patient at Risk for Falls Due to  No Fall Risks History of fall(s) History of fall(s)  Fall risk Follow up  Falls evaluation completed Falls evaluation completed Falls evaluation completed   Functional Status Survey:    Vitals:   07/22/22 1357  BP: (!) 179/86  Pulse: 72  Resp: 20  Temp: 97.7 F (36.5 C)  SpO2: 98%  Weight: 181 lb 8 oz (82.3 kg)  Height: '5\' 4"'$  (1.626 m)   Body mass index is 31.15 kg/m. Physical Exam Vitals reviewed.  Constitutional:      General: She is not in acute distress. HENT:     Head: Normocephalic.     Salivary Glands: Right salivary gland is diffusely enlarged. Left salivary gland is diffusely enlarged.     Nose: Rhinorrhea present.     Mouth/Throat:     Mouth: Mucous membranes are moist.     Pharynx: No posterior oropharyngeal erythema.  Eyes:     General:        Right eye: No discharge.        Left eye: No discharge.  Cardiovascular:     Rate and Rhythm: Normal rate and regular rhythm.     Pulses: Normal pulses.     Heart sounds: Normal heart sounds.  Pulmonary:     Effort: Pulmonary effort is normal. No respiratory distress.     Breath sounds: Normal breath sounds. No wheezing or rales.  Abdominal:     General: Bowel sounds are normal. There is no distension.     Palpations: Abdomen is soft.      Tenderness: There is no abdominal tenderness.  Musculoskeletal:     Cervical  back: Neck supple.     Right lower leg: No edema.     Left lower leg: No edema.  Lymphadenopathy:     Cervical: No cervical adenopathy.  Skin:    General: Skin is warm.     Capillary Refill: Capillary refill takes less than 2 seconds.  Neurological:     General: No focal deficit present.     Mental Status: She is alert and oriented to person, place, and time.     Motor: Weakness present.     Gait: Gait abnormal.     Comments: walker  Psychiatric:        Mood and Affect: Mood normal.        Behavior: Behavior normal.     Labs reviewed: Recent Labs    01/26/22 0000 04/14/22 0000  NA 133* 136*  K 4.7 4.7  CL 96* 97*  CO2 23* 26*  BUN 18  --   CREATININE 0.9 1.0  CALCIUM 9.3 9.2   Recent Labs    01/26/22 0000 04/14/22 0000  AST 16 15  ALT 13 16  ALKPHOS 93 94  ALBUMIN 4.5 4.3   Recent Labs    01/26/22 0000 04/14/22 0000  WBC 7.2 7.1  HGB 13.6 12.8  HCT 40 37  PLT 296 283   Lab Results  Component Value Date   TSH 1.15 01/26/2022   No results found for: "HGBA1C" Lab Results  Component Value Date   CHOL 191 01/26/2022   HDL 77 (A) 01/26/2022   LDLCALC 89 01/26/2022   TRIG 124 01/26/2022    Significant Diagnostic Results in last 30 days:  No results found.  Assessment/Plan 1. COVID-19 - 12/21 rapid test positive - symptoms: sore throat, nasal congestion, malaise - lung sounds clear, afebrile - not interested in Paxlovid - start vitamin C 1000 mg po daily x 7 days- family to purchase - encourage warm fluids and soups for sore throat    Family/ staff Communication: plan discussed with patient and nurse  Labs/tests ordered:  none

## 2022-07-29 ENCOUNTER — Non-Acute Institutional Stay: Payer: Medicare HMO | Admitting: Adult Health

## 2022-07-29 ENCOUNTER — Encounter: Payer: Self-pay | Admitting: Adult Health

## 2022-07-29 DIAGNOSIS — R051 Acute cough: Secondary | ICD-10-CM

## 2022-07-29 DIAGNOSIS — U071 COVID-19: Secondary | ICD-10-CM

## 2022-07-29 MED ORDER — ALBUTEROL SULFATE HFA 108 (90 BASE) MCG/ACT IN AERS: 2.0000 | INHALATION_SPRAY | Freq: Four times a day (QID) | RESPIRATORY_TRACT | 0 refills | Status: AC | PRN

## 2022-07-29 NOTE — Progress Notes (Signed)
Location:  [facility] Room Number: 390Z Place of Service:  ALF [phone]) Provider:  Earney Hamburg, MD  Patient Care Team: Virgie Dad, MD as PCP - General (Internal Medicine) [name], MD (Inactive) as Consulting Physician (General Surgery) Virgie Dad, MD as Consulting Physician (Internal Medicine) Brandy Hale, MD as Referring Physician (Obstetrics and Gynecology) Donato Heinz, MD as Consulting Physician (Cardiology)  Extended Emergency Contact Information Primary Emergency Contact: East,Teresa Address: [address]          [place], [place] [address] [place] Phone: [phone] Mobile Phone: [phone] Relation: Daughter Secondary Emergency Contact: Shane Crutch Address: [address]          [place], [place] [address] [place] Phone: [phone] Mobile Phone: [phone] Relation: Daughter  Code Status:  Full Code  Goals of care: Advanced Directive information    07/29/2022   10:09 AM  Advanced Directives  Does Patient Have a Medical Advance Directive? Yes  Type of [place]amed[place] of [place];Living will  Does patient want to make changes to medical advance directive? No - Patient declined  Copy of [place] in Chart? Yes - validated most recent copy scanned in chart (See row information)     Chief Complaint  Patient presents with   Acute Visit    Patient is being seen for covid.   Quality Metric Gaps    Discussed the need for tetanus and covid vaccine.   Can hold off on covid vaccine for 3 months    HPI:  Pt is a 87 y.o. female seen today for an acute visit for covid.   Pt tested positive for covid by rapid antigen testing on 12/28. Declined paxlovid. She has had a cough with yellow sputum and congestion. No shortness of breath or hypoxia. No fever.  She feel she is not getting better. She  had a family die of pneumonia and she is worried. Her daughter Josephina Shih is listening in on the visit via phone. Resident reports that she has used prednisone before for a cough. No hx of asthma. No smoking hx. Last covid vaccine 11/2021.   Past Medical History:  Diagnosis Date   Allergy    Anemia    Anginal pain ([place])    Anxiety    Arthritis    Atypical moles    Breast lump    left   Chronic kidney disease    Colon polyp    Depression    Diverticulitis    04-15-15 no recent issues   Dysrhythmia 01/2020   Edema    legs, hands  bilateral   GERD (gastroesophageal reflux disease)    hx of    Heart murmur 01/2020   High cholesterol    Per new patient form   Hypertension    Hypothyroidism    IBS (irritable bowel syndrome)    Per new patient form   Impaired hearing    bilaterally some hearing loss-no hearing aids   Lactose intolerance    Per new patient form   Macular degeneration    Per new patient form   Mild cognitive impairment with memory loss 08/02/2000   per Dr Berdine Addison, neuro, 04-15-15 no changes-"stable"   Recurrent upper respiratory infection (URI)    cold with sinus drainage x several months- saw PCP 10/08/11- chest x ray report on chart.  no fever,resolved, no recent issues as of 04-15-15.  Shoulder joint pain    04-07-15 "tripped-loss balance, fell awkwardly and "wrenched shoulder-left", Known "bad right shoulder"bone on bone"   Skin lesion of breast    hx of right lateral breast  lesion-tx. with multipe rounds oral antibiotic- resolved, no reoccurrence.   Sleep apnea    Thyroid disease    hypothyroidism   Past Surgical History:  Procedure Laterality Date   BREAST SURGERY     lumpectomy- left-benign   CATARACT EXTRACTION, BILATERAL Bilateral    CHOLECYSTECTOMY     laparoscopic   COLECTOMY  10/20/2011   sigmoid colectomy   COLONOSCOPY  2018   DG  BONE DENSITY ([place] HX)  2021   [name], PA; [facility]/ Per new patient form   ESOPHAGOGASTRODUODENOSCOPY     EYE  SURGERY     bilateral cataracts   HERNIA REPAIR     INCISIONAL HERNIA REPAIR N/A 01/12/2013   Procedure: LAPAROSCOPIC/OPEN REPAIR INCISIONAL HERNIA;  Surgeon: [name], MD;  Location: [facility];  Service: General;  Laterality: N/A;  With Mesh   INCISIONAL HERNIA REPAIR N/A 04/23/2015   Procedure: LAPAROSCOPIC ASSISTED INCISIONAL VENTRAL HERNIA REPAIR;  Surgeon: [name], MD;  Location: [facility];  Service: General;  Laterality: N/A;   INSERTION OF MESH N/A 04/23/2015   Procedure: INSERTION OF MESH;  Surgeon: [name], MD;  [facility]: [facility];  Service: General;  Laterality: N/A;   JOINT REPLACEMENT  05/2021   left hip   KYPHOPLASTY N/A 08/19/2021   Procedure: KYPHOPLASTY THORACIC TWELVE, LUMBAR ONE;  Surgeon: [name], MD;  Location: [facility];  Service: Neurosurgery;  Laterality: N/A;  KYPHOPLASTY THORACIC TWELVE, LUMBAR ONE   TOTAL HIP ARTHROPLASTY Left 2022   [facility], [place]; Per new patient form   TUBAL LIGATION  1972    Allergies  Allergen Reactions   Penicillins Anaphylaxis   Codeine     Makes pt overly excited.... Anxious/ hyperactive    Nsaids     Burns stomach     Outpatient Encounter Medications as of 07/29/2022  Medication Sig   amLODipine (NORVASC) 5 MG tablet TAKE 1 TABLET (5 MG TOTAL) BY MOUTH DAILY.   Calcium Carb-Cholecalciferol (CALCIUM 600 + D PO) Take 1 tablet by mouth daily.   celecoxib (CELEBREX) 100 MG capsule Take 1 capsule (100 mg total) by mouth 2 (two) times daily as needed for moderate pain.   clonazePAM (KLONOPIN) 0.5 MG tablet Take 0.5 mg by mouth 2 (two) times daily as needed for anxiety.   cyanocobalamin 100 MCG tablet Take 100 mcg by mouth daily.   DULoxetine (CYMBALTA) 60 MG capsule Take 1 capsule (60 mg total) by mouth daily.   hyoscyamine (LEVSIN SL) 0.125 MG SL tablet PLACE 1 TABLET (0.125 MG TOTAL) UNDER THE TONGUE 4 (FOUR) TIMES DAILY AS NEEDED FOR CRAMPING.   levothyroxine (SYNTHROID) 50 MCG  tablet Take 1 tablet (50 mcg total) by mouth daily before breakfast.   loratadine (CLARITIN) 10 MG tablet Take 10 mg by mouth daily.   meclizine (ANTIVERT) 12.5 MG tablet Take 12.5 mg by mouth 2 (two) times daily as needed for dizziness.   Multiple Vitamins-Minerals (PRESERVISION AREDS 2+MULTI VIT PO) Take 1 capsule by mouth daily.   omeprazole (PRILOSEC) 20 MG capsule Take 1 capsule (20 mg total) by mouth daily.   polyethylene glycol (MIRALAX / GLYCOLAX) 17 g packet Take 17 g by mouth daily as needed.   rosuvastatin (CRESTOR) 5 MG tablet TAKE 1 TABLET (5 MG TOTAL) BY MOUTH DAILY.  triamterene-hydrochlorothiazide (DYAZIDE) 37.5-25 MG capsule Take 1 each (1 capsule total) by mouth every morning.   No facility-administered encounter medications on file as of 07/29/2022.    Review of Systems  Constitutional:  Positive for activity change and fatigue. Negative for appetite change, chills, diaphoresis, fever and unexpected weight change.  HENT:  Positive for congestion and sore throat. Negative for mouth sores, nosebleeds, postnasal drip, tinnitus and trouble swallowing.   Respiratory:  Positive for cough. Negative for shortness of breath and wheezing.   Cardiovascular:  Negative for chest pain, palpitations and leg swelling.  Gastrointestinal:  Negative for abdominal distention, abdominal pain, constipation and diarrhea.  Genitourinary:  Negative for difficulty urinating and dysuria.  Musculoskeletal:  Negative for arthralgias, back pain, gait problem, joint swelling and myalgias.  Neurological:  Negative for dizziness, tremors, seizures, syncope, facial asymmetry, speech difficulty, weakness, light-headedness, numbness and headaches.  Psychiatric/Behavioral:  Negative for agitation, behavioral problems and confusion.     Immunization History  Administered Date(s) Administered   Fluad Quad(high Dose 65+) 05/07/2022   Influenza, High Dose Seasonal PF 04/19/2019, 04/18/2020, 06/17/2021    Influenza-Unspecified 04/19/2019, 04/18/2020, 06/17/2021   Moderna Covid-19 Vaccine Bivalent Booster 57yr & up 08/16/2019, 09/12/2019, 06/29/2021   Moderna Sars-Covid-2 Vaccination 06/17/2020, 03/09/2021, 08/06/2021, 12/17/2021   Pneumococcal Conjugate-13 12/20/2013   Pneumococcal Polysaccharide-23 08/17/2012   Zoster Recombinat (Shingrix) 01/30/2014, 12/18/2020, 04/18/2021   Pertinent  Health Maintenance Due  Topic Date Due   INFLUENZA VACCINE  Completed   DEXA SCAN  Completed      08/19/2021    7:14 AM 10/12/2021    2:47 PM 07/19/2022    3:21 PM 07/22/2022    2:05 PM  Fall Risk  Falls in the past year?  '1 1 1  '$ Was there an injury with Fall?  0 1 1  Fall Risk Category Calculator  '2 3 3  '$ Fall Risk Category  Moderate High High  Patient Fall Risk Level High fall risk Moderate fall risk Moderate fall risk Moderate fall risk  Patient at Risk for Falls Due to  No Fall Risks History of fall(s) History of fall(s)  Fall risk Follow up  Falls evaluation completed Falls evaluation completed Falls evaluation completed   Functional Status Survey:    Vitals:   07/29/22 1006  BP: 130/80  Pulse: (!) 58  Resp: 20  Temp: (!) 97.1 F (36.2 C)  TempSrc: Temporal  SpO2: 95%  Weight: 181 lb 12.8 oz (82.5 kg)  Height: '5\' 4"'$  (1.626 m)   Body mass index is 31.21 kg/m. Physical Exam Vitals and nursing note reviewed.  Constitutional:      General: She is not in acute distress.    Appearance: She is not diaphoretic.  HENT:     Head: Normocephalic and atraumatic.  Neck:     Vascular: No JVD.  Cardiovascular:     Rate and Rhythm: Normal rate and regular rhythm.     Heart sounds: Murmur heard.  Pulmonary:     Effort: Pulmonary effort is normal. No respiratory distress.     Breath sounds: Normal breath sounds. No wheezing.  Musculoskeletal:     Cervical back: No rigidity or tenderness.  Lymphadenopathy:     Cervical: No cervical adenopathy.  Skin:    General: Skin is warm and dry.   Neurological:     Mental Status: She is alert and oriented to person, place, and time.     Labs reviewed: Recent Labs    01/26/22 0000 04/14/22 0000  NA  133* 136*  K 4.7 4.7  CL 96* 97*  CO2 23* 26*  BUN 18  --   CREATININE 0.9 1.0  CALCIUM 9.3 9.2   Recent Labs    01/26/22 0000 04/14/22 0000  AST 16 15  ALT 13 16  ALKPHOS 93 94  ALBUMIN 4.5 4.3   Recent Labs    01/26/22 0000 04/14/22 0000  WBC 7.2 7.1  HGB 13.6 12.8  HCT 40 37  PLT 296 283   Lab Results  Component Value Date   TSH 1.15 01/26/2022   No results found for: "HGBA1C" Lab Results  Component Value Date   CHOL 191 01/26/2022   HDL 77 (A) 01/26/2022   LDLCALC 89 01/26/2022   TRIG 124 01/26/2022    Significant Diagnostic Results in last 30 days:  No results found.  Assessment/Plan  1. COVID-19 Declined paxlovid when infection started due to concern for med interaction.  Appears to be mildly ill with no cyanosis, no low oxygen or sob.  Keep on isolation for 10 days  Does not meet criteria for steroids at this time. If cough is worsening or wheezing will order. Staff to continue to monitor symptoms and vitals.   2. Acute cough Add albuterol q 6 prn Can use mucinex or liquid robitussin without DM due to high bp hx CXR due to worsening cough and sputum production    Family/ staff Communication: discussed with resident and her daughter.   Labs/tests ordered:  CXR   Total time 41mn:  time greater than 50% of total time spent doing pt counseling and coordination of care

## 2022-07-30 DIAGNOSIS — R059 Cough, unspecified: Secondary | ICD-10-CM | POA: Diagnosis not present

## 2022-08-04 ENCOUNTER — Other Ambulatory Visit: Payer: Self-pay | Admitting: Orthopedic Surgery

## 2022-08-04 ENCOUNTER — Encounter: Payer: Self-pay | Admitting: Orthopedic Surgery

## 2022-08-04 ENCOUNTER — Non-Acute Institutional Stay: Payer: Medicare HMO | Admitting: Orthopedic Surgery

## 2022-08-04 DIAGNOSIS — R300 Dysuria: Secondary | ICD-10-CM

## 2022-08-04 DIAGNOSIS — R350 Frequency of micturition: Secondary | ICD-10-CM | POA: Diagnosis not present

## 2022-08-04 DIAGNOSIS — U071 COVID-19: Secondary | ICD-10-CM

## 2022-08-04 MED ORDER — CIPROFLOXACIN HCL 500 MG PO TABS: 500.0000 mg | ORAL_TABLET | Freq: Two times a day (BID) | ORAL | 0 refills | Status: AC

## 2022-08-04 NOTE — Progress Notes (Signed)
Location:   [facility] Room Number: 161 Place of Service:  ALF [phone]) Provider:  Windell Moulding, NP  Virgie Dad, MD  Patient Care Team: Virgie Dad, MD as PCP - General (Internal Medicine) [name], MD (Inactive) as Consulting Physician (General Surgery) Virgie Dad, MD as Consulting Physician (Internal Medicine) Brandy Hale, MD as Referring Physician (Obstetrics and Gynecology) Donato Heinz, MD as Consulting Physician (Cardiology)  Extended Emergency Contact Information Primary Emergency Contact: East,Teresa Address: [address]          [place], [place] [address] [place] Phone: [phone] Mobile Phone: [phone] Relation: Daughter Secondary Emergency Contact: Shane Crutch Address: [address]          [place],  [address] [place] Phone: [phone] Mobile Phone: [phone] Relation: Daughter  Code Status:   Goals of care: Advanced Directive information    08/04/2022    3:14 PM  Advanced Directives  Does Patient Have a Medical Advance Directive? Yes  Type of Advance Directive [place]  Does patient want to make changes to medical advance directive? No - Patient declined  Copy of [place] in Chart? Yes - validated most recent copy scanned in chart (See row information)     Chief Complaint  Patient presents with   Acute Visit    Urinary requency    HPI:  Pt is a 88 y.o. female seen today for acute visit due to dysuria and increased urinary frequency.   She currently resides on the assisted living unit at [facility] PMH: HTN, HLD, GERD, IBS, hypothyroidism, compression fracture s/p kyphoplasty, carpal tunnel, depression,and anxiety.    01/01 she woke up with  increased dysuria and urinary frequency. Dysuria described as severe. She is scared to urinate due to pain. She recently had covid and recalls  sleeping more and not drinking well. She follows urologist. Reports UTI in the past and being prescribed Cipro. She is requesting to start medication today. She also took AZO yesterday at dinner time. Denies taking dose today. Reports urine back to normal color. Denies flank pain. Afebrile. Vitals stable.   Denies long term symptoms from covid 19.   Past Medical History:  Diagnosis Date   Allergy    Anemia    Anginal pain ([place])    Anxiety    Arthritis    Atypical moles    Breast lump    left   Chronic kidney disease    Colon polyp    Depression    Diverticulitis    04-15-15 no recent issues   Dysrhythmia 01/2020   Edema    legs, hands  bilateral   GERD (gastroesophageal reflux disease)    hx of    Heart murmur 01/2020   High cholesterol    Per new patient form   Hypertension    Hypothyroidism    IBS (irritable bowel syndrome)    Per new patient form   Impaired hearing    bilaterally some hearing loss-no hearing aids   Lactose intolerance    Per new patient form   Macular degeneration    Per new patient form   Mild cognitive impairment with memory loss 08/02/2000   per Dr Berdine Addison, neuro, 04-15-15 no changes-"stable"   Recurrent upper respiratory infection (URI)    cold with sinus drainage x several months- saw PCP 10/08/11- chest x ray report on chart.  no fever,resolved, no recent issues as  of 04-15-15.   Shoulder joint pain    04-07-15 "tripped-loss balance, fell awkwardly and "wrenched shoulder-left", Known "bad right shoulder"bone on bone"   Skin lesion of breast    hx of right lateral breast  lesion-tx. with multipe rounds oral antibiotic- resolved, no reoccurrence.   Sleep apnea    Thyroid disease    hypothyroidism   Past Surgical History:  Procedure Laterality Date   BREAST SURGERY     lumpectomy- left-benign   CATARACT EXTRACTION, BILATERAL Bilateral    CHOLECYSTECTOMY     laparoscopic   COLECTOMY  10/20/2011   sigmoid colectomy   COLONOSCOPY  2018   DG  BONE  DENSITY ([place] HX)  2021   [name], PA; [facility]/ Per new patient form   ESOPHAGOGASTRODUODENOSCOPY     EYE SURGERY     bilateral cataracts   HERNIA REPAIR     INCISIONAL HERNIA REPAIR N/A 01/12/2013   Procedure: LAPAROSCOPIC/OPEN REPAIR INCISIONAL HERNIA;  Surgeon: [name], MD;  [facility];  Service: General;  Laterality: N/A;  With Mesh   INCISIONAL HERNIA REPAIR N/A 04/23/2015   Procedure: LAPAROSCOPIC ASSISTED INCISIONAL VENTRAL HERNIA REPAIR;  Surgeon: [name], MD;  [facility];  Service: General;  Laterality: N/A;   INSERTION OF MESH N/A 04/23/2015   Procedure: INSERTION OF MESH;  Surgeon: [name], MD;  [facility];  Service: General;  Laterality: N/A;   JOINT REPLACEMENT  05/2021   left hip   KYPHOPLASTY N/A 08/19/2021   Procedure: KYPHOPLASTY THORACIC TWELVE, LUMBAR ONE;  Surgeon: [name], MD;  Location: [facility];  Service: Neurosurgery;  Laterality: N/A;  KYPHOPLASTY THORACIC TWELVE, LUMBAR ONE   TOTAL HIP ARTHROPLASTY Left 2022   [facility], [place]; Per new patient form   TUBAL LIGATION  1972    Allergies  Allergen Reactions   Penicillins Anaphylaxis   Codeine     Makes pt overly excited.... Anxious/ hyperactive    Nsaids     Burns stomach     Allergies as of 08/04/2022       Reactions   Penicillins Anaphylaxis   Codeine    Makes pt overly excited.... Anxious/ hyperactive   Nsaids    Burns stomach         Medication List        Accurate as of August 04, 2022  3:28 PM. If you have any questions, ask your nurse or doctor.          STOP taking these medications    meclizine 12.5 MG tablet Commonly known as: ANTIVERT Stopped by: Yvonna Alanis, NP       TAKE these medications    albuterol 108 (90 Base) MCG/ACT inhaler Commonly known as: VENTOLIN HFA Inhale 2 puffs into the lungs every 6 (six) hours as needed for wheezing or shortness of breath.   amLODipine 5 MG  tablet Commonly known as: NORVASC TAKE 1 TABLET (5 MG TOTAL) BY MOUTH DAILY.   CALCIUM 600 + D PO Take 1 tablet by mouth daily.   celecoxib 100 MG capsule Commonly known as: CELEBREX Take 1 capsule (100 mg total) by mouth 2 (two) times daily as needed for moderate pain.   clonazePAM 0.5 MG tablet Commonly known as: KLONOPIN Take 0.5 mg by mouth 2 (two) times daily as needed for anxiety.   cyanocobalamin 100 MCG tablet Take 100 mcg by mouth daily.   DULoxetine 60 MG capsule Commonly known as: CYMBALTA Take 1 capsule (60  mg total) by mouth daily.   hyoscyamine 0.125 MG SL tablet Commonly known as: LEVSIN SL PLACE 1 TABLET (0.125 MG TOTAL) UNDER THE TONGUE 4 (FOUR) TIMES DAILY AS NEEDED FOR CRAMPING.   levothyroxine 50 MCG tablet Commonly known as: SYNTHROID Take 1 tablet (50 mcg total) by mouth daily before breakfast.   loratadine 10 MG tablet Commonly known as: CLARITIN Take 10 mg by mouth daily.   LUBRICANT EYE DROPS OP Apply 1 drop to eye as needed.   omeprazole 20 MG capsule Commonly known as: PRILOSEC Take 1 capsule (20 mg total) by mouth daily.   polyethylene glycol 17 g packet Commonly known as: MIRALAX / GLYCOLAX Take 17 g by mouth daily as needed.   PRESERVISION AREDS 2+MULTI VIT PO Take 1 capsule by mouth daily.   rosuvastatin 5 MG tablet Commonly known as: CRESTOR TAKE 1 TABLET (5 MG TOTAL) BY MOUTH DAILY.   triamterene-hydrochlorothiazide 37.5-25 MG capsule Commonly known as: DYAZIDE Take 1 each (1 capsule total) by mouth every morning.        Review of Systems  Constitutional:  Negative for chills, fatigue and fever.  Respiratory:  Negative for cough, shortness of breath and wheezing.   Cardiovascular:  Negative for chest pain and leg swelling.  Genitourinary:  Positive for dysuria and frequency. Negative for hematuria.  Psychiatric/Behavioral:  Positive for dysphoric mood. Negative for confusion. The patient is nervous/anxious.      Immunization History  Administered Date(s) Administered   Fluad Quad(high Dose 65+) 05/07/2022   Influenza, High Dose Seasonal PF 04/19/2019, 04/18/2020, 06/17/2021   Influenza-Unspecified 04/19/2019, 04/18/2020, 06/17/2021   Moderna Covid-19 Vaccine Bivalent Booster 70yr & up 08/16/2019, 09/12/2019, 06/29/2021   Moderna Sars-Covid-2 Vaccination 06/17/2020, 03/09/2021, 08/06/2021, 12/17/2021   Pneumococcal Conjugate-13 12/20/2013   Pneumococcal Polysaccharide-23 08/17/2012   Zoster Recombinat (Shingrix) 01/30/2014, 12/18/2020, 04/18/2021   Pertinent  Health Maintenance Due  Topic Date Due   INFLUENZA VACCINE  Completed   DEXA SCAN  Completed      08/19/2021    7:14 AM 10/12/2021    2:47 PM 07/19/2022    3:21 PM 07/22/2022    2:05 PM  Fall Risk  Falls in the past year?  '1 1 1  '$ Was there an injury with Fall?  0 1 1  Fall Risk Category Calculator  '2 3 3  '$ Fall Risk Category  Moderate High High  Patient Fall Risk Level High fall risk Moderate fall risk Moderate fall risk Moderate fall risk  Patient at Risk for Falls Due to  No Fall Risks History of fall(s) History of fall(s)  Fall risk Follow up  Falls evaluation completed Falls evaluation completed Falls evaluation completed   Functional Status Survey:    Vitals:   08/04/22 1516  BP: (!) 141/83  Pulse: 77  Resp: 18  Temp: (!) 97.5 F (36.4 C)  SpO2: 97%  Weight: 177 lb 9.6 oz (80.6 kg)  Height: '5\' 4"'$  (1.626 m)   Body mass index is 30.48 kg/m. Physical Exam Vitals reviewed.  Constitutional:      General: She is not in acute distress. HENT:     Head: Normocephalic.  Eyes:     General:        Right eye: No discharge.        Left eye: No discharge.  Cardiovascular:     Rate and Rhythm: Normal rate and regular rhythm.     Pulses: Normal pulses.     Heart sounds: Normal heart sounds.  Pulmonary:  Effort: Pulmonary effort is normal. No respiratory distress.     Breath sounds: Normal breath sounds. No  wheezing.  Abdominal:     General: Bowel sounds are normal. There is no distension.     Palpations: Abdomen is soft.     Tenderness: There is no abdominal tenderness. There is no right CVA tenderness or left CVA tenderness.  Musculoskeletal:     Cervical back: Neck supple.     Right lower leg: No edema.     Left lower leg: No edema.  Skin:    General: Skin is warm.     Capillary Refill: Capillary refill takes less than 2 seconds.  Neurological:     General: No focal deficit present.     Mental Status: She is alert and oriented to person, place, and time.     Motor: Weakness present.     Gait: Gait abnormal.     Comments: walker  Psychiatric:        Mood and Affect: Mood normal.        Behavior: Behavior normal.     Labs reviewed: Recent Labs    01/26/22 0000 04/14/22 0000  NA 133* 136*  K 4.7 4.7  CL 96* 97*  CO2 23* 26*  BUN 18  --   CREATININE 0.9 1.0  CALCIUM 9.3 9.2   Recent Labs    01/26/22 0000 04/14/22 0000  AST 16 15  ALT 13 16  ALKPHOS 93 94  ALBUMIN 4.5 4.3   Recent Labs    01/26/22 0000 04/14/22 0000  WBC 7.2 7.1  HGB 13.6 12.8  HCT 40 37  PLT 296 283   Lab Results  Component Value Date   TSH 1.15 01/26/2022   No results found for: "HGBA1C" Lab Results  Component Value Date   CHOL 191 01/26/2022   HDL 77 (A) 01/26/2022   LDLCALC 89 01/26/2022   TRIG 124 01/26/2022    Significant Diagnostic Results in last 30 days:  No results found.  Assessment/Plan 1. Dysuria - onset x 2 days, described as severe - h/o UTI in past> given Cipro> requesting script today - started AZO 01/02> urine back to normal - exam unremarkable - UA/culture - start Cipro 500 mg po BID x 5 days  2. Urinary frequency - see above  3. COVID-19 - tested positive 12/21 - off isolation - denies long term symptoms    Family/ staff Communication: plan discussed with patient and nurse  Labs/tests ordered:  UA/culture

## 2022-08-18 DIAGNOSIS — H353221 Exudative age-related macular degeneration, left eye, with active choroidal neovascularization: Secondary | ICD-10-CM | POA: Diagnosis not present

## 2022-08-18 DIAGNOSIS — H43813 Vitreous degeneration, bilateral: Secondary | ICD-10-CM | POA: Diagnosis not present

## 2022-08-18 DIAGNOSIS — H353112 Nonexudative age-related macular degeneration, right eye, intermediate dry stage: Secondary | ICD-10-CM | POA: Diagnosis not present

## 2022-08-23 ENCOUNTER — Other Ambulatory Visit: Payer: Self-pay | Admitting: Orthopedic Surgery

## 2022-08-23 DIAGNOSIS — F339 Major depressive disorder, recurrent, unspecified: Secondary | ICD-10-CM

## 2022-08-26 DIAGNOSIS — H353221 Exudative age-related macular degeneration, left eye, with active choroidal neovascularization: Secondary | ICD-10-CM | POA: Diagnosis not present

## 2022-09-23 DIAGNOSIS — Z6830 Body mass index (BMI) 30.0-30.9, adult: Secondary | ICD-10-CM | POA: Diagnosis not present

## 2022-09-23 DIAGNOSIS — M47816 Spondylosis without myelopathy or radiculopathy, lumbar region: Secondary | ICD-10-CM | POA: Diagnosis not present

## 2022-09-23 DIAGNOSIS — S32010D Wedge compression fracture of first lumbar vertebra, subsequent encounter for fracture with routine healing: Secondary | ICD-10-CM | POA: Diagnosis not present

## 2022-10-07 ENCOUNTER — Other Ambulatory Visit: Payer: Self-pay | Admitting: Orthopedic Surgery

## 2022-10-07 DIAGNOSIS — H43813 Vitreous degeneration, bilateral: Secondary | ICD-10-CM | POA: Diagnosis not present

## 2022-10-07 DIAGNOSIS — K219 Gastro-esophageal reflux disease without esophagitis: Secondary | ICD-10-CM

## 2022-10-07 DIAGNOSIS — H353132 Nonexudative age-related macular degeneration, bilateral, intermediate dry stage: Secondary | ICD-10-CM | POA: Diagnosis not present

## 2022-10-12 ENCOUNTER — Other Ambulatory Visit: Payer: Self-pay | Admitting: Orthopedic Surgery

## 2022-10-12 DIAGNOSIS — I10 Essential (primary) hypertension: Secondary | ICD-10-CM

## 2022-10-14 ENCOUNTER — Encounter: Payer: Self-pay | Admitting: Adult Health

## 2022-10-14 ENCOUNTER — Non-Acute Institutional Stay: Payer: Medicare HMO | Admitting: Adult Health

## 2022-10-14 DIAGNOSIS — N3000 Acute cystitis without hematuria: Secondary | ICD-10-CM | POA: Diagnosis not present

## 2022-10-14 DIAGNOSIS — N390 Urinary tract infection, site not specified: Secondary | ICD-10-CM | POA: Diagnosis not present

## 2022-10-14 MED ORDER — NITROFURANTOIN MONOHYD MACRO 100 MG PO CAPS: 100.0000 mg | ORAL_CAPSULE | Freq: Two times a day (BID) | ORAL | 0 refills | Status: AC

## 2022-10-14 NOTE — Progress Notes (Signed)
Location:  [place] Room Number: [place] of Service:  ALF [id number]) Provider:  Royal Hawthorn, NP   Patient Care Team: Virgie Dad, MD as PCP - General (Internal Medicine) [name], MD (Inactive) as Consulting Physician (General Surgery) Virgie Dad, MD as Consulting Physician (Internal Medicine) Brandy Hale, MD as Referring Physician (Obstetrics and Gynecology) Donato Heinz, MD as Consulting Physician (Cardiology)  Extended Emergency Contact Information Primary Emergency Contact: East,Teresa Address: [address]          [place], [place] [address] [place] Phone: [phone] Mobile Phone: [phone] Relation: Daughter Secondary Emergency Contact: Shane Crutch Address: [address]          [place], [place] [address] [place] of [place] Phone: [phone] Mobile Phone: [phone] Relation: Daughter  Code Status:  Full Code  Goals of care: Advanced Directive information    10/14/2022   12:43 PM  Advanced Directives  Does Patient Have a Medical Advance Directive? Yes  Type of Advance Directive [place]  Does patient want to make changes to medical advance directive? No - Patient declined  Copy of [place] in Chart? Yes - validated most recent copy scanned in chart (See row information)     Chief Complaint  Patient presents with   Acute Visit    Possible urinary tract infection     HPI:  Pt is a 88 y.o. female seen today for an acute visit for possible UTI Reports one week ago she started having burning with urination. She was not drinking enough water. Then took Azo and felt better. Over the weekend she had some left sided pain and dysuria again and took some left over cipro and more azo. UA sent out this am and is pending. Continues with dysuria. She is certain she has a UTI and [facility]. Had UTI  treatment with cipro in Jan. UA never ran due to no name on specimen.  Reports hx of recurrent UTIs years ago with no cause. Did see urology then. Now has one a year. No fever or chills or nausea. No blood in urine.    Past Medical History:  Diagnosis Date   Allergy    Anemia    Anginal pain ([place])    Anxiety    Arthritis    Atypical moles    Breast lump    left   Chronic kidney disease    Colon polyp    Depression    Diverticulitis    04-15-15 no recent issues   Dysrhythmia 01/2020   Edema    legs, hands  bilateral   GERD (gastroesophageal reflux disease)    hx of    Heart murmur 01/2020   High cholesterol    Per new patient form   Hypertension    Hypothyroidism    IBS (irritable bowel syndrome)    Per new patient form   Impaired hearing    bilaterally some hearing loss-no hearing aids   Lactose intolerance    Per new patient form   Macular degeneration    Per new patient form   Mild cognitive impairment with memory loss 08/02/2000   per Dr Berdine Addison, neuro, 04-15-15 no changes-"stable"   Recurrent upper respiratory infection (URI)    cold with sinus drainage x several months- saw PCP 10/08/11- chest x ray report on chart.  no fever,resolved, no recent issues as of 04-15-15.  Shoulder joint pain    04-07-15 "tripped-loss balance, fell awkwardly and "wrenched shoulder-left", Known "bad right shoulder"bone on bone"   Skin lesion of breast    hx of right lateral breast  lesion-tx. with multipe rounds oral antibiotic- resolved, no reoccurrence.   Sleep apnea    Thyroid disease    hypothyroidism   Past Surgical History:  Procedure Laterality Date   BREAST SURGERY     lumpectomy- left-benign   CATARACT EXTRACTION, BILATERAL Bilateral    CHOLECYSTECTOMY     laparoscopic   COLECTOMY  10/20/2011   sigmoid colectomy   COLONOSCOPY  2018   DG  BONE DENSITY ([place] HX)  2021   [name], PA; [facility]/ Per new patient form   ESOPHAGOGASTRODUODENOSCOPY     EYE SURGERY      bilateral cataracts   HERNIA REPAIR     INCISIONAL HERNIA REPAIR N/A 01/12/2013   Procedure: LAPAROSCOPIC/OPEN REPAIR INCISIONAL HERNIA;  Surgeon: [name], MD;  [facility]: [facility];  Service: General;  Laterality: N/A;  With Mesh   INCISIONAL HERNIA REPAIR N/A 04/23/2015   Procedure: LAPAROSCOPIC ASSISTED INCISIONAL VENTRAL HERNIA REPAIR;  Surgeon: [name], MD;  Location: [facility];  Service: General;  Laterality: N/A;   INSERTION OF MESH N/A 04/23/2015   Procedure: INSERTION OF MESH;  Surgeon: [name], MD;  Location: [facility];  Service: General;  Laterality: N/A;   JOINT REPLACEMENT  05/2021   left hip   KYPHOPLASTY N/A 08/19/2021   Procedure: KYPHOPLASTY THORACIC TWELVE, LUMBAR ONE;  Surgeon: [name], MD;  Location: [place];  Service: Neurosurgery;  Laterality: N/A;  KYPHOPLASTY THORACIC TWELVE, LUMBAR ONE   TOTAL HIP ARTHROPLASTY Left 2022   [facility] [place]; Per new patient form   TUBAL LIGATION  1972    Allergies  Allergen Reactions   Penicillins Anaphylaxis   Codeine     Makes pt overly excited.... Anxious/ hyperactive    Nsaids     Burns stomach     Outpatient Encounter Medications as of 10/14/2022  Medication Sig   albuterol (VENTOLIN HFA) 108 (90 Base) MCG/ACT inhaler Inhale 2 puffs into the lungs every 6 (six) hours as needed for wheezing or shortness of breath.   amLODipine (NORVASC) 5 MG tablet TAKE 1 TABLET (5 MG TOTAL) BY MOUTH DAILY.   Calcium Carb-Cholecalciferol (CALCIUM 600 + D PO) Take 1 tablet by mouth daily.   Carboxymethylcellulose Sodium (LUBRICANT EYE DROPS OP) Apply 1 drop to eye as needed.   celecoxib (CELEBREX) 100 MG capsule Take 1 capsule (100 mg total) by mouth 2 (two) times daily as needed for moderate pain.   clonazePAM (KLONOPIN) 0.5 MG tablet Take 0.5 mg by mouth 2 (two) times daily as needed for anxiety.   cyanocobalamin 100 MCG tablet Take 100 mcg by mouth daily.   DULoxetine (CYMBALTA) 60 MG  capsule TAKE 1 CAPSULE BY MOUTH EVERY DAY   hyoscyamine (LEVSIN SL) 0.125 MG SL tablet PLACE 1 TABLET (0.125 MG TOTAL) UNDER THE TONGUE 4 (FOUR) TIMES DAILY AS NEEDED FOR CRAMPING.   levothyroxine (SYNTHROID) 50 MCG tablet Take 1 tablet (50 mcg total) by mouth daily before breakfast.   loratadine (CLARITIN) 10 MG tablet Take 10 mg by mouth daily.   Multiple Vitamins-Minerals (PRESERVISION AREDS 2+MULTI VIT PO) Take 1 capsule by mouth daily.   omeprazole (PRILOSEC) 20 MG capsule TAKE 1 CAPSULE BY MOUTH EVERY DAY   polyethylene glycol (MIRALAX / GLYCOLAX) 17 g packet Take 17 g by mouth  daily as needed.   rosuvastatin (CRESTOR) 5 MG tablet TAKE 1 TABLET (5 MG TOTAL) BY MOUTH DAILY.   triamterene-hydrochlorothiazide (DYAZIDE) 37.5-25 MG capsule TAKE 1 EACH (1 CAPSULE TOTAL) BY MOUTH EVERY MORNING.   No facility-administered encounter medications on file as of 10/14/2022.    Review of Systems  Constitutional:  Negative for activity change, appetite change, chills, diaphoresis, fatigue, fever and unexpected weight change.  HENT:  Negative for congestion.   Respiratory:  Negative for cough, shortness of breath and wheezing.   Cardiovascular:  Negative for chest pain, palpitations and leg swelling.  Gastrointestinal:  Negative for abdominal distention, abdominal pain, constipation and diarrhea.  Genitourinary:  Positive for dysuria and flank pain. Negative for difficulty urinating, frequency, hematuria, menstrual problem, pelvic pain and urgency.  Musculoskeletal:  Positive for back pain and gait problem. Negative for arthralgias, joint swelling and myalgias.  Neurological:  Negative for dizziness, tremors, seizures, syncope, facial asymmetry, speech difficulty, weakness, light-headedness, numbness and headaches.  Psychiatric/Behavioral:  Negative for agitation, behavioral problems and confusion.     Immunization History  Administered Date(s) Administered   Fluad Quad(high Dose 65+) 05/07/2022    Influenza, High Dose Seasonal PF 04/19/2019, 04/18/2020, 06/17/2021   Influenza-Unspecified 04/19/2019, 04/18/2020, 06/17/2021   Moderna Covid-19 Vaccine Bivalent Booster 20yr & up 08/16/2019, 09/12/2019, 06/29/2021   Moderna Sars-Covid-2 Vaccination 06/17/2020, 03/09/2021, 08/06/2021, 12/17/2021   Pneumococcal Conjugate-13 12/20/2013   Pneumococcal Polysaccharide-23 08/17/2012   Zoster Recombinat (Shingrix) 01/30/2014, 12/18/2020, 04/18/2021   Pertinent  Health Maintenance Due  Topic Date Due   INFLUENZA VACCINE  Completed   DEXA SCAN  Completed      08/19/2021    7:14 AM 10/12/2021    2:47 PM 07/19/2022    3:21 PM 07/22/2022    2:05 PM  Fall Risk  Falls in the past year?  '1 1 1  '$ Was there an injury with Fall?  0 1 1  Fall Risk Category Calculator  '2 3 3  '$ Fall Risk Category (Retired)  Moderate High High  (RETIRED) Patient Fall Risk Level High fall risk Moderate fall risk Moderate fall risk Moderate fall risk  Patient at Risk for Falls Due to  No Fall Risks History of fall(s) History of fall(s)  Fall risk Follow up  Falls evaluation completed Falls evaluation completed Falls evaluation completed   Functional Status Survey:    Vitals:   10/14/22 1241  BP: (!) 152/94  Pulse: 82  Temp: 97.7 F (36.5 C)  SpO2: 98%  Weight: 177 lb (80.3 kg)  Height: '5\' 4"'$  (1.626 m)   Body mass index is 30.38 kg/m. Physical Exam Vitals and nursing note reviewed.  Constitutional:      General: She is not in acute distress.    Appearance: Normal appearance. She is not diaphoretic.  HENT:     Head: Normocephalic and atraumatic.  Neck:     Vascular: No JVD.  Cardiovascular:     Rate and Rhythm: Normal rate and regular rhythm.     Heart sounds: Murmur heard.  Pulmonary:     Effort: Pulmonary effort is normal. No respiratory distress.     Breath sounds: Normal breath sounds. No wheezing.  Abdominal:     General: Bowel sounds are normal. There is no distension.     Palpations: Abdomen is  soft. There is no mass.     Tenderness: There is abdominal tenderness (RLQ slight). There is no right CVA tenderness or left CVA tenderness.     Hernia: No hernia is  present.  Skin:    General: Skin is warm and dry.  Neurological:     Mental Status: She is alert and oriented to person, place, and time.     Labs reviewed: Recent Labs    01/26/22 0000 04/14/22 0000  NA 133* 136*  K 4.7 4.7  CL 96* 97*  CO2 23* 26*  BUN 18  --   CREATININE 0.9 1.0  CALCIUM 9.3 9.2   Recent Labs    01/26/22 0000 04/14/22 0000  AST 16 15  ALT 13 16  ALKPHOS 93 94  ALBUMIN 4.5 4.3   Recent Labs    01/26/22 0000 04/14/22 0000  WBC 7.2 7.1  HGB 13.6 12.8  HCT 40 37  PLT 296 283   Lab Results  Component Value Date   TSH 1.15 01/26/2022   No results found for: "HGBA1C" Lab Results  Component Value Date   CHOL 191 01/26/2022   HDL 77 (A) 01/26/2022   LDLCALC 89 01/26/2022   TRIG 124 01/26/2022    Significant Diagnostic Results in last 30 days:  No results found.  Assessment/Plan  1. Acute cystitis without hematuria Dysuria, very uncomfortable.  UA pending [facility] and family has to pick up to start Has severe PCN allergy and just had cipro. She is not sure about taking sulfa products.  - nitrofurantoin, macrocrystal-monohydrate, (MACROBID) 100 MG capsule; Take 1 capsule (100 mg total) by mouth 2 (two) times daily for 7 days.  Dispense: 14 capsule; Refill: 0  Consider urology referral if this occurs again.    Family/ staff Communication: resident   Labs/tests ordered:  UA C and S pending

## 2022-10-18 ENCOUNTER — Other Ambulatory Visit: Payer: Self-pay | Admitting: Adult Health

## 2022-10-18 DIAGNOSIS — N390 Urinary tract infection, site not specified: Secondary | ICD-10-CM | POA: Diagnosis not present

## 2022-10-18 MED ORDER — CIPROFLOXACIN HCL 500 MG PO TABS: 500.0000 mg | ORAL_TABLET | Freq: Two times a day (BID) | ORAL | 0 refills | Status: AC

## 2022-10-18 NOTE — Progress Notes (Signed)
Pt states macrobid did not help after taking her own supply of cipro for two days. Still having dysuria. She is requesting cipro. Urine recollected. Cipro ordered. Urology referral made for recurrent infection. Has severe PCN allergy

## 2022-10-28 DIAGNOSIS — L814 Other melanin hyperpigmentation: Secondary | ICD-10-CM | POA: Diagnosis not present

## 2022-10-28 DIAGNOSIS — D1801 Hemangioma of skin and subcutaneous tissue: Secondary | ICD-10-CM | POA: Diagnosis not present

## 2022-10-28 DIAGNOSIS — L821 Other seborrheic keratosis: Secondary | ICD-10-CM | POA: Diagnosis not present

## 2022-10-28 DIAGNOSIS — L570 Actinic keratosis: Secondary | ICD-10-CM | POA: Diagnosis not present

## 2022-11-23 ENCOUNTER — Encounter: Payer: Self-pay | Admitting: Internal Medicine

## 2022-11-23 ENCOUNTER — Non-Acute Institutional Stay: Payer: Medicare HMO | Admitting: Internal Medicine

## 2022-11-23 VITALS — BP 142/80 | HR 86 | Temp 98.0°F | Resp 16 | Ht 62.0 in | Wt 159.8 lb

## 2022-11-23 DIAGNOSIS — M545 Low back pain, unspecified: Secondary | ICD-10-CM

## 2022-11-23 DIAGNOSIS — R42 Dizziness and giddiness: Secondary | ICD-10-CM | POA: Diagnosis not present

## 2022-11-23 DIAGNOSIS — E7800 Pure hypercholesterolemia, unspecified: Secondary | ICD-10-CM | POA: Diagnosis not present

## 2022-11-23 DIAGNOSIS — F4321 Adjustment disorder with depressed mood: Secondary | ICD-10-CM

## 2022-11-23 DIAGNOSIS — G4730 Sleep apnea, unspecified: Secondary | ICD-10-CM | POA: Diagnosis not present

## 2022-11-23 DIAGNOSIS — G5602 Carpal tunnel syndrome, left upper limb: Secondary | ICD-10-CM

## 2022-11-23 DIAGNOSIS — R350 Frequency of micturition: Secondary | ICD-10-CM | POA: Diagnosis not present

## 2022-11-23 DIAGNOSIS — Z79899 Other long term (current) drug therapy: Secondary | ICD-10-CM | POA: Diagnosis not present

## 2022-11-23 DIAGNOSIS — E782 Mixed hyperlipidemia: Secondary | ICD-10-CM | POA: Diagnosis not present

## 2022-11-23 DIAGNOSIS — G8929 Other chronic pain: Secondary | ICD-10-CM

## 2022-11-23 DIAGNOSIS — E039 Hypothyroidism, unspecified: Secondary | ICD-10-CM

## 2022-11-23 DIAGNOSIS — I10 Essential (primary) hypertension: Secondary | ICD-10-CM

## 2022-11-23 LAB — HEPATIC FUNCTION PANEL
ALT: 17 U/L (ref 7–35)
AST: 20 (ref 13–35)
Alkaline Phosphatase: 95 (ref 25–125)

## 2022-11-23 LAB — BASIC METABOLIC PANEL
BUN: 15 (ref 4–21)
CO2: 25 — AB (ref 13–22)
Chloride: 98 — AB (ref 99–108)
Creatinine: 0.8 (ref 0.5–1.1)
Glucose: 113
Potassium: 4.5 mEq/L (ref 3.5–5.1)
Sodium: 136 — AB (ref 137–147)

## 2022-11-23 LAB — TSH: TSH: 2.09 (ref 0.41–5.90)

## 2022-11-23 LAB — COMPREHENSIVE METABOLIC PANEL
Albumin: 4.6 (ref 3.5–5.0)
Calcium: 9.3 (ref 8.7–10.7)

## 2022-11-23 MED ORDER — METHOCARBAMOL 500 MG PO TABS: 250.0000 mg | ORAL_TABLET | Freq: Two times a day (BID) | ORAL | 0 refills | Status: DC | PRN

## 2022-11-23 NOTE — Progress Notes (Signed)
Location:  [facility] features editor of Service:  Clinic (12)  Provider:   Code Status:  Goals of Care:     10/14/2022   12:43 PM  Advanced Directives  Does Patient Have a Medical Advance Directive? Yes  Type of Advance Directive Healthcare Power of Attorney  Does patient want to make changes to medical advance directive? No - Patient declined  Copy of Healthcare Power of Attorney in Chart? Yes - validated most recent copy scanned in chart (See row information)     Chief Complaint  Patient presents with   Medical Management of Chronic Issues    4 month follow up   Health Maintenance    Discuss the need for Tetanus and AWV    HPI: Patient is a 88 y.o. female seen today for medical management of chronic diseases.   Acute issues  Back Pain S/p Redio frequency procedure She says initially improved but symptoms back Neurosurgery recommended Tramadol She wants to know if she can try something else at night especially  Already on Tylenol and Patches and Celebrex  Right hip pain  Frequency and Cystitis Was put on Macrobid initially Continued with Dysuria And then was treated with Cipro also referred to urology Patient felt that with Cipro her symptoms improved and does not want to go and see urologist right now Culture is in matrix are both negative for any growth.  UA is positive for WBC Left Hand Carpal Tunnel Plan to have surgery next month Depression Continues to struggle with this On Cymbalta.  Does not want me to do anything else Sleep apnea Had study done 5 years ago.  She states her mask does not fit her anymore.  [place] continues to be very active cognitively doing very well Past Medical History:  Diagnosis Date   Allergy    Anemia    Anginal pain    Anxiety    Arthritis    Atypical moles    Breast lump    left   Chronic kidney disease    Colon polyp    Depression    Diverticulitis    04-15-15 no recent issues    Dysrhythmia 01/2020   Edema    legs, hands  bilateral   GERD (gastroesophageal reflux disease)    hx of    Heart murmur 01/2020   High cholesterol    Per new patient form   Hypertension    Hypothyroidism    IBS (irritable bowel syndrome)    Per new patient form   Impaired hearing    bilaterally some hearing loss-no hearing aids   Lactose intolerance    Per new patient form   Macular degeneration    Per new patient form   Mild cognitive impairment with memory loss 08/02/2000   per Dr Loleta Chance, neuro, 04-15-15 no changes-"stable"   Recurrent upper respiratory infection (URI)    cold with sinus drainage x several months- saw PCP 10/08/11- chest x ray report on chart.  no fever,resolved, no recent issues as of 04-15-15.   Shoulder joint pain    04-07-15 "tripped-loss balance, fell awkwardly and "wrenched shoulder-left", Known "bad right shoulder"bone on bone"   Skin lesion of breast    hx of right lateral breast  lesion-tx. with multipe rounds oral antibiotic- resolved, no reoccurrence.   Sleep apnea    Thyroid disease    hypothyroidism    Past Surgical History:  Procedure Laterality Date   BREAST SURGERY  lumpectomy- left-benign   CATARACT EXTRACTION, BILATERAL Bilateral    CHOLECYSTECTOMY     laparoscopic   COLECTOMY  10/20/2011   sigmoid colectomy   COLONOSCOPY  2018   DG  BONE DENSITY ([facility] HX)  2021   Coralie Common, PA; [facility]/ Per new patient form   ESOPHAGOGASTRODUODENOSCOPY     EYE SURGERY     bilateral cataracts   HERNIA REPAIR     INCISIONAL HERNIA REPAIR N/A 01/12/2013   Procedure: LAPAROSCOPIC/OPEN REPAIR INCISIONAL HERNIA;  Surgeon: Mariella Saa, MD;  [facility]: [facility];  Service: General;  Laterality: N/A;  With Mesh   INCISIONAL HERNIA REPAIR N/A 04/23/2015   Procedure: LAPAROSCOPIC ASSISTED INCISIONAL VENTRAL HERNIA REPAIR;  Surgeon: Glenna Fellows, MD;  [facility];  Service: General;  Laterality: N/A;   INSERTION OF MESH N/A 04/23/2015    Procedure: INSERTION OF MESH;  Surgeon: Glenna Fellows, MD;  [facility];  Service: General;  Laterality: N/A;   JOINT REPLACEMENT  05/2021   left hip   KYPHOPLASTY N/A 08/19/2021   Procedure: KYPHOPLASTY THORACIC TWELVE, LUMBAR ONE;  Surgeon: Tressie Stalker, MD;  Location: [facility];  Service: Neurosurgery;  Laterality: N/A;  KYPHOPLASTY THORACIC TWELVE, LUMBAR ONE   TOTAL HIP ARTHROPLASTY Left 2022   [facility]; Per new patient form   TUBAL LIGATION  1972    Allergies  Allergen Reactions   Penicillins Anaphylaxis   Codeine     Makes pt overly excited.... Anxious/ hyperactive    Nsaids     Burns stomach     Outpatient Encounter Medications as of 11/23/2022  Medication Sig   albuterol (VENTOLIN HFA) 108 (90 Base) MCG/ACT inhaler Inhale 2 puffs into the lungs every 6 (six) hours as needed for wheezing or shortness of breath.   amLODipine (NORVASC) 5 MG tablet TAKE 1 TABLET (5 MG TOTAL) BY MOUTH DAILY.   Calcium Carb-Cholecalciferol (CALCIUM 600 + D PO) Take 1 tablet by mouth daily.   Carboxymethylcellulose Sodium (LUBRICANT EYE DROPS OP) Apply 1 drop to eye as needed.   celecoxib (CELEBREX) 100 MG capsule Take 1 capsule (100 mg total) by mouth 2 (two) times daily as needed for moderate pain.   clonazePAM (KLONOPIN) 0.5 MG tablet Take 0.5 mg by mouth 2 (two) times daily as needed for anxiety.   cyanocobalamin 100 MCG tablet Take 100 mcg by mouth daily.   DULoxetine (CYMBALTA) 60 MG capsule TAKE 1 CAPSULE BY MOUTH EVERY DAY   hyoscyamine (LEVSIN SL) 0.125 MG SL tablet PLACE 1 TABLET (0.125 MG TOTAL) UNDER THE TONGUE 4 (FOUR) TIMES DAILY AS NEEDED FOR CRAMPING.   levothyroxine (SYNTHROID) 50 MCG tablet Take 1 tablet (50 mcg total) by mouth daily before breakfast.   loratadine (CLARITIN) 10 MG tablet Take 10 mg by mouth daily.   Multiple Vitamins-Minerals (PRESERVISION AREDS 2+MULTI VIT PO) Take 1 capsule by mouth daily.   omeprazole (PRILOSEC) 20 MG  capsule TAKE 1 CAPSULE BY MOUTH EVERY DAY   polyethylene glycol (MIRALAX / GLYCOLAX) 17 g packet Take 17 g by mouth daily as needed.   rosuvastatin (CRESTOR) 5 MG tablet TAKE 1 TABLET (5 MG TOTAL) BY MOUTH DAILY.   triamterene-hydrochlorothiazide (DYAZIDE) 37.5-25 MG capsule TAKE 1 EACH (1 CAPSULE TOTAL) BY MOUTH EVERY MORNING.   No facility-administered encounter medications on file as of 11/23/2022.    Review of Systems:  Review of Systems  Constitutional:  Negative for activity change and appetite change.  HENT: Negative.    Respiratory:  Negative for cough and shortness of breath.   Cardiovascular:  Negative for leg swelling.  Gastrointestinal:  Negative for constipation.  Genitourinary:  Positive for frequency.  Musculoskeletal:  Positive for back pain and gait problem. Negative for arthralgias and myalgias.  Skin: Negative.   Neurological:  Negative for dizziness and weakness.  Psychiatric/Behavioral:  Positive for sleep disturbance. Negative for confusion and dysphoric mood. The patient is nervous/anxious.     Health Maintenance  Topic Date Due   DTaP/Tdap/Td (1 - Tdap) Never done   Medicare Annual Wellness (AWV)  09/24/2021   COVID-19 Vaccine (8 - 2023-24 season) 12/08/2022 (Originally 04/02/2022)   INFLUENZA VACCINE  03/03/2023   Pneumonia Vaccine 23+ Years old  Completed   DEXA SCAN  Completed   Zoster Vaccines- Shingrix  Completed   HPV VACCINES  Aged Out    Physical Exam: Vitals:   11/23/22 1042 11/23/22 1043  BP: (!) 148/82 (!) 142/80  Pulse: 86   Resp: 16   Temp: 98 F (36.7 C)   TempSrc: Temporal   SpO2: 98%   Weight: 159 lb 12.8 oz (72.5 kg)   Height: 5\' 2"  (1.575 m)    Body mass index is 29.23 kg/m. Physical Exam Vitals reviewed.  Constitutional:      Appearance: Normal appearance.  HENT:     Head: Normocephalic.     Right Ear: Tympanic membrane normal.     Left Ear: Tympanic membrane normal.     Nose: Nose normal.     Mouth/Throat:     Mouth:  Mucous membranes are moist.     Pharynx: Oropharynx is clear.  Eyes:     Pupils: Pupils are equal, round, and reactive to light.  Cardiovascular:     Rate and Rhythm: Normal rate and regular rhythm.     Pulses: Normal pulses.     Heart sounds: Normal heart sounds. No murmur heard. Pulmonary:     Effort: Pulmonary effort is normal.     Breath sounds: Normal breath sounds.  Abdominal:     General: Abdomen is flat. Bowel sounds are normal.     Palpations: Abdomen is soft.  Musculoskeletal:        General: No swelling.     Cervical back: Neck supple.  Skin:    General: Skin is warm.  Neurological:     General: No focal deficit present.     Mental Status: She is alert and oriented to person, place, and time.  Psychiatric:        Mood and Affect: Mood normal.        Thought Content: Thought content normal.     Labs reviewed: Basic Metabolic Panel: Recent Labs    01/26/22 0000 04/14/22 0000  NA 133* 136*  K 4.7 4.7  CL 96* 97*  CO2 23* 26*  BUN 18  --   CREATININE 0.9 1.0  CALCIUM 9.3 9.2  TSH 1.15  --    Liver Function Tests: Recent Labs    01/26/22 0000 04/14/22 0000  AST 16 15  ALT 13 16  ALKPHOS 93 94  ALBUMIN 4.5 4.3   No results for input(s): "LIPASE", "AMYLASE" in the last 8760 hours. No results for input(s): "AMMONIA" in the last 8760 hours. CBC: Recent Labs    01/26/22 0000 04/14/22 0000  WBC 7.2 7.1  HGB 13.6 12.8  HCT 40 37  PLT 296 283   Lipid Panel: Recent Labs    01/26/22 0000  CHOL 191  HDL 77*  LDLCALC 89  TRIG 124   No results found for: "HGBA1C"  Procedures since last visit: No results found.  Assessment/Plan 1. Sleep apnea, unspecified type Had study done 5 years ago but now the mask doesn't work Needs New study and referral - Ambulatory referral to Pulmonology  2. Chronic bilateral low back pain without sciatica Tylenol , Celebrex and now tramadol per Neurosurgery She wants to not take Tramadol as it is Controlled  substance and wants to try something else We will try Robaxin 250 mg BID PRN 3. Carpal tunnel syndrome of left wrist Waiting for surgery next month   4. Hypothyroidism, unspecified type  TSH normal today 5. Pure hypercholesterolemia On statin LDL 83 in todays lab  6. Situational depression On Cymbalta and Klonopin   7. Urinary frequency and recurrent cystitis Her symptoms are better on Cipro She will hold off Urology for now  8. Essential hypertension Dyazide  9. Vertigo Symptoms resolved    Labs/tests ordered:  * No order type specified * Next appt:  Visit date not found

## 2022-11-25 DIAGNOSIS — M47816 Spondylosis without myelopathy or radiculopathy, lumbar region: Secondary | ICD-10-CM | POA: Diagnosis not present

## 2022-11-25 DIAGNOSIS — G894 Chronic pain syndrome: Secondary | ICD-10-CM | POA: Diagnosis not present

## 2022-11-25 DIAGNOSIS — M25551 Pain in right hip: Secondary | ICD-10-CM | POA: Diagnosis not present

## 2022-11-25 DIAGNOSIS — Z6830 Body mass index (BMI) 30.0-30.9, adult: Secondary | ICD-10-CM | POA: Diagnosis not present

## 2022-12-13 ENCOUNTER — Other Ambulatory Visit: Payer: Self-pay | Admitting: Orthopedic Surgery

## 2022-12-13 ENCOUNTER — Other Ambulatory Visit: Payer: Self-pay | Admitting: Adult Health

## 2022-12-13 DIAGNOSIS — S32010D Wedge compression fracture of first lumbar vertebra, subsequent encounter for fracture with routine healing: Secondary | ICD-10-CM

## 2022-12-14 ENCOUNTER — Telehealth: Payer: Self-pay

## 2022-12-14 DIAGNOSIS — S32010A Wedge compression fracture of first lumbar vertebra, initial encounter for closed fracture: Secondary | ICD-10-CM | POA: Diagnosis not present

## 2022-12-14 DIAGNOSIS — M47816 Spondylosis without myelopathy or radiculopathy, lumbar region: Secondary | ICD-10-CM | POA: Diagnosis not present

## 2022-12-14 NOTE — Telephone Encounter (Signed)
Patient is requesting a refill of the following medications: Requested Prescriptions   Pending Prescriptions Disp Refills   clonazePAM (KLONOPIN) 0.5 MG tablet [Pharmacy Med Name: CLONAZEPAM 0.5 MG TABLET] 60 tablet 2    Sig: TAKE 1 TABLET BY MOUTH 2 TIMES DAILY AS NEEDED FOR ANXIETY.    Date of last refill: Unknown  Refill amount: Unknown  Treatment agreement date: Not on file/Has a upcoming appointment

## 2022-12-14 NOTE — Telephone Encounter (Signed)
Patient canceled her CTR surgery scheduled for 12/16/22 due to recent fall. Pt currently in wheelchair & states she hurt her back and hip.

## 2022-12-14 NOTE — Telephone Encounter (Signed)
High Risk Warning Populated when attempting to refill, I will send to Provider for further review 

## 2022-12-17 DIAGNOSIS — M5416 Radiculopathy, lumbar region: Secondary | ICD-10-CM | POA: Diagnosis not present

## 2022-12-20 ENCOUNTER — Other Ambulatory Visit (INDEPENDENT_AMBULATORY_CARE_PROVIDER_SITE_OTHER): Payer: Medicare HMO

## 2022-12-20 ENCOUNTER — Ambulatory Visit: Payer: Medicare HMO | Admitting: Orthopaedic Surgery

## 2022-12-20 DIAGNOSIS — Z96642 Presence of left artificial hip joint: Secondary | ICD-10-CM

## 2022-12-20 DIAGNOSIS — M25551 Pain in right hip: Secondary | ICD-10-CM

## 2022-12-20 DIAGNOSIS — M7062 Trochanteric bursitis, left hip: Secondary | ICD-10-CM | POA: Diagnosis not present

## 2022-12-20 MED ORDER — LIDOCAINE HCL 1 % IJ SOLN
3.0000 mL | INTRAMUSCULAR | Status: AC | PRN
  Administered 2022-12-20: 3 mL

## 2022-12-20 MED ORDER — METHYLPREDNISOLONE ACETATE 40 MG/ML IJ SUSP
40.0000 mg | INTRAMUSCULAR | Status: AC | PRN
  Administered 2022-12-20: 40 mg via INTRA_ARTICULAR

## 2022-12-20 NOTE — Progress Notes (Signed)
The patient comes in today with left hip pain after a hard mechanical fall just a few weeks ago onto her left hip.  She is 88 years old.  She does have a history of a left total hip arthroplasty that was done elsewhere through an anterior approach.  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  Recently she did have an injection in her back from her pain specialist.  She still reports some pain over the lateral aspect of her left hip.  She denies any groin pain on either hip.  Both hips move smoothly and fluidly with no pain in the groin at all.  Her right hip exam is entirely benign and normal.  Her left hip hurts only over the trochanteric area of the hip.  An AP pelvis and lateral of both hips were obtained and show no evidence of a fracture.  The left hip replacement is in place.  I do feel that she likely has a contusion of the bone and a component of tendinitis and bursitis.  I did recommend a steroid injection over the left hip trochanteric area which she agreed to and tolerated well.  Will see her back in 4 weeks to see how she is doing with her hip but also then we can discuss proceeding at some point with carpal tunnel surgery.  She has had nerve studies by Dr. Alvester Morin before.    Procedure Note  Patient: Sophia Nash             Date of Birth: 1934/12/05           MRN: [id number]             Visit Date: 12/20/2022  Procedures: Visit Diagnoses:  1. Pain of right hip   2. History of left hip replacement   3. Trochanteric bursitis, left hip     Large Joint Inj: L greater trochanter on 12/20/2022 3:01 PM Indications: pain and diagnostic evaluation Details: 22 G 1.5 in needle, lateral approach  Arthrogram: No  Medications: 3 mL lidocaine 1 %; 40 mg methylPREDNISolone acetate 40 MG/ML Outcome: tolerated well, no immediate complications Procedure, treatment alternatives, risks and benefits explained, specific risks discussed. Consent was given by the patient.  Immediately prior to procedure a time out was called to verify the correct patient, procedure, equipment, support staff and site/side marked as required. Patient was prepped and draped in the usual sterile fashion.

## 2022-12-30 ENCOUNTER — Encounter: Payer: Medicare HMO | Admitting: Orthopaedic Surgery

## 2023-01-31 ENCOUNTER — Encounter: Payer: Self-pay | Admitting: Adult Health

## 2023-01-31 ENCOUNTER — Non-Acute Institutional Stay (INDEPENDENT_AMBULATORY_CARE_PROVIDER_SITE_OTHER): Payer: Medicare HMO | Admitting: Adult Health

## 2023-01-31 VITALS — BP 140/82 | HR 89 | Temp 98.1°F | Resp 18 | Ht 62.0 in | Wt 175.0 lb

## 2023-01-31 DIAGNOSIS — Z0000 Encounter for general adult medical examination without abnormal findings: Secondary | ICD-10-CM

## 2023-01-31 DIAGNOSIS — E039 Hypothyroidism, unspecified: Secondary | ICD-10-CM

## 2023-01-31 DIAGNOSIS — I10 Essential (primary) hypertension: Secondary | ICD-10-CM | POA: Diagnosis not present

## 2023-01-31 DIAGNOSIS — F339 Major depressive disorder, recurrent, unspecified: Secondary | ICD-10-CM | POA: Diagnosis not present

## 2023-01-31 DIAGNOSIS — E7800 Pure hypercholesterolemia, unspecified: Secondary | ICD-10-CM

## 2023-01-31 MED ORDER — AMLODIPINE BESYLATE 5 MG PO TABS: 5.0000 mg | ORAL_TABLET | Freq: Every day | ORAL | 1 refills | Status: DC

## 2023-01-31 MED ORDER — ROSUVASTATIN CALCIUM 5 MG PO TABS: 5.0000 mg | ORAL_TABLET | Freq: Every day | ORAL | 1 refills | Status: DC

## 2023-01-31 MED ORDER — LEVOTHYROXINE SODIUM 50 MCG PO TABS: 50.0000 ug | ORAL_TABLET | Freq: Every day | ORAL | 1 refills | Status: DC

## 2023-01-31 MED ORDER — DULOXETINE HCL 60 MG PO CPEP: 60.0000 mg | ORAL_CAPSULE | Freq: Every day | ORAL | 1 refills | Status: DC

## 2023-01-31 NOTE — Patient Instructions (Signed)
Sophia Nash , Thank you for taking time to come for your Medicare Wellness Visit. I appreciate your ongoing commitment to your health goals. Please review the following plan we discussed and let me know if I can assist you in the future.   Screening recommendations/referrals: Colonoscopy up to date Mammogram done 04/2022 Bone Density done 04/2022 Recommended yearly ophthalmology/optometry visit for glaucoma screening and checkup Recommended yearly dental visit for hygiene and checkup  Vaccinations: Influenza vaccine- due annually in September/October Pneumococcal vaccine up to date Tdap vaccine up to date Shingles vaccine up to date    Advanced directives: reviewed  Conditions/risks identified: fall risk   Next appointment: 1 year   Preventive Care 22 Years and Older, Female Preventive care refers to lifestyle choices and visits with your health care provider that can promote health and wellness. What does preventive care include? A yearly physical exam. This is also called an annual well check. Dental exams once or twice a year. Routine eye exams. Ask your health care provider how often you should have your eyes checked. Personal lifestyle choices, including: Daily care of your teeth and gums. Regular physical activity. Eating a healthy diet. Avoiding tobacco and drug use. Limiting alcohol use. Practicing safe sex. Taking low-dose aspirin every day. Taking vitamin and mineral supplements as recommended by your health care provider. What happens during an annual well check? The services and screenings done by your health care provider during your annual well check will depend on your age, overall health, lifestyle risk factors, and family history of disease. Counseling  Your health care provider may ask you questions about your: Alcohol use. Tobacco use. Drug use. Emotional well-being. Home and relationship well-being. Sexual activity. Eating habits. History of  falls. Memory and ability to understand (cognition). Work and work Astronomer. Reproductive health. Screening  You may have the following tests or measurements: Height, weight, and BMI. Blood pressure. Lipid and cholesterol levels. These may be checked every 5 years, or more frequently if you are over 31 years old. Skin check. Lung cancer screening. You may have this screening every year starting at age 15 if you have a 30-pack-year history of smoking and currently smoke or have quit within the past 15 years. Fecal occult blood test (FOBT) of the stool. You may have this test every year starting at age 49. Flexible sigmoidoscopy or colonoscopy. You may have a sigmoidoscopy every 5 years or a colonoscopy every 10 years starting at age 38. Hepatitis C blood test. Hepatitis B blood test. Sexually transmitted disease (STD) testing. Diabetes screening. This is done by checking your blood sugar (glucose) after you have not eaten for a while (fasting). You may have this done every 1-3 years. Bone density scan. This is done to screen for osteoporosis. You may have this done starting at age 52. Mammogram. This may be done every 1-2 years. Talk to your health care provider about how often you should have regular mammograms. Talk with your health care provider about your test results, treatment options, and if necessary, the need for more tests. Vaccines  Your health care provider may recommend certain vaccines, such as: Influenza vaccine. This is recommended every year. Tetanus, diphtheria, and acellular pertussis (Tdap, Td) vaccine. You may need a Td booster every 10 years. Zoster vaccine. You may need this after age 63. Pneumococcal 13-valent conjugate (PCV13) vaccine. One dose is recommended after age 40. Pneumococcal polysaccharide (PPSV23) vaccine. One dose is recommended after age 20. Talk to your health care provider about  which screenings and vaccines you need and how often you need  them. This information is not intended to replace advice given to you by your health care provider. Make sure you discuss any questions you have with your health care provider. Document Released: 08/15/2015 Document Revised: 04/07/2016 Document Reviewed: 05/20/2015 Elsevier Interactive Patient Education  2017 [employer] Prevention in the Home Falls can cause injuries. They can happen to people of all ages. There are many things you can do to make your home safe and to help prevent falls. What can I do on the outside of my home? Regularly fix the edges of walkways and driveways and fix any cracks. Remove anything that might make you trip as you walk through a door, such as a raised step or threshold. Trim any bushes or trees on the path to your home. Use bright outdoor lighting. Clear any walking paths of anything that might make someone trip, such as rocks or tools. Regularly check to see if handrails are loose or broken. Make sure that both sides of any steps have handrails. Any raised decks and porches should have guardrails on the edges. Have any leaves, snow, or ice cleared regularly. Use sand or salt on walking paths during winter. Clean up any spills in your garage right away. This includes oil or grease spills. What can I do in the bathroom? Use night lights. Install grab bars by the toilet and in the tub and shower. Do not use towel bars as grab bars. Use non-skid mats or decals in the tub or shower. If you need to sit down in the shower, use a plastic, non-slip stool. Keep the floor dry. Clean up any water that spills on the floor as soon as it happens. Remove soap buildup in the tub or shower regularly. Attach bath mats securely with double-sided non-slip rug tape. Do not have throw rugs and other things on the floor that can make you trip. What can I do in the bedroom? Use night lights. Make sure that you have a light by your bed that is easy to reach. Do not use  any sheets or blankets that are too big for your bed. They should not hang down onto the floor. Have a firm chair that has side arms. You can use this for support while you get dressed. Do not have throw rugs and other things on the floor that can make you trip. What can I do in the kitchen? Clean up any spills right away. Avoid walking on wet floors. Keep items that you use a lot in easy-to-reach places. If you need to reach something above you, use a strong step stool that has a grab bar. Keep electrical cords out of the way. Do not use floor polish or wax that makes floors slippery. If you must use wax, use non-skid floor wax. Do not have throw rugs and other things on the floor that can make you trip. What can I do with my stairs? Do not leave any items on the stairs. Make sure that there are handrails on both sides of the stairs and use them. Fix handrails that are broken or loose. Make sure that handrails are as long as the stairways. Check any carpeting to make sure that it is firmly attached to the stairs. Fix any carpet that is loose or worn. Avoid having throw rugs at the top or bottom of the stairs. If you do have throw rugs, attach them to the floor with  carpet tape. Make sure that you have a light switch at the top of the stairs and the bottom of the stairs. If you do not have them, ask someone to add them for you. What else can I do to help prevent falls? Wear shoes that: Do not have high heels. Have rubber bottoms. Are comfortable and fit you well. Are closed at the toe. Do not wear sandals. If you use a stepladder: Make sure that it is fully opened. Do not climb a closed stepladder. Make sure that both sides of the stepladder are locked into place. Ask someone to hold it for you, if possible. Clearly mark and make sure that you can see: Any grab bars or handrails. First and last steps. Where the edge of each step is. Use tools that help you move around (mobility aids)  if they are needed. These include: Canes. Walkers. Scooters. Crutches. Turn on the lights when you go into a dark area. Replace any light bulbs as soon as they burn out. Set up your furniture so you have a clear path. Avoid moving your furniture around. If any of your floors are uneven, fix them. If there are any pets around you, be aware of where they are. Review your medicines with your doctor. Some medicines can make you feel dizzy. This can increase your chance of falling. Ask your doctor what other things that you can do to help prevent falls. This information is not intended to replace advice given to you by your health care provider. Make sure you discuss any questions you have with your health care provider. Document Released: 05/15/2009 Document Revised: 12/25/2015 Document Reviewed: 08/23/2014 Elsevier Interactive Patient Education  2017 [employer].

## 2023-01-31 NOTE — Progress Notes (Signed)
Subjective:   Sophia Nash is a 88 y.o. female who presents for Medicare Annual (Subsequent) preventive examination at [facility].   Visit Complete: In person  Patient Medicare AWV questionnaire was completed by the patient on 01/31/23; I have confirmed that all information answered by patient is correct and no changes since this date.  Review of Systems           Objective:    Today's Vitals   01/31/23 1338 01/31/23 1339  BP: (!) 140/82   Pulse: 89   Resp: 18   Temp: 98.1 F (36.7 C)   TempSrc: Temporal   SpO2: 99%   Weight: 175 lb (79.4 kg)   Height: 5\' 2"  (1.575 m)   PainSc:  5    Body mass index is 32.01 kg/m.     01/31/2023    1:33 PM 10/14/2022   12:43 PM 08/04/2022    3:14 PM 07/29/2022   10:09 AM 07/22/2022    2:06 PM 07/19/2022    3:21 PM 04/21/2022    9:36 AM  Advanced Directives  Does Patient Have a Medical Advance Directive? Yes Yes Yes Yes Yes Yes Yes  Type of Estate agent of [place];Living will Healthcare Power of [employer] Power of [employer] Power of [place];Living will Healthcare Power of [place];Living will Healthcare Power of [place];Living will Healthcare Power of [place];Living will  Does patient want to make changes to medical advance directive? No - Patient declined No - Patient declined No - Patient declined No - Patient declined No - Patient declined No - Patient declined No - Patient declined  Copy of Healthcare Power of Attorney in Chart? Yes - validated most recent copy scanned in chart (See row information) Yes - validated most recent copy scanned in chart (See row information) Yes - validated most recent copy scanned in chart (See row information) Yes - validated most recent copy scanned in chart (See row information) Yes - validated most recent copy scanned in chart (See row information) Yes - validated most recent copy scanned in chart (See row information) Yes  - validated most recent copy scanned in chart (See row information)    Current Medications (verified) Outpatient Encounter Medications as of 01/31/2023  Medication Sig   albuterol (VENTOLIN HFA) 108 (90 Base) MCG/ACT inhaler Inhale 2 puffs into the lungs every 6 (six) hours as needed for wheezing or shortness of breath.   Calcium Carb-Cholecalciferol (CALCIUM 600 + D PO) Take 1 tablet by mouth daily.   Carboxymethylcellulose Sodium (LUBRICANT EYE DROPS OP) Apply 1 drop to eye as needed.   celecoxib (CELEBREX) 100 MG capsule TAKE 1 CAPSULE BY MOUTH 2 TIMES DAILY AS NEEDED FOR MODERATE PAIN.   clonazePAM (KLONOPIN) 0.5 MG tablet TAKE 1 TABLET BY MOUTH 2 TIMES DAILY AS NEEDED FOR ANXIETY.   cyanocobalamin 100 MCG tablet Take 100 mcg by mouth daily.   hyoscyamine (LEVSIN SL) 0.125 MG SL tablet PLACE 1 TABLET (0.125 MG TOTAL) UNDER THE TONGUE 4 (FOUR) TIMES DAILY AS NEEDED FOR CRAMPING.   loratadine (CLARITIN) 10 MG tablet Take 10 mg by mouth daily.   methocarbamol (ROBAXIN) 500 MG tablet Take 0.5 tablets (250 mg total) by mouth 2 (two) times daily as needed for muscle spasms.   Multiple Vitamins-Minerals (PRESERVISION AREDS 2+MULTI VIT PO) Take 1 capsule by mouth daily.   omeprazole (PRILOSEC) 20 MG capsule TAKE 1 CAPSULE BY MOUTH EVERY DAY   polyethylene glycol (MIRALAX / GLYCOLAX) 17 g  packet Take 17 g by mouth daily as needed.   traMADol (ULTRAM) 50 MG tablet Take 50 mg by mouth 2 (two) times daily as needed.   triamterene-hydrochlorothiazide (DYAZIDE) 37.5-25 MG capsule TAKE 1 EACH (1 CAPSULE TOTAL) BY MOUTH EVERY MORNING.   [DISCONTINUED] amLODipine (NORVASC) 5 MG tablet TAKE 1 TABLET (5 MG TOTAL) BY MOUTH DAILY.   [DISCONTINUED] DULoxetine (CYMBALTA) 60 MG capsule TAKE 1 CAPSULE BY MOUTH EVERY DAY   [DISCONTINUED] levothyroxine (SYNTHROID) 50 MCG tablet Take 1 tablet (50 mcg total) by mouth daily before breakfast.   [DISCONTINUED] rosuvastatin (CRESTOR) 5 MG tablet TAKE 1 TABLET (5 MG TOTAL)  BY MOUTH DAILY.   amLODipine (NORVASC) 5 MG tablet Take 1 tablet (5 mg total) by mouth daily.   DULoxetine (CYMBALTA) 60 MG capsule Take 1 capsule (60 mg total) by mouth daily.   levothyroxine (SYNTHROID) 50 MCG tablet Take 1 tablet (50 mcg total) by mouth daily before breakfast.   rosuvastatin (CRESTOR) 5 MG tablet Take 1 tablet (5 mg total) by mouth daily.   No facility-administered encounter medications on file as of 01/31/2023.    Allergies (verified) Penicillins, Codeine, and Nsaids   History: Past Medical History:  Diagnosis Date   Allergy    Anemia    Anginal pain (HCC)    Anxiety    Arthritis    Atypical moles    Breast lump    left   Chronic kidney disease    Colon polyp    Depression    Diverticulitis    04-15-15 no recent issues   Dysrhythmia 01/2020   Edema    legs, hands  bilateral   GERD (gastroesophageal reflux disease)    hx of    Heart murmur 01/2020   High cholesterol    Per new patient form   Hypertension    Hypothyroidism    IBS (irritable bowel syndrome)    Per new patient form   Impaired hearing    bilaterally some hearing loss-no hearing aids   Lactose intolerance    Per new patient form   Macular degeneration    Per new patient form   Mild cognitive impairment with memory loss 08/02/2000   per Dr Loleta Chance, neuro, 04-15-15 no changes-"stable"   Recurrent upper respiratory infection (URI)    cold with sinus drainage x several months- saw PCP 10/08/11- chest x ray report on chart.  no fever,resolved, no recent issues as of 04-15-15.   Shoulder joint pain    04-07-15 "tripped-loss balance, fell awkwardly and "wrenched shoulder-left", Known "bad right shoulder"bone on bone"   Skin lesion of breast    hx of right lateral breast  lesion-tx. with multipe rounds oral antibiotic- resolved, no reoccurrence.   Sleep apnea    Thyroid disease    hypothyroidism   Past Surgical History:  Procedure Laterality Date   BREAST SURGERY     lumpectomy- left-benign    CATARACT EXTRACTION, BILATERAL Bilateral    CHOLECYSTECTOMY     laparoscopic   COLECTOMY  10/20/2011   sigmoid colectomy   COLONOSCOPY  2018   DG  BONE DENSITY ([facility] HX)  2021   Coralie Common, PA; [facility]/ Per new patient form   ESOPHAGOGASTRODUODENOSCOPY     EYE SURGERY     bilateral cataracts   HERNIA REPAIR     INCISIONAL HERNIA REPAIR N/A 01/12/2013   Procedure: LAPAROSCOPIC/OPEN REPAIR INCISIONAL HERNIA;  Surgeon: Mariella Saa, MD;  Location: [facility];  Service: General;  Laterality: N/A;  With  Mesh   INCISIONAL HERNIA REPAIR N/A 04/23/2015   Procedure: LAPAROSCOPIC ASSISTED INCISIONAL VENTRAL HERNIA REPAIR;  Surgeon: Glenna Fellows, MD;  Location: [facility];  Service: General;  Laterality: N/A;   INSERTION OF MESH N/A 04/23/2015   Procedure: INSERTION OF MESH;  Surgeon: Glenna Fellows, MD;  Location: [facility];  Service: General;  Laterality: N/A;   JOINT REPLACEMENT  05/2021   left hip   KYPHOPLASTY N/A 08/19/2021   Procedure: KYPHOPLASTY THORACIC TWELVE, LUMBAR ONE;  Surgeon: Tressie Stalker, MD;  Location: [facility] OR;  Service: Neurosurgery;  Laterality: N/A;  KYPHOPLASTY THORACIC TWELVE, LUMBAR ONE   TOTAL HIP ARTHROPLASTY Left 2022   [facility]; Per new patient form   TUBAL LIGATION  1972   Family History  Problem Relation Age of Onset   Cancer Mother        breast   Heart disease Father    Hypertension Sister    Dementia Sister    Cancer Brother        colon   Heart Problems Brother    High Cholesterol Daughter    Social History   Socioeconomic History   Marital status: Widowed    Spouse name: Not on file   Number of children: Not on file   Years of education: Not on file   Highest education level: Not on file  Occupational History   Not on file  Tobacco Use   Smoking status: Never   Smokeless tobacco: Never  Vaping Use   Vaping Use: Never used  Substance and Sexual Activity   Alcohol use: No   Drug use: Never   Sexual  activity: Not on file  Other Topics Concern   Not on file  Social History Narrative   Diet: Regular, normal diet      Caffeine: Yes      Married, if yes what year: Widow, married in 1959      Do you live in a house, apartment, assisted living, condo, trailer, ect: Home, assisted living      Is it one or more stories: One      How many persons live in your home? I am alone, 4 years      Pets: No      Highest level or education completed: Did not answer      Current/Past profession: Past Veterinary surgeon, now retired      Exercise: Until this year          Type and how often: Did not answer         Living Will: Yes   DNR: No    If not, do you want to discuss one? Not yet   POA/HPOA: Yes      Functional Status:   Do you have difficulty bathing or dressing yourself? Did not answer   Do you have difficulty preparing food or eating? Did not answer   Do you have difficulty managing your medications? Did not answer   Do you have difficulty managing your finances? Did not answer   Do you have difficulty affording your medications? Did not answer      Social Determinants of Health   Financial Resource Strain: Not on file  Food Insecurity: Not on file  Transportation Needs: Not on file  Physical Activity: Not on file  Stress: Not on file  Social Connections: Not on file    Tobacco Counseling Counseling given: Not Answered   Clinical Intake:     Pain : 0-10  Pain Score: 5  Pain Type: Chronic pain Pain Location: Back Pain Orientation: Lower Pain Onset: Today Pain Frequency: Several days a week Pain Relieving Factors: patches and pain medication  Pain Relieving Factors: patches and pain medication  BMI - recorded: 32.01 Nutritional Status: BMI > 30  Obese Nutritional Risks: None Diabetes: No  How often do you need to have someone help you when you read instructions, pamphlets, or other written materials from your doctor or pharmacy?: 3 - Sometimes  Interpreter  Needed?: No      Activities of Daily Living    01/31/2023    1:36 PM  In your present state of health, do you have any difficulty performing the following activities:  Hearing? 0  Vision? 1  Difficulty concentrating or making decisions? 1  Walking or climbing stairs? 1  Dressing or bathing? 0    Patient Care Team: Mahlon Gammon, MD as PCP - General (Internal Medicine) Glenna Fellows, MD (Inactive) as Consulting Physician (General Surgery) Mahlon Gammon, MD as Consulting Physician (Internal Medicine) Lyndal Pulley, MD as Referring Physician (Obstetrics and Gynecology) Little Ishikawa, MD as Consulting Physician (Cardiology)  Indicate any recent Medical Services you may have received from other than [facility] providers in the past year (date may be approximate).     Assessment:   This is a routine wellness examination for Ritchie.  Hearing/Vision screen No results found.  Dietary issues and exercise activities discussed:     Goals Addressed   None    Depression Screen    01/31/2023    1:33 PM 11/23/2022   10:45 AM 07/22/2022    2:06 PM 07/19/2022    3:21 PM  PHQ 2/9 Scores  PHQ - 2 Score 1 0 0 0    Fall Risk    01/31/2023    1:33 PM 11/23/2022   10:44 AM 07/22/2022    2:05 PM 07/19/2022    3:21 PM 10/12/2021    2:47 PM  Fall Risk   Falls in the past year? 1 0 1 1 1   Number falls in past yr: 0 0 1 1 1   Injury with Fall? 1 0 1 1 0  Risk for fall due to : History of fall(s);Impaired balance/gait;Impaired mobility History of fall(s) History of fall(s) History of fall(s) No Fall Risks  Follow up Falls evaluation completed  Falls evaluation completed Falls evaluation completed Falls evaluation completed    MEDICARE RISK AT HOME:  Medicare Risk at Home - 01/31/23 1411     Any stairs in or around the home? No    If so, are there any without handrails? No    Home free of loose throw rugs in walkways, pet beds, electrical cords, etc? Yes    Adequate  lighting in your home to reduce risk of falls? Yes    Use of a cane, walker or w/c? Yes    Grab bars in the bathroom? Yes    Shower chair or bench in shower? Yes    Elevated toilet seat or a handicapped toilet? Yes             TIMED UP AND GO:  Was the test performed?  No    Cognitive Function:    01/31/2023    1:35 PM  MMSE - Mini Mental State Exam  Orientation to time 5  Orientation to Place 5  Registration 3  Attention/ Calculation 5  Recall 3  Language- name 2 objects 2  Language- repeat 1  Language-  follow 3 step command 3  Language- read & follow direction 1  Write a sentence 1  Copy design 1  Total score 30        Immunizations Immunization History  Administered Date(s) Administered   Fluad Quad(high Dose 65+) 05/07/2022   Influenza, High Dose Seasonal PF 04/19/2019, 04/18/2020, 06/17/2021   Influenza-Unspecified 04/19/2019, 04/18/2020, 06/17/2021   Moderna Covid-19 Vaccine Bivalent Booster 31yrs & up 08/16/2019, 09/12/2019, 06/29/2021   Moderna Sars-Covid-2 Vaccination 06/17/2020, 03/09/2021, 08/06/2021, 12/17/2021   Novel Infuenza-h1n1-09 07/09/2008   Pneumococcal Conjugate-13 12/20/2013   Pneumococcal Polysaccharide-23 08/17/2012   Zoster Recombinant(Shingrix) 01/30/2014, 12/18/2020, 04/18/2021    TDAP status: Up to date  Flu Vaccine status: Up to date  Pneumococcal vaccine status: Up to date  Covid-19 vaccine status: Declined, Education has been provided regarding the importance of this vaccine but patient still declined. Advised may receive this vaccine at local pharmacy or Health Dept.or vaccine clinic. Aware to provide a copy of the vaccination record if obtained from local pharmacy or Health Dept. Verbalized acceptance and understanding.  Qualifies for Shingles Vaccine? Yes   Zostavax completed Yes   Shingrix Completed?: Yes  Screening Tests Health Maintenance  Topic Date Due   DTaP/Tdap/Td (1 - Tdap) Never done   COVID-19 Vaccine (7 -  2023-24 season) 04/02/2022   INFLUENZA VACCINE  03/03/2023   Medicare Annual Wellness (AWV)  01/31/2024   Pneumonia Vaccine 85+ Years old  Completed   DEXA SCAN  Completed   Zoster Vaccines- Shingrix  Completed   HPV VACCINES  Aged Out    Health Maintenance  Health Maintenance Due  Topic Date Due   DTaP/Tdap/Td (1 - Tdap) Never done   COVID-19 Vaccine (7 - 2023-24 season) 04/02/2022    Colorectal cancer screening: No longer required.   Mammogram status: Completed 04/2022. Repeat every year  Bone Density status: Completed 2023. Results reflect: Bone density results: OSTEOPENIA. Repeat every 2 years.  Lung Cancer Screening: (Low Dose CT Chest recommended if Age 27-80 years, 20 pack-year currently smoking OR have quit w/in 15years.) does not qualify.   Lung Cancer Screening Referral: na  Additional Screening:  Hepatitis C Screening: does not qualify; Completed na  Vision Screening: Recommended annual ophthalmology exams for early detection of glaucoma and other disorders of the eye. Is the patient up to date with their annual eye exam?  Yes  Who is the provider or what is the name of the office in which the patient attends annual eye exams? [facility], and has retina specialist If pt is not established with a provider, would they like to be referred to a provider to establish care? No .   Dental Screening: Recommended annual dental exams for proper oral hygiene  Diabetic Foot Exam: Diabetic Foot Exam: Completed NA  Community Resource Referral / Chronic Care Management: CRR required this visit?  No   CCM required this visit?  No     Plan:     I have personally reviewed and noted the following in the patient's chart:   Medical and social history Use of alcohol, tobacco or illicit drugs  Current medications and supplements including opioid prescriptions. Patient is currently taking opioid prescriptions. Information provided to patient regarding non-opioid alternatives.  Patient advised to discuss non-opioid treatment plan with their provider. Functional ability and status Nutritional status Physical activity Advanced directives List of other physicians Hospitalizations, surgeries, and ER visits in previous 12 months Vitals Screenings to include cognitive, depression, and falls Referrals and appointments  In addition,  I have reviewed and discussed with patient certain preventive protocols, quality metrics, and best practice recommendations. A written personalized care plan for preventive services as well as general preventive health recommendations were provided to patient.     Fletcher Anon, NP   01/31/2023   After Visit Summary: [facility]  Nurse Notes: reports some left hip pain and received injection by Dr Rayburn Ma with no improvement. Now using pain patch. Reports she had an AWV in Dec with [employer] but wants one done here now. States her insurance will pay because its a new calendar year. Needs to come back for a routine visit since she canceled the one this month. Will get her on the schedule in August.

## 2023-02-02 ENCOUNTER — Encounter: Payer: Self-pay | Admitting: Orthopedic Surgery

## 2023-02-02 ENCOUNTER — Non-Acute Institutional Stay: Payer: Medicare HMO | Admitting: Orthopedic Surgery

## 2023-02-02 DIAGNOSIS — M25562 Pain in left knee: Secondary | ICD-10-CM

## 2023-02-02 DIAGNOSIS — W19XXXA Unspecified fall, initial encounter: Secondary | ICD-10-CM | POA: Diagnosis not present

## 2023-02-02 NOTE — Progress Notes (Signed)
[facility] Room Number: 533-A Place of Service:  ALF [phone]) Provider:  Hazle Nordmann, NP  PCP: Mahlon Gammon, MD  Patient Care Team: Mahlon Gammon, MD as PCP - General (Internal Medicine) Glenna Fellows, MD (Inactive) as Consulting Physician (General Surgery) Mahlon Gammon, MD as Consulting Physician (Internal Medicine) Lyndal Pulley, MD as Referring Physician (Obstetrics and Gynecology) Little Ishikawa, MD as Consulting Physician (Cardiology)  Extended Emergency Contact Information Primary Emergency Contact: East,Teresa Address: [address]          [place], [place] [address] [place] Home Phone: [phone] Mobile Phone: [phone] Relation: Daughter Secondary Emergency Contact: Germain Osgood Address: [address]          [place], [place] [address] [place] Home Phone: [phone] Mobile Phone: [phone] Relation: Daughter  Code Status:  FULL CODE Goals of care: Advanced Directive information    02/02/2023    9:36 AM  Advanced Directives  Does Patient Have a Medical Advance Directive? Yes  Type of Advance Directive Healthcare Power of Attorney  Does patient want to make changes to medical advance directive? No - Patient declined  Copy of Healthcare Power of Attorney in Chart? Yes - validated most recent copy scanned in chart (See row information)     Chief Complaint  Patient presents with   Acute Visit    Fall with left knee bruising.     HPI:  Pt is a 88 y.o. female seen today for an acute visit due to left knee pain.   She currently resides on the assisted living unit at [facility]. PMH: HTN, HLD, GERD, IBS, hypothyroidism, compression fracture s/p kyphoplasty, carpal tunnel, depression,and anxiety.     07/02 she was getting out of the car and fell on her left knee. [place]. She denies hitting her head. She denies dizziness or  lightheadedness prior to fall. Admits using cane yesterday instead of rolator.   Today, she has noticed small bruise to left elbow. Left knee pain described as soreness, rated 4/10, no radiation. Pain controlled with Tramadol prn. WBAT. She denies difficulty ambulating at this time. Afebrile. Vitals stable.    Past Medical History:  Diagnosis Date   Allergy    Anemia    Anginal pain (HCC)    Anxiety    Arthritis    Atypical moles    Breast lump    left   Chronic kidney disease    Colon polyp    Depression    Diverticulitis    04-15-15 no recent issues   Dysrhythmia 01/2020   Edema    legs, hands  bilateral   GERD (gastroesophageal reflux disease)    hx of    Heart murmur 01/2020   High cholesterol    Per new patient form   Hypertension    Hypothyroidism    IBS (irritable bowel syndrome)    Per new patient form   Impaired hearing    bilaterally some hearing loss-no hearing aids   Lactose intolerance    Per new patient form   Macular degeneration    Per new patient form   Mild cognitive impairment with memory loss 08/02/2000   per Dr Loleta Chance, neuro, 04-15-15 no changes-"stable"   Recurrent upper respiratory infection (URI)    cold with sinus drainage x several months- saw PCP 10/08/11- chest x ray report on chart.  no fever,resolved, no recent issues as of 04-15-15.  Shoulder joint pain    04-07-15 "tripped-loss balance, fell awkwardly and "wrenched shoulder-left", Known "bad right shoulder"bone on bone"   Skin lesion of breast    hx of right lateral breast  lesion-tx. with multipe rounds oral antibiotic- resolved, no reoccurrence.   Sleep apnea    Thyroid disease    hypothyroidism   Past Surgical History:  Procedure Laterality Date   BREAST SURGERY     lumpectomy- left-benign   CATARACT EXTRACTION, BILATERAL Bilateral    CHOLECYSTECTOMY     laparoscopic   COLECTOMY  10/20/2011   sigmoid colectomy   COLONOSCOPY  2018   DG  BONE DENSITY ([facility] HX)  2021   Coralie Common, PA; [facility]/ Per new patient form   ESOPHAGOGASTRODUODENOSCOPY     EYE SURGERY     bilateral cataracts   HERNIA REPAIR     INCISIONAL HERNIA REPAIR N/A 01/12/2013   Procedure: LAPAROSCOPIC/OPEN REPAIR INCISIONAL HERNIA;  Surgeon: Mariella Saa, MD;  Location: [facility];  Service: General;  Laterality: N/A;  With Mesh   INCISIONAL HERNIA REPAIR N/A 04/23/2015   Procedure: LAPAROSCOPIC ASSISTED INCISIONAL VENTRAL HERNIA REPAIR;  Surgeon: Glenna Fellows, MD;  Location: [facility];  Service: General;  Laterality: N/A;   INSERTION OF MESH N/A 04/23/2015   Procedure: INSERTION OF MESH;  Surgeon: Glenna Fellows, MD;  Location: [facility];  Service: General;  Laterality: N/A;   JOINT REPLACEMENT  05/2021   left hip   KYPHOPLASTY N/A 08/19/2021   Procedure: KYPHOPLASTY THORACIC TWELVE, LUMBAR ONE;  Surgeon: Tressie Stalker, MD;  Location: [facility] OR;  Service: Neurosurgery;  Laterality: N/A;  KYPHOPLASTY THORACIC TWELVE, LUMBAR ONE   TOTAL HIP ARTHROPLASTY Left 2022   [facility]; Per new patient form   TUBAL LIGATION  1972    Allergies  Allergen Reactions   Penicillins Anaphylaxis   Codeine     Makes pt overly excited.... Anxious/ hyperactive    Nsaids     Burns stomach     Allergies as of 02/02/2023       Reactions   Penicillins Anaphylaxis   Codeine    Makes pt overly excited.... Anxious/ hyperactive   Nsaids    Burns stomach         Medication List        Accurate as of February 02, 2023  9:37 AM. If you have any questions, ask your nurse or doctor.          albuterol 108 (90 Base) MCG/ACT inhaler Commonly known as: VENTOLIN HFA Inhale 2 puffs into the lungs every 6 (six) hours as needed for wheezing or shortness of breath.   amLODipine 5 MG tablet Commonly known as: NORVASC Take 1 tablet (5 mg total) by mouth daily.   CALCIUM 600 + D PO Take 1 tablet by mouth daily.   celecoxib 100 MG capsule Commonly known as: CELEBREX TAKE 1  CAPSULE BY MOUTH 2 TIMES DAILY AS NEEDED FOR MODERATE PAIN.   clonazePAM 0.5 MG tablet Commonly known as: KLONOPIN TAKE 1 TABLET BY MOUTH 2 TIMES DAILY AS NEEDED FOR ANXIETY.   cyanocobalamin 100 MCG tablet Take 100 mcg by mouth daily.   DULoxetine 60 MG capsule Commonly known as: CYMBALTA Take 1 capsule (60 mg total) by mouth daily.   hyoscyamine 0.125 MG SL tablet Commonly known as: LEVSIN SL PLACE 1 TABLET (0.125 MG TOTAL) UNDER THE TONGUE 4 (FOUR) TIMES DAILY AS NEEDED FOR CRAMPING.   levothyroxine 50 MCG tablet Commonly known  as: SYNTHROID Take 1 tablet (50 mcg total) by mouth daily before breakfast.   loratadine 10 MG tablet Commonly known as: CLARITIN Take 10 mg by mouth daily.   LUBRICANT EYE DROPS OP Apply 1 drop to eye as needed.   methocarbamol 500 MG tablet Commonly known as: ROBAXIN Take 0.5 tablets (250 mg total) by mouth 2 (two) times daily as needed for muscle spasms.   omeprazole 20 MG capsule Commonly known as: PRILOSEC TAKE 1 CAPSULE BY MOUTH EVERY DAY   polyethylene glycol 17 g packet Commonly known as: MIRALAX / GLYCOLAX Take 17 g by mouth daily as needed.   PRESERVISION AREDS 2+MULTI VIT PO Take 1 capsule by mouth daily.   rosuvastatin 5 MG tablet Commonly known as: CRESTOR Take 1 tablet (5 mg total) by mouth daily.   traMADol 50 MG tablet Commonly known as: ULTRAM Take 50 mg by mouth 2 (two) times daily as needed.   triamterene-hydrochlorothiazide 37.5-25 MG capsule Commonly known as: DYAZIDE TAKE 1 EACH (1 CAPSULE TOTAL) BY MOUTH EVERY MORNING.        Review of Systems  Constitutional:  Negative for activity change and appetite change.  Respiratory:  Negative for cough, shortness of breath and wheezing.   Cardiovascular:  Negative for chest pain and leg swelling.  Gastrointestinal:  Negative for nausea and vomiting.  Musculoskeletal:  Positive for arthralgias, back pain and gait problem.  Skin:  Positive for color change.  Negative for wound.  Neurological:  Negative for dizziness, weakness, light-headedness and headaches.  Psychiatric/Behavioral:  Negative for confusion and dysphoric mood. The patient is not nervous/anxious.     Immunization History  Administered Date(s) Administered   Fluad Quad(high Dose 65+) 05/07/2022   Influenza, High Dose Seasonal PF 04/19/2019, 04/18/2020, 06/17/2021   Influenza-Unspecified 04/19/2019, 04/18/2020, 06/17/2021   Moderna Covid-19 Vaccine Bivalent Booster 13yrs & up 08/16/2019, 09/12/2019, 06/29/2021   Moderna Sars-Covid-2 Vaccination 06/17/2020, 03/09/2021, 08/06/2021, 12/17/2021   Novel Infuenza-h1n1-09 07/09/2008   Pneumococcal Conjugate-13 12/20/2013   Pneumococcal Polysaccharide-23 08/17/2012   Zoster Recombinant(Shingrix) 01/30/2014, 12/18/2020, 04/18/2021   Pertinent  Health Maintenance Due  Topic Date Due   INFLUENZA VACCINE  03/03/2023   DEXA SCAN  Completed      10/12/2021    2:47 PM 07/19/2022    3:21 PM 07/22/2022    2:05 PM 11/23/2022   10:44 AM 01/31/2023    1:33 PM  Fall Risk  Falls in the past year? 1 1 1  0 1  Was there an injury with Fall? 0 1 1 0 1  Fall Risk Category Calculator 2 3 3  0 2  Fall Risk Category (Retired) Moderate High High    (RETIRED) Patient Fall Risk Level Moderate fall risk Moderate fall risk Moderate fall risk    Patient at Risk for Falls Due to No Fall Risks History of fall(s) History of fall(s) History of fall(s) History of fall(s);Impaired balance/gait;Impaired mobility  Fall risk Follow up Falls evaluation completed Falls evaluation completed Falls evaluation completed  Falls evaluation completed   Functional Status Survey:    Vitals:   02/02/23 0925  BP: 126/83  Pulse: 84  Resp: 16  Temp: (!) 97.2 F (36.2 C)  SpO2: 96%  Weight: 176 lb 9.6 oz (80.1 kg)  Height: 5\' 2"  (1.575 m)   Body mass index is 32.3 kg/m. Physical Exam Vitals reviewed.  Constitutional:      General: She is not in acute  distress. HENT:     Head: Normocephalic and atraumatic.  Eyes:  General:        Right eye: No discharge.        Left eye: No discharge.     Extraocular Movements: Extraocular movements intact.     Pupils: Pupils are equal, round, and reactive to light.  Cardiovascular:     Rate and Rhythm: Normal rate and regular rhythm.     Pulses: Normal pulses.     Heart sounds: Normal heart sounds.  Pulmonary:     Effort: Pulmonary effort is normal. No respiratory distress.     Breath sounds: Normal breath sounds. No wheezing.  Abdominal:     General: Bowel sounds are normal.     Palpations: Abdomen is soft.  Musculoskeletal:     Left elbow: No swelling or deformity. Normal range of motion. No tenderness.     Cervical back: Neck supple.     Left knee: No swelling, deformity, erythema, lacerations or crepitus. Normal range of motion. No tenderness.     Right lower leg: No edema.     Left lower leg: No edema.  Skin:    General: Skin is warm.     Capillary Refill: Capillary refill takes less than 2 seconds.     Findings: Bruising present.     Comments: Approx 1 cm x 1 cm bruise to left elbow, no skin break down.   Neurological:     General: No focal deficit present.     Mental Status: She is alert and oriented to person, place, and time.  Psychiatric:        Mood and Affect: Mood normal.     Labs reviewed: Recent Labs    04/14/22 0000 11/23/22 0000  NA 136* 136*  K 4.7 4.5  CL 97* 98*  CO2 26* 25*  BUN  --  15  CREATININE 1.0 0.8  CALCIUM 9.2 9.3   Recent Labs    04/14/22 0000 11/23/22 0000  AST 15 20  ALT 16 17  ALKPHOS 94 95  ALBUMIN 4.3 4.6   Recent Labs    04/14/22 0000  WBC 7.1  HGB 12.8  HCT 37  PLT 283   Lab Results  Component Value Date   TSH 2.09 11/23/2022   No results found for: "HGBA1C" Lab Results  Component Value Date   CHOL 191 01/26/2022   HDL 77 (A) 01/26/2022   LDLCALC 89 01/26/2022   TRIG 124 01/26/2022    Significant Diagnostic  Results in last 30 days:  No results found.  Assessment/Plan 1. Acute pain of left knee - 07/02 mechanical fall in front of [place]> fell on left side - she is WBAT - no apparent deformity on exam - falls prevention discussed - pain controlled with tramadol prn - recommend ice applications for increased swelling or pain - if pain worsens recommend xray  2. Fall, initial encounter - see above - using cane instead of rolator at times of fall    Family/ staff Communication: plan discussed with patient and nurse  Labs/tests ordered:  none

## 2023-02-16 ENCOUNTER — Institutional Professional Consult (permissible substitution): Payer: Medicare HMO | Admitting: Nurse Practitioner

## 2023-02-17 ENCOUNTER — Encounter: Payer: Self-pay | Admitting: Adult Health

## 2023-02-17 ENCOUNTER — Other Ambulatory Visit: Payer: Self-pay | Admitting: Adult Health

## 2023-02-17 DIAGNOSIS — H353132 Nonexudative age-related macular degeneration, bilateral, intermediate dry stage: Secondary | ICD-10-CM | POA: Diagnosis not present

## 2023-02-17 DIAGNOSIS — H43813 Vitreous degeneration, bilateral: Secondary | ICD-10-CM | POA: Diagnosis not present

## 2023-02-17 MED ORDER — METHOCARBAMOL 500 MG PO TABS: 250.0000 mg | ORAL_TABLET | Freq: Two times a day (BID) | ORAL | 0 refills | Status: DC | PRN

## 2023-02-17 NOTE — Progress Notes (Signed)
Location[facility] Room Number: 533 Place of Service:  SNF [phone]) Provider:  Fletcher Anon, NP  Mahlon Gammon, MD  Patient Care Team: Mahlon Gammon, MD as PCP - General (Internal Medicine) Glenna Fellows, MD (Inactive) as Consulting Physician (General Surgery) Mahlon Gammon, MD as Consulting Physician (Internal Medicine) Lyndal Pulley, MD as Referring Physician (Obstetrics and Gynecology) Little Ishikawa, MD as Consulting Physician (Cardiology)  Extended Emergency Contact Information Primary Emergency Contact: East,Teresa Address: [address]          [place], [place] [address] [place] Home Phone: [phone] Mobile Phone: [phone] Relation: Daughter Secondary Emergency Contact: Germain Osgood Address: [address]          [place], [place] [address] [place] Home Phone: [phone] Mobile Phone: [phone] Relation: Daughter  Code Status:  FULL CODE Goals of care: Advanced Directive information    02/17/2023   10:28 AM  Advanced Directives  Does Patient Have a Medical Advance Directive? Yes  Type of Advance Directive Healthcare Power of Attorney  Does patient want to make changes to medical advance directive? No - Patient declined  Copy of Healthcare Power of Attorney in Chart? Yes - validated most recent copy scanned in chart (See row information)     Chief Complaint  Patient presents with   Acute Visit    Hip pain    HPI:  Pt is a 88 y.o. female seen today for an acute visit for    Past Medical History:  Diagnosis Date   Allergy    Anemia    Anginal pain (HCC)    Anxiety    Arthritis    Atypical moles    Breast lump    left   Chronic kidney disease    Colon polyp    Depression    Diverticulitis    04-15-15 no recent issues   Dysrhythmia 01/2020   Edema    legs, hands  bilateral   GERD (gastroesophageal reflux disease)    hx of    Heart murmur  01/2020   High cholesterol    Per new patient form   Hypertension    Hypothyroidism    IBS (irritable bowel syndrome)    Per new patient form   Impaired hearing    bilaterally some hearing loss-no hearing aids   Lactose intolerance    Per new patient form   Macular degeneration    Per new patient form   Mild cognitive impairment with memory loss 08/02/2000   per Dr Loleta Chance, neuro, 04-15-15 no changes-"stable"   Recurrent upper respiratory infection (URI)    cold with sinus drainage x several months- saw PCP 10/08/11- chest x ray report on chart.  no fever,resolved, no recent issues as of 04-15-15.   Shoulder joint pain    04-07-15 "tripped-loss balance, fell awkwardly and "wrenched shoulder-left", Known "bad right shoulder"bone on bone"   Skin lesion of breast    hx of right lateral breast  lesion-tx. with multipe rounds oral antibiotic- resolved, no reoccurrence.   Sleep apnea    Thyroid disease    hypothyroidism   Past Surgical History:  Procedure Laterality Date   BREAST SURGERY     lumpectomy- left-benign   CATARACT EXTRACTION, BILATERAL Bilateral    CHOLECYSTECTOMY     laparoscopic   COLECTOMY  10/20/2011   sigmoid colectomy   COLONOSCOPY  2018   DG  BONE DENSITY ([facility] HX)  2021  Coralie Common, PA; [facility]/ Per new patient form   ESOPHAGOGASTRODUODENOSCOPY     EYE SURGERY     bilateral cataracts   HERNIA REPAIR     INCISIONAL HERNIA REPAIR N/A 01/12/2013   Procedure: LAPAROSCOPIC/OPEN REPAIR INCISIONAL HERNIA;  Surgeon: Mariella Saa, MD;  Location: [facility];  Service: General;  Laterality: N/A;  With Mesh   INCISIONAL HERNIA REPAIR N/A 04/23/2015   Procedure: LAPAROSCOPIC ASSISTED INCISIONAL VENTRAL HERNIA REPAIR;  Surgeon: Glenna Fellows, MD;  Location: [facility];  Service: General;  Laterality: N/A;   INSERTION OF MESH N/A 04/23/2015   Procedure: INSERTION OF MESH;  Surgeon: Glenna Fellows, MD;  Location: [facility];  Service: General;  Laterality: N/A;   JOINT  REPLACEMENT  05/2021   left hip   KYPHOPLASTY N/A 08/19/2021   Procedure: KYPHOPLASTY THORACIC TWELVE, LUMBAR ONE;  Surgeon: Tressie Stalker, MD;  Location: [facility] OR;  Service: Neurosurgery;  Laterality: N/A;  KYPHOPLASTY THORACIC TWELVE, LUMBAR ONE   TOTAL HIP ARTHROPLASTY Left 2022   [facility], [facility]; Per new patient form   TUBAL LIGATION  1972    Allergies  Allergen Reactions   Penicillins Anaphylaxis   Codeine     Makes pt overly excited.... Anxious/ hyperactive    Nsaids     Burns stomach     Allergies as of 02/17/2023       Reactions   Penicillins Anaphylaxis   Codeine    Makes pt overly excited.... Anxious/ hyperactive   Nsaids    Burns stomach         Medication List        Accurate as of February 17, 2023 10:33 AM. If you have any questions, ask your nurse or doctor.          albuterol 108 (90 Base) MCG/ACT inhaler Commonly known as: VENTOLIN HFA Inhale 2 puffs into the lungs every 6 (six) hours as needed for wheezing or shortness of breath.   amLODipine 5 MG tablet Commonly known as: NORVASC Take 1 tablet (5 mg total) by mouth daily.   CALCIUM 600 + D PO Take 1 tablet by mouth daily.   celecoxib 100 MG capsule Commonly known as: CELEBREX TAKE 1 CAPSULE BY MOUTH 2 TIMES DAILY AS NEEDED FOR MODERATE PAIN.   clonazePAM 0.5 MG tablet Commonly known as: KLONOPIN TAKE 1 TABLET BY MOUTH 2 TIMES DAILY AS NEEDED FOR ANXIETY.   cyanocobalamin 100 MCG tablet Take 100 mcg by mouth daily.   DULoxetine 60 MG capsule Commonly known as: CYMBALTA Take 1 capsule (60 mg total) by mouth daily.   hyoscyamine 0.125 MG SL tablet Commonly known as: LEVSIN SL PLACE 1 TABLET (0.125 MG TOTAL) UNDER THE TONGUE 4 (FOUR) TIMES DAILY AS NEEDED FOR CRAMPING.   levothyroxine 50 MCG tablet Commonly known as: SYNTHROID Take 1 tablet (50 mcg total) by mouth daily before breakfast.   loratadine 10 MG tablet Commonly known as: CLARITIN Take 10 mg by  mouth daily.   LUBRICANT EYE DROPS OP Apply 1 drop to eye as needed.   methocarbamol 500 MG tablet Commonly known as: ROBAXIN Take 0.5 tablets (250 mg total) by mouth 2 (two) times daily as needed for muscle spasms.   omeprazole 20 MG capsule Commonly known as: PRILOSEC Take 20 mg by mouth daily. What changed: Another medication with the same name was removed. Continue taking this medication, and follow the directions you see here. Changed by: Fletcher Anon   polyethylene glycol 17 g packet Commonly  known as: MIRALAX / GLYCOLAX Take 17 g by mouth daily as needed.   PRESERVISION AREDS 2+MULTI VIT PO Take 1 capsule by mouth daily.   rosuvastatin 5 MG tablet Commonly known as: CRESTOR Take 1 tablet (5 mg total) by mouth daily.   traMADol 50 MG tablet Commonly known as: ULTRAM Take 50 mg by mouth 2 (two) times daily as needed.   triamterene-hydrochlorothiazide 37.5-25 MG capsule Commonly known as: DYAZIDE TAKE 1 EACH (1 CAPSULE TOTAL) BY MOUTH EVERY MORNING.        Review of Systems  Immunization History  Administered Date(s) Administered   Fluad Quad(high Dose 65+) 05/07/2022   Influenza, High Dose Seasonal PF 04/19/2019, 04/18/2020, 06/17/2021   Influenza-Unspecified 04/19/2019, 04/18/2020, 06/17/2021   Moderna Covid-19 Vaccine Bivalent Booster 63yrs & up 08/16/2019, 09/12/2019, 06/29/2021   Moderna Sars-Covid-2 Vaccination 06/17/2020, 03/09/2021, 08/06/2021, 12/17/2021   Novel Infuenza-h1n1-09 07/09/2008   Pneumococcal Conjugate-13 12/20/2013   Pneumococcal Polysaccharide-23 08/17/2012   Zoster Recombinant(Shingrix) 01/30/2014, 12/18/2020, 04/18/2021   Pertinent  Health Maintenance Due  Topic Date Due   INFLUENZA VACCINE  03/03/2023   DEXA SCAN  Completed      07/19/2022    3:21 PM 07/22/2022    2:05 PM 11/23/2022   10:44 AM 01/31/2023    1:33 PM 02/02/2023   11:44 AM  Fall Risk  Falls in the past year? 1 1 0 1 1  Was there an injury with Fall? 1 1 0 1 0   Fall Risk Category Calculator 3 3 0 2 2  Fall Risk Category (Retired) [employer]     (RETIRED) Patient Fall Risk Level Moderate fall risk Moderate fall risk     Patient at Risk for Falls Due to History of fall(s) History of fall(s) History of fall(s) History of fall(s);Impaired balance/gait;Impaired mobility History of fall(s);Impaired balance/gait;Impaired mobility  Fall risk Follow up Falls evaluation completed Falls evaluation completed  Falls evaluation completed Falls evaluation completed;Education provided;Falls prevention discussed   Functional Status Survey:    Vitals:   02/17/23 1025  BP: (!) 158/83  Pulse: 91  Resp: 18  Temp: (!) 97.2 F (36.2 C)  SpO2: 96%  Weight: 176 lb 9.6 oz (80.1 kg)  Height: 5\' 2"  (1.575 m)   Body mass index is 32.3 kg/m. Physical Exam  Labs reviewed: Recent Labs    04/14/22 0000 11/23/22 0000  NA 136* 136*  K 4.7 4.5  CL 97* 98*  CO2 26* 25*  BUN  --  15  CREATININE 1.0 0.8  CALCIUM 9.2 9.3   Recent Labs    04/14/22 0000 11/23/22 0000  AST 15 20  ALT 16 17  ALKPHOS 94 95  ALBUMIN 4.3 4.6   Recent Labs    04/14/22 0000  WBC 7.1  HGB 12.8  HCT 37  PLT 283   Lab Results  Component Value Date   TSH 2.09 11/23/2022   No results found for: "HGBA1C" Lab Results  Component Value Date   CHOL 191 01/26/2022   HDL 77 (A) 01/26/2022   LDLCALC 89 01/26/2022   TRIG 124 01/26/2022    Significant Diagnostic Results in last 30 days:  No results found.  Assessment/Plan There are no diagnoses linked to this encounter.   Family/ staff Communication:   Labs/tests ordered:   This encounter was created in error - please disregard.

## 2023-02-21 ENCOUNTER — Encounter: Payer: Medicare HMO | Admitting: Adult Health

## 2023-03-03 ENCOUNTER — Non-Acute Institutional Stay: Payer: Medicare HMO | Admitting: Adult Health

## 2023-03-03 ENCOUNTER — Encounter: Payer: Self-pay | Admitting: Adult Health

## 2023-03-03 DIAGNOSIS — R269 Unspecified abnormalities of gait and mobility: Secondary | ICD-10-CM

## 2023-03-03 DIAGNOSIS — M6289 Other specified disorders of muscle: Secondary | ICD-10-CM | POA: Diagnosis not present

## 2023-03-03 DIAGNOSIS — F419 Anxiety disorder, unspecified: Secondary | ICD-10-CM

## 2023-03-03 NOTE — Progress Notes (Signed)
Location:  Medical illustrator of Service:  ALF (13) Provider:   Peggye Ley, ANP [facility] [phone]   Mahlon Gammon, MD  Patient Care Team: Mahlon Gammon, MD as PCP - General (Internal Medicine) Glenna Fellows, MD (Inactive) as Consulting Physician (General Surgery) Mahlon Gammon, MD as Consulting Physician (Internal Medicine) Lyndal Pulley, MD as Referring Physician (Obstetrics and Gynecology) Little Ishikawa, MD as Consulting Physician (Cardiology)  Extended Emergency Contact Information Primary Emergency Contact: East,Teresa Address: [address]          [place], [place] [address] [place] Home Phone: [phone] Mobile Phone: [phone] Relation: Daughter Secondary Emergency Contact: Germain Osgood Address: [address]          [place], [place] [address] [place] Home Phone: [phone] Mobile Phone: [phone] Relation: Daughter  Code Status:  Full Goals of care: Advanced Directive information    02/17/2023   10:28 AM  Advanced Directives  Does Patient Have a Medical Advance Directive? Yes  Type of Advance Directive Healthcare Power of Attorney  Does patient want to make changes to medical advance directive? No - Patient declined  Copy of Healthcare Power of Attorney in Chart? Yes - validated most recent copy scanned in chart (See row information)     Chief Complaint  Patient presents with   Acute Visit    fall    HPI:  Pt is a 87 y.o. female seen today for an acute visit for a fall. Resident fell while trying to get out of her chair (which had wheels) while doing a puzzle in the [place] area. She fell backwards and struck her head on 03/02/23.  She did not pass out. She reports falling backwards one time before a couple of months ago. She feel stiff today and has some tightness around the muscles of her neck. No significant pain. She had an elevated bp during that  time but it went down after calming techniques. She has been trying to sell her house and move out after 47 years which has been stressful for her. She has been using clonazepam at night to help with anxiety. She did take clonazepam one time during the day as well. She does not think it affects her gait or cognitive ability.   She denies any headache, dizziness, change in vision, or nausea.  Not on anticoagulation.  She does have chronic arthritis through most of her joints. She has chronic back pain due to a compression fracture and arthritis. Also has a prior left hip arthroplasty and some bursitis. Followed with ortho and had left hip injection in May.  PT was ordered for her due to gait issues but she has been holding off due to her stressful life circumstances but now feels ready to participate.    Past Medical History:  Diagnosis Date   Allergy    Anemia    Anginal pain (HCC)    Anxiety    Arthritis    Atypical moles    Breast lump    left   Chronic kidney disease    Colon polyp    Depression    Diverticulitis    04-15-15 no recent issues   Dysrhythmia 01/2020   Edema    legs, hands  bilateral   GERD (gastroesophageal reflux disease)    hx of    Heart murmur 01/2020   High cholesterol    Per new patient form   Hypertension  Hypothyroidism    IBS (irritable bowel syndrome)    Per new patient form   Impaired hearing    bilaterally some hearing loss-no hearing aids   Lactose intolerance    Per new patient form   Macular degeneration    Per new patient form   Mild cognitive impairment with memory loss 08/02/2000   per Dr Loleta Chance, neuro, 04-15-15 no changes-"stable"   Recurrent upper respiratory infection (URI)    cold with sinus drainage x several months- saw PCP 10/08/11- chest x ray report on chart.  no fever,resolved, no recent issues as of 04-15-15.   Shoulder joint pain    04-07-15 "tripped-loss balance, fell awkwardly and "wrenched shoulder-left", Known "bad right  shoulder"bone on bone"   Skin lesion of breast    hx of right lateral breast  lesion-tx. with multipe rounds oral antibiotic- resolved, no reoccurrence.   Sleep apnea    Thyroid disease    hypothyroidism   Past Surgical History:  Procedure Laterality Date   BREAST SURGERY     lumpectomy- left-benign   CATARACT EXTRACTION, BILATERAL Bilateral    CHOLECYSTECTOMY     laparoscopic   COLECTOMY  10/20/2011   sigmoid colectomy   COLONOSCOPY  2018   DG  BONE DENSITY ([facility] HX)  2021   Coralie Common, PA; [facility]/ Per new patient form   ESOPHAGOGASTRODUODENOSCOPY     EYE SURGERY     bilateral cataracts   HERNIA REPAIR     INCISIONAL HERNIA REPAIR N/A 01/12/2013   Procedure: LAPAROSCOPIC/OPEN REPAIR INCISIONAL HERNIA;  Surgeon: Mariella Saa, MD;  Location: [facility];  Service: General;  Laterality: N/A;  With Mesh   INCISIONAL HERNIA REPAIR N/A 04/23/2015   Procedure: LAPAROSCOPIC ASSISTED INCISIONAL VENTRAL HERNIA REPAIR;  Surgeon: Glenna Fellows, MD;  Location: [facility];  Service: General;  Laterality: N/A;   INSERTION OF MESH N/A 04/23/2015   Procedure: INSERTION OF MESH;  Surgeon: Glenna Fellows, MD;  Location: [facility];  Service: General;  Laterality: N/A;   JOINT REPLACEMENT  05/2021   left hip   KYPHOPLASTY N/A 08/19/2021   Procedure: KYPHOPLASTY THORACIC TWELVE, LUMBAR ONE;  Surgeon: Tressie Stalker, MD;  Location: [facility] OR;  Service: Neurosurgery;  Laterality: N/A;  KYPHOPLASTY THORACIC TWELVE, LUMBAR ONE   TOTAL HIP ARTHROPLASTY Left 2022   [facility]; Per new patient form   TUBAL LIGATION  1972    Allergies  Allergen Reactions   Penicillins Anaphylaxis   Codeine     Makes pt overly excited.... Anxious/ hyperactive    Nsaids     Burns stomach     Outpatient Encounter Medications as of 03/03/2023  Medication Sig   albuterol (VENTOLIN HFA) 108 (90 Base) MCG/ACT inhaler Inhale 2 puffs into the lungs every 6 (six) hours as needed for wheezing  or shortness of breath.   amLODipine (NORVASC) 5 MG tablet Take 1 tablet (5 mg total) by mouth daily.   Calcium Carb-Cholecalciferol (CALCIUM 600 + D PO) Take 1 tablet by mouth daily.   Carboxymethylcellulose Sodium (LUBRICANT EYE DROPS OP) Apply 1 drop to eye as needed.   celecoxib (CELEBREX) 100 MG capsule TAKE 1 CAPSULE BY MOUTH 2 TIMES DAILY AS NEEDED FOR MODERATE PAIN.   clonazePAM (KLONOPIN) 0.5 MG tablet TAKE 1 TABLET BY MOUTH 2 TIMES DAILY AS NEEDED FOR ANXIETY.   cyanocobalamin 100 MCG tablet Take 100 mcg by mouth daily.   DULoxetine (CYMBALTA) 60 MG capsule Take 1 capsule (60 mg total) by mouth  daily.   hyoscyamine (LEVSIN SL) 0.125 MG SL tablet PLACE 1 TABLET (0.125 MG TOTAL) UNDER THE TONGUE 4 (FOUR) TIMES DAILY AS NEEDED FOR CRAMPING.   levothyroxine (SYNTHROID) 50 MCG tablet Take 1 tablet (50 mcg total) by mouth daily before breakfast.   loratadine (CLARITIN) 10 MG tablet Take 10 mg by mouth daily.   methocarbamol (ROBAXIN) 500 MG tablet Take 0.5 tablets (250 mg total) by mouth 2 (two) times daily as needed for muscle spasms.   Multiple Vitamins-Minerals (PRESERVISION AREDS 2+MULTI VIT PO) Take 1 capsule by mouth daily.   omeprazole (PRILOSEC) 20 MG capsule Take 20 mg by mouth daily.   polyethylene glycol (MIRALAX / GLYCOLAX) 17 g packet Take 17 g by mouth daily as needed.   rosuvastatin (CRESTOR) 5 MG tablet Take 1 tablet (5 mg total) by mouth daily.   traMADol (ULTRAM) 50 MG tablet Take 50 mg by mouth 2 (two) times daily as needed.   triamterene-hydrochlorothiazide (DYAZIDE) 37.5-25 MG capsule TAKE 1 EACH (1 CAPSULE TOTAL) BY MOUTH EVERY MORNING.   No facility-administered encounter medications on file as of 03/03/2023.    Review of Systems  Constitutional:  Negative for activity change, appetite change, chills, diaphoresis, fatigue, fever and unexpected weight change.  HENT:  Negative for congestion.   Respiratory:  Negative for cough, shortness of breath and wheezing.    Cardiovascular:  Negative for chest pain, palpitations and leg swelling.  Gastrointestinal:  Negative for abdominal distention, abdominal pain, constipation and diarrhea.  Genitourinary:  Negative for difficulty urinating and dysuria.  Musculoskeletal:  Positive for arthralgias, back pain, gait problem and neck stiffness. Negative for joint swelling, myalgias and neck pain.  Skin:  Negative for wound.  Neurological:  Negative for dizziness, tremors, seizures, syncope, facial asymmetry, speech difficulty, weakness, light-headedness, numbness and headaches.  Psychiatric/Behavioral:  Negative for agitation, behavioral problems and confusion. The patient is nervous/anxious.     Immunization History  Administered Date(s) Administered   Fluad Quad(high Dose 65+) 05/07/2022   Influenza, High Dose Seasonal PF 04/19/2019, 04/18/2020, 06/17/2021   Influenza-Unspecified 04/19/2019, 04/18/2020, 06/17/2021   Moderna Covid-19 Vaccine Bivalent Booster 74yrs & up 08/16/2019, 09/12/2019, 06/29/2021   Moderna Sars-Covid-2 Vaccination 06/17/2020, 03/09/2021, 08/06/2021, 12/17/2021   Novel Infuenza-h1n1-09 07/09/2008   Pneumococcal Conjugate-13 12/20/2013   Pneumococcal Polysaccharide-23 08/17/2012   Zoster Recombinant(Shingrix) 01/30/2014, 12/18/2020, 04/18/2021   Pertinent  Health Maintenance Due  Topic Date Due   INFLUENZA VACCINE  03/03/2023   DEXA SCAN  Completed      07/19/2022    3:21 PM 07/22/2022    2:05 PM 11/23/2022   10:44 AM 01/31/2023    1:33 PM 02/02/2023   11:44 AM  Fall Risk  Falls in the past year? 1 1 0 1 1  Was there an injury with Fall? 1 1 0 1 0  Fall Risk Category Calculator 3 3 0 2 2  Fall Risk Category (Retired) [employer]     (RETIRED) Patient Fall Risk Level Moderate fall risk Moderate fall risk     Patient at Risk for Falls Due to History of fall(s) History of fall(s) History of fall(s) History of fall(s);Impaired balance/gait;Impaired mobility History of fall(s);Impaired  balance/gait;Impaired mobility  Fall risk Follow up Falls evaluation completed Falls evaluation completed  Falls evaluation completed Falls evaluation completed;Education provided;Falls prevention discussed   Functional Status Survey:    Vitals:   03/03/23 1325  BP: 122/74  Pulse: 73  Resp: 18  Temp: (!) 97.3 F (36.3 C)  SpO2: 96%  There is no height or weight on file to calculate BMI. Physical Exam Vitals and nursing note reviewed.  Constitutional:      General: She is not in acute distress.    Appearance: She is not diaphoretic.  HENT:     Head: Normocephalic and atraumatic.  Eyes:     Extraocular Movements: Extraocular movements intact.     Conjunctiva/sclera: Conjunctivae normal.     Pupils: Pupils are equal, round, and reactive to light.  Neck:     Vascular: No JVD.  Cardiovascular:     Rate and Rhythm: Normal rate and regular rhythm.     Heart sounds: Murmur heard.  Pulmonary:     Effort: Pulmonary effort is normal. No respiratory distress.     Breath sounds: Normal breath sounds. No wheezing.  Musculoskeletal:        General: No swelling, tenderness, deformity or signs of injury.     Right lower leg: No edema.     Left lower leg: No edema.     Comments: Antalgic gait No pain with weight bearing or range of motion of either hip Has some pain with right rotation of the neck. No pain with looking up or down. No pain with rotating to the left. No tenderness on palpation.   Neck muscles are tight Strength to BUE and BLE 5/5    Skin:    General: Skin is warm and dry.  Neurological:     General: No focal deficit present.     Mental Status: She is alert and oriented to person, place, and time. Mental status is at baseline.     Cranial Nerves: No cranial nerve deficit.     Coordination: Coordination normal.  Psychiatric:        Mood and Affect: Mood normal.     Labs reviewed: Recent Labs    04/14/22 0000 11/23/22 0000  NA 136* 136*  K 4.7 4.5  CL 97*  98*  CO2 26* 25*  BUN  --  15  CREATININE 1.0 0.8  CALCIUM 9.2 9.3   Recent Labs    04/14/22 0000 11/23/22 0000  AST 15 20  ALT 16 17  ALKPHOS 94 95  ALBUMIN 4.3 4.6   Recent Labs    04/14/22 0000  WBC 7.1  HGB 12.8  HCT 37  PLT 283   Lab Results  Component Value Date   TSH 2.09 11/23/2022   No results found for: "HGBA1C" Lab Results  Component Value Date   CHOL 191 01/26/2022   HDL 77 (A) 01/26/2022   LDLCALC 89 01/26/2022   TRIG 124 01/26/2022    Significant Diagnostic Results in last 30 days:  No results found.  Assessment/Plan  1. Gait abnormality With associated falls Has chronic back and hip pain followed by ortho Recommend PT eval /tx  2. Muscle tightness  To the neck area No signs of injury or concussion, normal neuro exam No pain other than when turning her head to the right reporting mild muscle tenderness Can use robaxin prn Strength is adequate  Report if worsening pain  3. Anxiety Can continue prn clonazepam but would recommend avoiding use during the day and monitoring gait after use. Has severe anxiety that she feels will now be improving with the selling of her house  Family/ staff Communication: resident   Labs/tests ordered:  NA   Total time :  time greater than 50% of total time spent doing pt counseling and coordination of care

## 2023-03-10 DIAGNOSIS — M6281 Muscle weakness (generalized): Secondary | ICD-10-CM | POA: Diagnosis not present

## 2023-03-10 DIAGNOSIS — Z9181 History of falling: Secondary | ICD-10-CM | POA: Diagnosis not present

## 2023-03-11 DIAGNOSIS — Z9181 History of falling: Secondary | ICD-10-CM | POA: Diagnosis not present

## 2023-03-11 DIAGNOSIS — M6281 Muscle weakness (generalized): Secondary | ICD-10-CM | POA: Diagnosis not present

## 2023-03-13 ENCOUNTER — Other Ambulatory Visit: Payer: Self-pay | Admitting: Internal Medicine

## 2023-03-14 DIAGNOSIS — M6281 Muscle weakness (generalized): Secondary | ICD-10-CM | POA: Diagnosis not present

## 2023-03-14 DIAGNOSIS — Z9181 History of falling: Secondary | ICD-10-CM | POA: Diagnosis not present

## 2023-03-14 NOTE — Telephone Encounter (Signed)
Please read note on prescription.  Medication pended and sent to Dr. Einar Crow

## 2023-03-16 ENCOUNTER — Other Ambulatory Visit: Payer: Self-pay | Admitting: Internal Medicine

## 2023-03-16 MED ORDER — HYOSCYAMINE SULFATE 0.125 MG SL SUBL: 0.1250 mg | SUBLINGUAL_TABLET | Freq: Every day | SUBLINGUAL | 1 refills | Status: DC | PRN

## 2023-03-17 DIAGNOSIS — Z9181 History of falling: Secondary | ICD-10-CM | POA: Diagnosis not present

## 2023-03-17 DIAGNOSIS — M6281 Muscle weakness (generalized): Secondary | ICD-10-CM | POA: Diagnosis not present

## 2023-03-18 ENCOUNTER — Other Ambulatory Visit: Payer: Self-pay | Admitting: Orthopedic Surgery

## 2023-03-18 DIAGNOSIS — S32010D Wedge compression fracture of first lumbar vertebra, subsequent encounter for fracture with routine healing: Secondary | ICD-10-CM

## 2023-03-21 DIAGNOSIS — Z9181 History of falling: Secondary | ICD-10-CM | POA: Diagnosis not present

## 2023-03-21 DIAGNOSIS — M6281 Muscle weakness (generalized): Secondary | ICD-10-CM | POA: Diagnosis not present

## 2023-03-21 NOTE — Telephone Encounter (Signed)
 Patient medication has Allergy Contraindications.

## 2023-03-22 ENCOUNTER — Encounter: Payer: Self-pay | Admitting: Internal Medicine

## 2023-03-22 ENCOUNTER — Non-Acute Institutional Stay: Payer: Medicare HMO | Admitting: Internal Medicine

## 2023-03-22 ENCOUNTER — Encounter: Payer: Medicare HMO | Admitting: Internal Medicine

## 2023-03-22 VITALS — BP 130/72 | HR 73 | Temp 98.0°F | Resp 17 | Ht 62.0 in | Wt 175.6 lb

## 2023-03-22 DIAGNOSIS — R350 Frequency of micturition: Secondary | ICD-10-CM | POA: Diagnosis not present

## 2023-03-22 DIAGNOSIS — M545 Low back pain, unspecified: Secondary | ICD-10-CM

## 2023-03-22 DIAGNOSIS — G8929 Other chronic pain: Secondary | ICD-10-CM

## 2023-03-22 DIAGNOSIS — R300 Dysuria: Secondary | ICD-10-CM

## 2023-03-22 MED ORDER — CIPROFLOXACIN HCL 500 MG PO TABS: 500.0000 mg | ORAL_TABLET | Freq: Two times a day (BID) | ORAL | 0 refills | Status: AC

## 2023-03-22 NOTE — Progress Notes (Signed)
Location: Medical illustrator of Service:  ALF (13)  Provider:   Code Status:  Goals of Care:     02/17/2023   10:28 AM  Advanced Directives  Does Patient Have a Medical Advance Directive? Yes  Type of Advance Directive Healthcare Power of Attorney  Does patient want to make changes to medical advance directive? No - Patient declined  Copy of Healthcare Power of Attorney in Chart? Yes - validated most recent copy scanned in chart (See row information)     Chief Complaint  Patient presents with   Acute Visit    Patient states she thinks she has a uti. Patient is having back pain, frequent urination     HPI: Patient is a 88 y.o. female seen today for an acute visit for Dysuria and Frequency  [place]ives in [place] in [place]  Started having symptoms of Dysuria last week. She started taking Azo and took 2 doses of Cipro. Her symptoms got better but then she started having symptoms again few days ago.  C/o Dysuria Frequency Lower abdominal pain No Fever or Chills No Nausea or vomiting  Patient is also struggling with Chronic Pain and wants to know if she should take Tramadol more regularly   Past Medical History:  Diagnosis Date   Allergy    Anemia    Anginal pain (HCC)    Anxiety    Arthritis    Atypical moles    Breast lump    left   Chronic kidney disease    Colon polyp    Depression    Diverticulitis    04-15-15 no recent issues   Dysrhythmia 01/2020   Edema    legs, hands  bilateral   GERD (gastroesophageal reflux disease)    hx of    Heart murmur 01/2020   High cholesterol    Per new patient form   Hypertension    Hypothyroidism    IBS (irritable bowel syndrome)    Per new patient form   Impaired hearing    bilaterally some hearing loss-no hearing aids   Lactose intolerance    Per new patient form   Macular degeneration    Per new patient form   Mild cognitive impairment with memory loss 08/02/2000   per Dr Loleta Chance, neuro, 04-15-15 no  changes-"stable"   Recurrent upper respiratory infection (URI)    cold with sinus drainage x several months- saw PCP 10/08/11- chest x ray report on chart.  no fever,resolved, no recent issues as of 04-15-15.   Shoulder joint pain    04-07-15 "tripped-loss balance, fell awkwardly and "wrenched shoulder-left", Known "bad right shoulder"bone on bone"   Skin lesion of breast    hx of right lateral breast  lesion-tx. with multipe rounds oral antibiotic- resolved, no reoccurrence.   Sleep apnea    Thyroid disease    hypothyroidism    Past Surgical History:  Procedure Laterality Date   BREAST SURGERY     lumpectomy- left-benign   CATARACT EXTRACTION, BILATERAL Bilateral    CHOLECYSTECTOMY     laparoscopic   COLECTOMY  10/20/2011   sigmoid colectomy   COLONOSCOPY  2018   DG  BONE DENSITY ([facility] HX)  2021   Coralie Common, [place]; [facility]/ Per new patient form   ESOPHAGOGASTRODUODENOSCOPY     EYE SURGERY     bilateral cataracts   HERNIA REPAIR     INCISIONAL HERNIA REPAIR N/A 01/12/2013   Procedure: LAPAROSCOPIC/OPEN REPAIR INCISIONAL HERNIA;  Surgeon: Sharlet Salina  Lurlean Leyden, MD;  Location: [facility];  Service: General;  Laterality: N/A;  With Mesh   INCISIONAL HERNIA REPAIR N/A 04/23/2015   Procedure: LAPAROSCOPIC ASSISTED INCISIONAL VENTRAL HERNIA REPAIR;  Surgeon: Glenna Fellows, MD;  Location: [facility];  Service: General;  Laterality: N/A;   INSERTION OF MESH N/A 04/23/2015   Procedure: INSERTION OF MESH;  Surgeon: Glenna Fellows, MD;  Location: [facility];  Service: General;  Laterality: N/A;   JOINT REPLACEMENT  05/2021   left hip   KYPHOPLASTY N/A 08/19/2021   Procedure: KYPHOPLASTY THORACIC TWELVE, LUMBAR ONE;  Surgeon: Tressie Stalker, MD;  Location: [facility] OR;  Service: Neurosurgery;  Laterality: N/A;  KYPHOPLASTY THORACIC TWELVE, LUMBAR ONE   TOTAL HIP ARTHROPLASTY Left 2022   [facility]; Per new patient form   TUBAL LIGATION  1972    Allergies  Allergen  Reactions   Penicillins Anaphylaxis   Codeine     Makes pt overly excited.... Anxious/ hyperactive    Nsaids     Burns stomach     Outpatient Encounter Medications as of 03/22/2023  Medication Sig   albuterol (VENTOLIN HFA) 108 (90 Base) MCG/ACT inhaler Inhale 2 puffs into the lungs every 6 (six) hours as needed for wheezing or shortness of breath.   amLODipine (NORVASC) 5 MG tablet Take 1 tablet (5 mg total) by mouth daily.   Calcium Carb-Cholecalciferol (CALCIUM 600 + D PO) Take 1 tablet by mouth daily.   Carboxymethylcellulose Sodium (LUBRICANT EYE DROPS OP) Apply 1 drop to eye as needed.   celecoxib (CELEBREX) 100 MG capsule TAKE 1 CAPSULE BY MOUTH 2 TIMES DAILY AS NEEDED FOR MODERATE PAIN.   clonazePAM (KLONOPIN) 0.5 MG tablet TAKE 1 TABLET BY MOUTH 2 TIMES DAILY AS NEEDED FOR ANXIETY.   cyanocobalamin 100 MCG tablet Take 100 mcg by mouth daily.   DULoxetine (CYMBALTA) 60 MG capsule Take 1 capsule (60 mg total) by mouth daily.   hyoscyamine (LEVSIN SL) 0.125 MG SL tablet Place 1 tablet (0.125 mg total) under the tongue daily as needed for cramping.   levothyroxine (SYNTHROID) 50 MCG tablet Take 1 tablet (50 mcg total) by mouth daily before breakfast.   loratadine (CLARITIN) 10 MG tablet Take 10 mg by mouth daily.   methocarbamol (ROBAXIN) 500 MG tablet Take 0.5 tablets (250 mg total) by mouth 2 (two) times daily as needed for muscle spasms.   Multiple Vitamins-Minerals (PRESERVISION AREDS 2+MULTI VIT PO) Take 1 capsule by mouth daily.   omeprazole (PRILOSEC) 20 MG capsule Take 20 mg by mouth daily.   polyethylene glycol (MIRALAX / GLYCOLAX) 17 g packet Take 17 g by mouth daily as needed.   rosuvastatin (CRESTOR) 5 MG tablet Take 1 tablet (5 mg total) by mouth daily.   traMADol (ULTRAM) 50 MG tablet Take 50 mg by mouth 2 (two) times daily as needed.   triamterene-hydrochlorothiazide (DYAZIDE) 37.5-25 MG capsule TAKE 1 EACH (1 CAPSULE TOTAL) BY MOUTH EVERY MORNING.   No  facility-administered encounter medications on file as of 03/22/2023.    Review of Systems:  Review of Systems  Constitutional:  Negative for activity change and appetite change.  HENT: Negative.    Respiratory:  Negative for cough and shortness of breath.   Cardiovascular:  Negative for leg swelling.  Gastrointestinal:  Negative for constipation.  Genitourinary:  Positive for dysuria, frequency and urgency.  Musculoskeletal:  Positive for arthralgias, back pain and myalgias. Negative for gait problem.  Skin: Negative.   Neurological:  Negative for  dizziness and weakness.  Psychiatric/Behavioral:  Negative for confusion, dysphoric mood and sleep disturbance.     Health Maintenance  Topic Date Due   DTaP/Tdap/Td (1 - Tdap) Never done   COVID-19 Vaccine (7 - 2023-24 season) 04/02/2022   INFLUENZA VACCINE  03/03/2023   Medicare Annual Wellness (AWV)  01/31/2024   Pneumonia Vaccine 44+ Years old  Completed   DEXA SCAN  Completed   Zoster Vaccines- Shingrix  Completed   HPV VACCINES  Aged Out    Physical Exam: Vitals:   03/22/23 0817  BP: 130/72  Pulse: 73  Resp: 17  Temp: 98 F (36.7 C)  TempSrc: Temporal  SpO2: 99%  Weight: 175 lb 9.6 oz (79.7 kg)  Height: 5\' 2"  (1.575 m)   Body mass index is 32.12 kg/m. Physical Exam Vitals reviewed.  Constitutional:      Appearance: Normal appearance.  HENT:     Head: Normocephalic.     Nose: Nose normal.     Mouth/Throat:     Mouth: Mucous membranes are moist.     Pharynx: Oropharynx is clear.  Eyes:     Pupils: Pupils are equal, round, and reactive to light.  Cardiovascular:     Rate and Rhythm: Normal rate and regular rhythm.     Pulses: Normal pulses.     Heart sounds: Normal heart sounds. No murmur heard. Pulmonary:     Effort: Pulmonary effort is normal.     Breath sounds: Normal breath sounds.  Abdominal:     General: Abdomen is flat. Bowel sounds are normal.     Palpations: Abdomen is soft.  Musculoskeletal:         General: No swelling.     Cervical back: Neck supple.  Skin:    General: Skin is warm.  Neurological:     General: No focal deficit present.     Mental Status: She is alert and oriented to person, place, and time.  Psychiatric:        Mood and Affect: Mood normal.        Thought Content: Thought content normal.     Labs reviewed: Basic Metabolic Panel: Recent Labs    04/14/22 0000 11/23/22 0000  NA 136* 136*  K 4.7 4.5  CL 97* 98*  CO2 26* 25*  BUN  --  15  CREATININE 1.0 0.8  CALCIUM 9.2 9.3  TSH  --  2.09   Liver Function Tests: Recent Labs    04/14/22 0000 11/23/22 0000  AST 15 20  ALT 16 17  ALKPHOS 94 95  ALBUMIN 4.3 4.6   No results for input(s): "LIPASE", "AMYLASE" in the last 8760 hours. No results for input(s): "AMMONIA" in the last 8760 hours. CBC: Recent Labs    04/14/22 0000  WBC 7.1  HGB 12.8  HCT 37  PLT 283   Lipid Panel: No results for input(s): "CHOL", "HDL", "LDLCALC", "TRIG", "CHOLHDL", "LDLDIRECT" in the last 8760 hours. No results found for: "HGBA1C"  Procedures since last visit: No results found.  Assessment/Plan 1. Dysuria Send UA and Culture Don't know how accurate its going to be as she has already taken Cipro Start her on Cipro 500 mg BID For 7 days Per patient that is only antibiotics which works for her Probiotics every day D/w the patient to not take her antibiotics Prn She does have appointment with Urology Avoid Hot Tub ? Caused her UTI She Takes Cranberry juice at night  2. Urinary frequency As above  3.  Chronic bilateral low back pain without sciatica Can take Tramadol with her other MEds    Labs/tests ordered:  CBC,CMP,Lipid before next appointment Next appt:  05/31/2023

## 2023-03-23 DIAGNOSIS — N390 Urinary tract infection, site not specified: Secondary | ICD-10-CM | POA: Diagnosis not present

## 2023-03-24 DIAGNOSIS — Z9181 History of falling: Secondary | ICD-10-CM | POA: Diagnosis not present

## 2023-03-24 DIAGNOSIS — M6281 Muscle weakness (generalized): Secondary | ICD-10-CM | POA: Diagnosis not present

## 2023-04-10 ENCOUNTER — Other Ambulatory Visit: Payer: Self-pay | Admitting: Internal Medicine

## 2023-04-10 DIAGNOSIS — K219 Gastro-esophageal reflux disease without esophagitis: Secondary | ICD-10-CM

## 2023-04-21 DIAGNOSIS — G5602 Carpal tunnel syndrome, left upper limb: Secondary | ICD-10-CM | POA: Diagnosis not present

## 2023-05-05 ENCOUNTER — Ambulatory Visit: Payer: Medicare HMO | Admitting: Orthopaedic Surgery

## 2023-05-05 ENCOUNTER — Encounter: Payer: Self-pay | Admitting: Orthopaedic Surgery

## 2023-05-05 DIAGNOSIS — M7062 Trochanteric bursitis, left hip: Secondary | ICD-10-CM | POA: Diagnosis not present

## 2023-05-05 DIAGNOSIS — Z9889 Other specified postprocedural states: Secondary | ICD-10-CM

## 2023-05-05 MED ORDER — LIDOCAINE HCL 1 % IJ SOLN
3.0000 mL | INTRAMUSCULAR | Status: AC | PRN
Start: 2023-05-05 — End: 2023-05-05
  Administered 2023-05-05: 3 mL

## 2023-05-05 MED ORDER — METHYLPREDNISOLONE ACETATE 40 MG/ML IJ SUSP
40.0000 mg | INTRAMUSCULAR | Status: AC | PRN
Start: 2023-05-05 — End: 2023-05-05
  Administered 2023-05-05: 40 mg via INTRA_ARTICULAR

## 2023-05-05 NOTE — Progress Notes (Signed)
The patient is an 88 year old female who comes in for her first postoperative visit status post a left open carpal tunnel release.  She says her hand is doing very well.  She does ambit with a rolling walker.  She has remote history of a left hip replacement done elsewhere.  Earlier this spring she had a mechanical fall injuring her left hip.  The x-rays look good but she did have some persistent trochanteric bursitis and a steroid injection of the trochanteric area back in May helped her quite a bit.  She says her hip is hurting some again and she would like to consider an injection again today.  Her carpal tunnel syndrome was very severe.  She said the surgery has helped her.  On exam I did remove the sutures from her left hand incision and place Steri-Strips.  There were no complicating features of the surgery itself that I can tell.  She does have weak grip and pinch strength and her daughter has given her exercises to try which are all appropriate.  Her left hip moves smoothly and fluidly.  She does have some pain over the trochanteric area of her hip and I agree with placing another injection in that area today.  She did tolerate that well.  From my standpoint we will see her back in 4 weeks to see how she is doing from a hand standpoint.  I would not consider an injection again with that hip until the late winter or early spring.    Procedure Note  Patient: Sophia Nash             Date of Birth: Oct 13, 1934           MRN: [id number]             Visit Date: 05/05/2023  Procedures: Visit Diagnoses:  1. Status post carpal tunnel release   2. Trochanteric bursitis, left hip     Large Joint Inj: L greater trochanter on 05/05/2023 3:18 PM Indications: pain and diagnostic evaluation Details: 22 G 1.5 in needle, lateral approach  Arthrogram: No  Medications: 3 mL lidocaine 1 %; 40 mg methylPREDNISolone acetate 40 MG/ML Outcome: tolerated well, no immediate complications Procedure,  treatment alternatives, risks and benefits explained, specific risks discussed. Consent was given by the patient. Immediately prior to procedure a time out was called to verify the correct patient, procedure, equipment, support staff and site/side marked as required. Patient was prepped and draped in the usual sterile fashion.

## 2023-05-14 DIAGNOSIS — N390 Urinary tract infection, site not specified: Secondary | ICD-10-CM | POA: Diagnosis not present

## 2023-05-16 ENCOUNTER — Encounter: Payer: Self-pay | Admitting: Internal Medicine

## 2023-05-16 ENCOUNTER — Non-Acute Institutional Stay: Payer: Medicare HMO | Admitting: Internal Medicine

## 2023-05-16 DIAGNOSIS — N390 Urinary tract infection, site not specified: Secondary | ICD-10-CM

## 2023-05-16 DIAGNOSIS — R300 Dysuria: Secondary | ICD-10-CM | POA: Diagnosis not present

## 2023-05-16 NOTE — Progress Notes (Signed)
Location:  [facility] Room Number: 533A Place of Service:  ALF [phone]) Provider:  Mahlon Gammon, MD   Mahlon Gammon, MD  Patient Care Team: Mahlon Gammon, MD as PCP - General (Internal Medicine) Glenna Fellows, MD (Inactive) as Consulting Physician (General Surgery) Mahlon Gammon, MD as Consulting Physician (Internal Medicine) Lyndal Pulley, MD as Referring Physician (Obstetrics and Gynecology) Little Ishikawa, MD as Consulting Physician (Cardiology)  Extended Emergency Contact Information Primary Emergency Contact: East,Teresa Address: [address]          [place], [place] [address] [place] Home Phone: [phone] Mobile Phone: [phone] Relation: Daughter Secondary Emergency Contact: Germain Osgood Address: [address]          [place], [place] [address] [place] Home Phone: [phone] Mobile Phone: [phone] Relation: Daughter  Code Status:  Full Code  Goals of care: Advanced Directive information    05/16/2023    4:40 PM  Advanced Directives  Does Patient Have a Medical Advance Directive? Yes  Type of Advance Directive Healthcare Power of Attorney  Does patient want to make changes to medical advance directive? No - Patient declined  Copy of Healthcare Power of Attorney in Chart? Yes - validated most recent copy scanned in chart (See row information)     Chief Complaint  Patient presents with   Acute Visit    Patient is being seen for an acute    HPI:  Pt is a 88 y.o. female seen today for an acute visit for Dysuria  [place] in [place] in [place]  Patient had severe Dysuria on Fri She had UA done Which showed TNTC WBC but now the culture is negative  Patient was started on Cipro before the culture was back She says her Symptoms are already improved She says she took Clindamycin on Thurs for Tooth extraction  She was made referral to Urology but she had refused Last  UTI was in 04/24 and then 8/24    Past Medical History:  Diagnosis Date   Allergy    Anemia    Anginal pain (HCC)    Anxiety    Arthritis    Atypical moles    Breast lump    left   Chronic kidney disease    Colon polyp    Depression    Diverticulitis    04-15-15 no recent issues   Dysrhythmia 01/2020   Edema    legs, hands  bilateral   GERD (gastroesophageal reflux disease)    hx of    Heart murmur 01/2020   High cholesterol    Per new patient form   Hypertension    Hypothyroidism    IBS (irritable bowel syndrome)    Per new patient form   Impaired hearing    bilaterally some hearing loss-no hearing aids   Lactose intolerance    Per new patient form   Macular degeneration    Per new patient form   Mild cognitive impairment with memory loss 08/02/2000   per Dr Loleta Chance, neuro, 04-15-15 no changes-"stable"   Recurrent upper respiratory infection (URI)    cold with sinus drainage x several months- saw PCP 10/08/11- chest x ray report on chart.  no fever,resolved, no recent issues as of 04-15-15.   Shoulder joint pain    04-07-15 "tripped-loss balance, fell awkwardly and "wrenched shoulder-left", Known "bad right shoulder"bone on bone"   Skin lesion of breast    hx of right lateral  breast  lesion-tx. with multipe rounds oral antibiotic- resolved, no reoccurrence.   Sleep apnea    Thyroid disease    hypothyroidism   Past Surgical History:  Procedure Laterality Date   BREAST SURGERY     lumpectomy- left-benign   CATARACT EXTRACTION, BILATERAL Bilateral    CHOLECYSTECTOMY     laparoscopic   COLECTOMY  10/20/2011   sigmoid colectomy   COLONOSCOPY  2018   DG  BONE DENSITY ([facility] HX)  2021   Coralie Common, PA; [facility]/ Per new patient form   ESOPHAGOGASTRODUODENOSCOPY     EYE SURGERY     bilateral cataracts   HERNIA REPAIR     INCISIONAL HERNIA REPAIR N/A 01/12/2013   Procedure: LAPAROSCOPIC/OPEN REPAIR INCISIONAL HERNIA;  Surgeon: Mariella Saa, MD;  Location: [facility]  ORS;  Service: General;  Laterality: N/A;  With Mesh   INCISIONAL HERNIA REPAIR N/A 04/23/2015   Procedure: LAPAROSCOPIC ASSISTED INCISIONAL VENTRAL HERNIA REPAIR;  Surgeon: Glenna Fellows, MD;  Location: [facility] ORS;  Service: General;  Laterality: N/A;   INSERTION OF MESH N/A 04/23/2015   Procedure: INSERTION OF MESH;  Surgeon: Glenna Fellows, MD;  Location: [facility] ORS;  Service: General;  Laterality: N/A;   JOINT REPLACEMENT  05/2021   left hip   KYPHOPLASTY N/A 08/19/2021   Procedure: KYPHOPLASTY THORACIC TWELVE, LUMBAR ONE;  Surgeon: Tressie Stalker, MD;  Location: [facility];  Service: Neurosurgery;  Laterality: N/A;  KYPHOPLASTY THORACIC TWELVE, LUMBAR ONE   TOTAL HIP ARTHROPLASTY Left 2022   [facility]; Per new patient form   TUBAL LIGATION  1972    Allergies  Allergen Reactions   Penicillins Anaphylaxis   Codeine     Makes pt overly excited.... Anxious/ hyperactive    Nsaids     Burns stomach     Outpatient Encounter Medications as of 05/16/2023  Medication Sig   albuterol (VENTOLIN HFA) 108 (90 Base) MCG/ACT inhaler Inhale 2 puffs into the lungs every 6 (six) hours as needed for wheezing or shortness of breath.   amLODipine (NORVASC) 5 MG tablet Take 1 tablet (5 mg total) by mouth daily.   Calcium Carb-Cholecalciferol (CALCIUM 600 + D PO) Take 1 tablet by mouth daily.   Carboxymethylcellulose Sodium (LUBRICANT EYE DROPS OP) Apply 1 drop to eye as needed.   celecoxib (CELEBREX) 100 MG capsule TAKE 1 CAPSULE BY MOUTH 2 TIMES DAILY AS NEEDED FOR MODERATE PAIN.   ciprofloxacin (CIPRO) 500 MG tablet Take 500 mg by mouth 2 (two) times daily.   clonazePAM (KLONOPIN) 0.5 MG tablet TAKE 1 TABLET BY MOUTH 2 TIMES DAILY AS NEEDED FOR ANXIETY.   cyanocobalamin 100 MCG tablet Take 100 mcg by mouth daily.   DULoxetine (CYMBALTA) 60 MG capsule Take 1 capsule (60 mg total) by mouth daily.   hyoscyamine (LEVSIN SL) 0.125 MG SL tablet Place 1 tablet (0.125 mg total)  under the tongue daily as needed for cramping.   levothyroxine (SYNTHROID) 50 MCG tablet Take 1 tablet (50 mcg total) by mouth daily before breakfast.   loratadine (CLARITIN) 10 MG tablet Take 10 mg by mouth daily.   methocarbamol (ROBAXIN) 500 MG tablet Take 0.5 tablets (250 mg total) by mouth 2 (two) times daily as needed for muscle spasms.   Multiple Vitamins-Minerals (PRESERVISION AREDS 2+MULTI VIT PO) Take 1 capsule by mouth daily.   omeprazole (PRILOSEC) 20 MG capsule TAKE 1 CAPSULE BY MOUTH EVERY DAY   polyethylene glycol (MIRALAX / GLYCOLAX) 17 g packet Take 17 g by  mouth daily as needed.   rosuvastatin (CRESTOR) 5 MG tablet Take 1 tablet (5 mg total) by mouth daily.   traMADol (ULTRAM) 50 MG tablet Take 50 mg by mouth 2 (two) times daily as needed.   triamterene-hydrochlorothiazide (DYAZIDE) 37.5-25 MG capsule TAKE 1 EACH (1 CAPSULE TOTAL) BY MOUTH EVERY MORNING.   No facility-administered encounter medications on file as of 05/16/2023.    Review of Systems  Constitutional:  Negative for activity change and appetite change.  HENT: Negative.    Respiratory:  Negative for cough and shortness of breath.   Cardiovascular:  Negative for leg swelling.  Gastrointestinal:  Negative for constipation.  Genitourinary: Negative.   Musculoskeletal:  Positive for back pain and gait problem. Negative for arthralgias and myalgias.  Skin: Negative.   Neurological:  Negative for dizziness and weakness.  Psychiatric/Behavioral:  Negative for confusion, dysphoric mood and sleep disturbance.     Immunization History  Administered Date(s) Administered   Fluad Quad(high Dose 65+) 05/07/2022   Influenza, High Dose Seasonal PF 04/19/2019, 04/18/2020, 06/17/2021   Influenza-Unspecified 04/19/2019, 04/18/2020, 06/17/2021   Moderna Covid-19 Vaccine Bivalent Booster 46yrs & up 08/16/2019, 09/12/2019, 06/29/2021   Moderna Sars-Covid-2 Vaccination 06/17/2020, 03/09/2021, 08/06/2021, 12/17/2021   Novel  Infuenza-h1n1-09 07/09/2008   Pneumococcal Conjugate-13 12/20/2013   Pneumococcal Polysaccharide-23 08/17/2012   Zoster Recombinant(Shingrix) 01/30/2014, 12/18/2020, 04/18/2021   Pertinent  Health Maintenance Due  Topic Date Due   INFLUENZA VACCINE  03/03/2023   DEXA SCAN  Completed      07/22/2022    2:05 PM 11/23/2022   10:44 AM 01/31/2023    1:33 PM 02/02/2023   11:44 AM 03/22/2023    8:19 AM  Fall Risk  Falls in the past year? 1 0 1 1 0  Was there an injury with Fall? 1 0 1 0 0  Fall Risk Category Calculator 3 0 2 2 0  Fall Risk Category (Retired) High      (RETIRED) Patient Fall Risk Level Moderate fall risk      Patient at Risk for Falls Due to History of fall(s) History of fall(s) History of fall(s);Impaired balance/gait;Impaired mobility History of fall(s);Impaired balance/gait;Impaired mobility History of fall(s);Impaired balance/gait;Impaired mobility  Fall risk Follow up Falls evaluation completed  Falls evaluation completed Falls evaluation completed;Education provided;Falls prevention discussed Falls evaluation completed   Functional Status Survey:    Vitals:   05/16/23 1636  BP: (!) 155/92  Pulse: 84  Resp: 20  Temp: 97.6 F (36.4 C)  TempSrc: Temporal  SpO2: 97%  Weight: 172 lb 3.2 oz (78.1 kg)  Height: 5\' 2"  (1.575 m)   Body mass index is 31.5 kg/m. Physical Exam Vitals reviewed.  Constitutional:      Appearance: Normal appearance.  HENT:     Head: Normocephalic.     Nose: Nose normal.     Mouth/Throat:     Mouth: Mucous membranes are moist.     Pharynx: Oropharynx is clear.  Eyes:     Pupils: Pupils are equal, round, and reactive to light.  Cardiovascular:     Rate and Rhythm: Normal rate and regular rhythm.     Pulses: Normal pulses.     Heart sounds: Normal heart sounds. No murmur heard. Pulmonary:     Effort: Pulmonary effort is normal.     Breath sounds: Normal breath sounds.  Abdominal:     General: Abdomen is flat. Bowel sounds are  normal.     Palpations: Abdomen is soft.  Musculoskeletal:  Cervical back: Neck supple.  Skin:    General: Skin is warm.  Neurological:     General: No focal deficit present.     Mental Status: She is alert and oriented to person, place, and time.  Psychiatric:        Mood and Affect: Mood normal.        Thought Content: Thought content normal.     Labs reviewed: Recent Labs    11/23/22 0000  NA 136*  K 4.5  CL 98*  CO2 25*  BUN 15  CREATININE 0.8  CALCIUM 9.3   Recent Labs    11/23/22 0000  AST 20  ALT 17  ALKPHOS 95  ALBUMIN 4.6   No results for input(s): "WBC", "NEUTROABS", "HGB", "HCT", "MCV", "PLT" in the last 8760 hours. Lab Results  Component Value Date   TSH 2.09 11/23/2022   No results found for: "HGBA1C" Lab Results  Component Value Date   CHOL 191 01/26/2022   HDL 77 (A) 01/26/2022   LDLCALC 89 01/26/2022   TRIG 124 01/26/2022    Significant Diagnostic Results in last 30 days:  No results found.  Assessment/Plan  Recurrent UTI This is Patient 3rd Episode of Dysuria Her Culture is again negative She does have TNTC WBC She did take Antibiotics before the culture was done Her symptoms do improve on Cipro She already takes Cranberry juice Will also start her on Estrace Suppo I will also make referral to Urology as this can be due to her having incomplete Bladder emptying as before      Family/ staff Communication:  Labs/tests ordered:

## 2023-05-23 ENCOUNTER — Other Ambulatory Visit: Payer: Self-pay | Admitting: Internal Medicine

## 2023-05-23 DIAGNOSIS — Z1231 Encounter for screening mammogram for malignant neoplasm of breast: Secondary | ICD-10-CM

## 2023-05-24 ENCOUNTER — Ambulatory Visit
Admission: RE | Admit: 2023-05-24 | Discharge: 2023-05-24 | Disposition: A | Discharge status: Home / Self Care | Payer: Medicare HMO | Source: Ambulatory Visit | Attending: Internal Medicine | Admitting: Internal Medicine

## 2023-05-24 DIAGNOSIS — Z1231 Encounter for screening mammogram for malignant neoplasm of breast: Secondary | ICD-10-CM | POA: Diagnosis not present

## 2023-05-24 LAB — CBC AND DIFFERENTIAL
HCT: 36 (ref 36–46)
Hemoglobin: 12.5 (ref 12.0–16.0)
Platelets: 311 10*3/uL (ref 150–400)
WBC: 6.3

## 2023-05-24 LAB — CBC: RBC: 3.86 — AB (ref 3.87–5.11)

## 2023-05-31 ENCOUNTER — Encounter: Payer: Self-pay | Admitting: Internal Medicine

## 2023-05-31 ENCOUNTER — Non-Acute Institutional Stay: Payer: Medicare HMO | Admitting: Internal Medicine

## 2023-05-31 VITALS — BP 152/76 | HR 93 | Temp 97.7°F | Resp 17 | Ht 62.0 in | Wt 173.4 lb

## 2023-05-31 DIAGNOSIS — E7800 Pure hypercholesterolemia, unspecified: Secondary | ICD-10-CM | POA: Diagnosis not present

## 2023-05-31 DIAGNOSIS — F339 Major depressive disorder, recurrent, unspecified: Secondary | ICD-10-CM

## 2023-05-31 DIAGNOSIS — N390 Urinary tract infection, site not specified: Secondary | ICD-10-CM

## 2023-05-31 DIAGNOSIS — G8929 Other chronic pain: Secondary | ICD-10-CM

## 2023-05-31 DIAGNOSIS — E039 Hypothyroidism, unspecified: Secondary | ICD-10-CM | POA: Diagnosis not present

## 2023-05-31 DIAGNOSIS — M545 Low back pain, unspecified: Secondary | ICD-10-CM

## 2023-05-31 MED ORDER — METHOCARBAMOL 500 MG PO TABS: 250.0000 mg | ORAL_TABLET | Freq: Two times a day (BID) | ORAL | 0 refills | Status: DC | PRN

## 2023-05-31 MED ORDER — ESTROGENS CONJUGATED 0.625 MG/GM VA CREA: 1.0000 | TOPICAL_CREAM | VAGINAL | 12 refills | Status: DC

## 2023-05-31 MED ORDER — ESTRADIOL 0.1 MG/GM VA CREA: 1.0000 | TOPICAL_CREAM | VAGINAL | 12 refills | Status: DC

## 2023-05-31 NOTE — Progress Notes (Unsigned)
Location:  [facility] features editor of Service:  Clinic (12)  Provider:   Code Status:  Goals of Care:     05/31/2023   11:04 AM  Advanced Directives  Does Patient Have a Medical Advance Directive? Yes  Type of Advance Directive Healthcare Power of Attorney  Does patient want to make changes to medical advance directive? No - Patient declined  Copy of Healthcare Power of Attorney in Chart? Yes - validated most recent copy scanned in chart (See row information)     Chief Complaint  Patient presents with   Medical Management of Chronic Issues    Patient is being seen for follow up and discuss lab. Patient has a spot on right side of her face    HPI: Patient is a 88 y.o. female seen today for medical management of chronic diseases.    [place]ives in [place] in [place] Back Pain S/p Radio frequency procedure Back pain is back Wants to know if she can restart her robaxin Frequency and Cystitis  Recently again had negative culture but symptoms resolved with Cipro She had taken antibiotics fo the rTooth before hand She is trying Double Voiding which helps with her Incomlete Bladder  Still doe snto want to see Urology Left Hand Carpal Tunnel  S/p Surgery Incision has healed but she still doe snto have sensation back Also still has weakness in that hand Depression Continues to struggle with this On Cymbalta.  Does not want me to do anything else Sleep apnea Had study done 5 years ago.  She states her mask does not fit her anymore.Does not want to wear Mask anymore   Skin Spots on her breast [place] continues to be very active cognitively doing very well  Past Medical History:  Diagnosis Date   Allergy    Anemia    Anginal pain (HCC)    Anxiety    Arthritis    Atypical moles    Breast lump    left   Chronic kidney disease    Colon polyp    Depression    Diverticulitis    04-15-15 no recent issues   Dysrhythmia 01/2020   Edema    legs, hands   bilateral   GERD (gastroesophageal reflux disease)    hx of    Heart murmur 01/2020   High cholesterol    Per new patient form   Hypertension    Hypothyroidism    IBS (irritable bowel syndrome)    Per new patient form   Impaired hearing    bilaterally some hearing loss-no hearing aids   Lactose intolerance    Per new patient form   Macular degeneration    Per new patient form   Mild cognitive impairment with memory loss 08/02/2000   per Dr Loleta Chance, neuro, 04-15-15 no changes-"stable"   Recurrent upper respiratory infection (URI)    cold with sinus drainage x several months- saw PCP 10/08/11- chest x ray report on chart.  no fever,resolved, no recent issues as of 04-15-15.   Shoulder joint pain    04-07-15 "tripped-loss balance, fell awkwardly and "wrenched shoulder-left", Known "bad right shoulder"bone on bone"   Skin lesion of breast    hx of right lateral breast  lesion-tx. with multipe rounds oral antibiotic- resolved, no reoccurrence.   Sleep apnea    Thyroid disease    hypothyroidism    Past Surgical History:  Procedure Laterality Date   BREAST SURGERY  lumpectomy- left-benign   CATARACT EXTRACTION, BILATERAL Bilateral    CHOLECYSTECTOMY     laparoscopic   COLECTOMY  10/20/2011   sigmoid colectomy   COLONOSCOPY  2018   DG  BONE DENSITY ([facility] HX)  2021   Coralie Common, PA; [facility]/ Per new patient form   ESOPHAGOGASTRODUODENOSCOPY     EYE SURGERY     bilateral cataracts   HERNIA REPAIR     INCISIONAL HERNIA REPAIR N/A 01/12/2013   Procedure: LAPAROSCOPIC/OPEN REPAIR INCISIONAL HERNIA;  Surgeon: Mariella Saa, MD;  Location: [facility];  Service: General;  Laterality: N/A;  With Mesh   INCISIONAL HERNIA REPAIR N/A 04/23/2015   Procedure: LAPAROSCOPIC ASSISTED INCISIONAL VENTRAL HERNIA REPAIR;  Surgeon: Glenna Fellows, MD;  Location: [facility];  Service: General;  Laterality: N/A;   INSERTION OF MESH N/A 04/23/2015   Procedure: INSERTION OF MESH;  Surgeon: Glenna Fellows, MD;  Location: [facility];  Service: General;  Laterality: N/A;   JOINT REPLACEMENT  05/2021   left hip   KYPHOPLASTY N/A 08/19/2021   Procedure: KYPHOPLASTY THORACIC TWELVE, LUMBAR ONE;  Surgeon: Tressie Stalker, MD;  Location: [facility] OR;  Service: Neurosurgery;  Laterality: N/A;  KYPHOPLASTY THORACIC TWELVE, LUMBAR ONE   TOTAL HIP ARTHROPLASTY Left 2022   [facility], [facility]; Per new patient form   TUBAL LIGATION  1972    Allergies  Allergen Reactions   Penicillins Anaphylaxis   Codeine     Makes pt overly excited.... Anxious/ hyperactive    Nsaids     Burns stomach     Outpatient Encounter Medications as of 05/31/2023  Medication Sig   albuterol (VENTOLIN HFA) 108 (90 Base) MCG/ACT inhaler Inhale 2 puffs into the lungs every 6 (six) hours as needed for wheezing or shortness of breath.   amLODipine (NORVASC) 5 MG tablet Take 1 tablet (5 mg total) by mouth daily.   Calcium Carb-Cholecalciferol (CALCIUM 600 + D PO) Take 1 tablet by mouth daily.   Carboxymethylcellulose Sodium (LUBRICANT EYE DROPS OP) Apply 1 drop to eye as needed.   celecoxib (CELEBREX) 100 MG capsule TAKE 1 CAPSULE BY MOUTH 2 TIMES DAILY AS NEEDED FOR MODERATE PAIN.   clonazePAM (KLONOPIN) 0.5 MG tablet TAKE 1 TABLET BY MOUTH 2 TIMES DAILY AS NEEDED FOR ANXIETY.   cyanocobalamin 100 MCG tablet Take 100 mcg by mouth daily.   DULoxetine (CYMBALTA) 60 MG capsule Take 1 capsule (60 mg total) by mouth daily.   [START ON 06/02/2023] estradiol (ESTRACE) 0.1 MG/GM vaginal cream Place 1 Applicatorful vaginally 2 (two) times a week.   hyoscyamine (LEVSIN SL) 0.125 MG SL tablet Place 1 tablet (0.125 mg total) under the tongue daily as needed for cramping.   levothyroxine (SYNTHROID) 50 MCG tablet Take 1 tablet (50 mcg total) by mouth daily before breakfast.   loratadine (CLARITIN) 10 MG tablet Take 10 mg by mouth daily.   Multiple Vitamins-Minerals (PRESERVISION AREDS 2+MULTI VIT PO) Take 1 capsule by  mouth daily.   omeprazole (PRILOSEC) 20 MG capsule TAKE 1 CAPSULE BY MOUTH EVERY DAY   polyethylene glycol (MIRALAX / GLYCOLAX) 17 g packet Take 17 g by mouth daily as needed.   rosuvastatin (CRESTOR) 5 MG tablet Take 1 tablet (5 mg total) by mouth daily. (Patient taking differently: Take 5 mg by mouth every other day.)   traMADol (ULTRAM) 50 MG tablet Take 50 mg by mouth 2 (two) times daily as needed.   triamterene-hydrochlorothiazide (DYAZIDE) 37.5-25 MG capsule TAKE 1 EACH (1 CAPSULE  TOTAL) BY MOUTH EVERY MORNING.   [DISCONTINUED] ciprofloxacin (CIPRO) 500 MG tablet Take 500 mg by mouth 2 (two) times daily.   [DISCONTINUED] conjugated estrogens (PREMARIN) vaginal cream Place 1 Applicatorful vaginally 2 (two) times a week.   [DISCONTINUED] estradiol (ESTRACE VAGINAL) 0.1 MG/GM vaginal cream Place 1 Applicatorful vaginally 2 (two) times a week.   [DISCONTINUED] methocarbamol (ROBAXIN) 500 MG tablet Take 0.5 tablets (250 mg total) by mouth 2 (two) times daily as needed for muscle spasms.   methocarbamol (ROBAXIN) 500 MG tablet Take 0.5 tablets (250 mg total) by mouth 2 (two) times daily as needed for muscle spasms.   No facility-administered encounter medications on file as of 05/31/2023.    Review of Systems:  Review of Systems  Constitutional:  Negative for activity change and appetite change.  HENT: Negative.    Respiratory:  Negative for cough and shortness of breath.   Cardiovascular:  Negative for leg swelling.  Gastrointestinal:  Negative for constipation.  Genitourinary:  Positive for frequency.  Musculoskeletal:  Positive for back pain and gait problem. Negative for arthralgias and myalgias.  Skin: Negative.   Neurological:  Negative for dizziness and weakness.  Psychiatric/Behavioral:  Negative for confusion, dysphoric mood and sleep disturbance.     Health Maintenance  Topic Date Due   DTaP/Tdap/Td (1 - Tdap) Never done   INFLUENZA VACCINE  03/03/2023   COVID-19 Vaccine (7  - 2023-24 season) 04/03/2023   Medicare Annual Wellness (AWV)  01/31/2024   Pneumonia Vaccine 25+ Years old  Completed   DEXA SCAN  Completed   Zoster Vaccines- Shingrix  Completed   HPV VACCINES  Aged Out    Physical Exam: Vitals:   05/31/23 1103 05/31/23 1106  BP: (!) 150/78 (!) 152/76  Pulse: 93   Resp: 17   Temp: 97.7 F (36.5 C)   TempSrc: Temporal   SpO2: 98%   Weight: 173 lb 6.4 oz (78.7 kg)   Height: 5\' 2"  (1.575 m)    Body mass index is 31.72 kg/m. Physical Exam Vitals reviewed.  Constitutional:      Appearance: Normal appearance.  HENT:     Head: Normocephalic.     Nose: Nose normal.     Mouth/Throat:     Mouth: Mucous membranes are moist.     Pharynx: Oropharynx is clear.  Eyes:     Pupils: Pupils are equal, round, and reactive to light.  Cardiovascular:     Rate and Rhythm: Normal rate and regular rhythm.     Pulses: Normal pulses.     Heart sounds: Normal heart sounds. No murmur heard. Pulmonary:     Effort: Pulmonary effort is normal.     Breath sounds: Normal breath sounds.  Abdominal:     General: Abdomen is flat. Bowel sounds are normal.     Palpations: Abdomen is soft.  Musculoskeletal:        General: No swelling.     Cervical back: Neck supple.  Skin:    General: Skin is warm.  Neurological:     General: No focal deficit present.     Mental Status: She is alert and oriented to person, place, and time.  Psychiatric:        Mood and Affect: Mood normal.        Thought Content: Thought content normal.     Labs reviewed: Basic Metabolic Panel: Recent Labs    11/23/22 0000  NA 136*  K 4.5  CL 98*  CO2 25*  BUN 15  CREATININE 0.8  CALCIUM 9.3  TSH 2.09   Liver Function Tests: Recent Labs    11/23/22 0000  AST 20  ALT 17  ALKPHOS 95  ALBUMIN 4.6   No results for input(s): "LIPASE", "AMYLASE" in the last 8760 hours. No results for input(s): "AMMONIA" in the last 8760 hours. CBC: Recent Labs    05/24/23 0000  WBC 6.3   HGB 12.5  HCT 36  PLT 311   Lipid Panel: No results for input(s): "CHOL", "HDL", "LDLCALC", "TRIG", "CHOLHDL", "LDLDIRECT" in the last 8760 hours. No results found for: "HGBA1C"  Procedures since last visit: MM 3D SCREENING MAMMOGRAM BILATERAL BREAST  Result Date: 05/26/2023 CLINICAL DATA:  Screening. EXAM: DIGITAL SCREENING BILATERAL MAMMOGRAM WITH TOMOSYNTHESIS AND CAD TECHNIQUE: Bilateral screening digital craniocaudal and mediolateral oblique mammograms were obtained. Bilateral screening digital breast tomosynthesis was performed. The images were evaluated with computer-aided detection. COMPARISON:  Previous exam(s). ACR Breast Density Category a: The breasts are almost entirely fatty. FINDINGS: There are no findings suspicious for malignancy. IMPRESSION: No mammographic evidence of malignancy. A result letter of this screening mammogram will be mailed directly to the patient. RECOMMENDATION: Screening mammogram in one year. (Code:SM-B-01Y) BI-RADS CATEGORY  1: Negative. Electronically Signed   By: Amie Portland M.D.   On: 05/26/2023 10:13    Assessment/Plan 1. Recurrent UTI Start on Estrace Cream Already takes Cranberry Does not want to see Urology Yet Trying Double Voiding to help with better Emptying of Bladder  2. Chronic bilateral low back pain without sciatica Will Restart her Robaxin She is already on Celebrex, Cymbalta Tylenol and PRN tramadol  3. Hypothyroidism, unspecified type TSH normal in/ 4/24  4. Depression, recurrent (HCC) Cymbalta and Klonipin  5. Pure hypercholesterolemia Statin She wants to reduce the dose to every other day will change it for her   6 Skin spot Aging spots Breast. Face and Forehead She has Appointment to see Derm  Labs/tests ordered:   Next appt:  05/31/2023

## 2023-06-13 DIAGNOSIS — H524 Presbyopia: Secondary | ICD-10-CM | POA: Diagnosis not present

## 2023-06-22 DIAGNOSIS — L570 Actinic keratosis: Secondary | ICD-10-CM | POA: Diagnosis not present

## 2023-06-22 DIAGNOSIS — L820 Inflamed seborrheic keratosis: Secondary | ICD-10-CM | POA: Diagnosis not present

## 2023-06-23 ENCOUNTER — Other Ambulatory Visit: Payer: Self-pay | Admitting: Internal Medicine

## 2023-06-23 DIAGNOSIS — S32010D Wedge compression fracture of first lumbar vertebra, subsequent encounter for fracture with routine healing: Secondary | ICD-10-CM

## 2023-07-04 ENCOUNTER — Encounter: Payer: Self-pay | Admitting: Physician Assistant

## 2023-07-04 ENCOUNTER — Ambulatory Visit: Payer: Medicare HMO | Admitting: Physician Assistant

## 2023-07-04 ENCOUNTER — Other Ambulatory Visit (INDEPENDENT_AMBULATORY_CARE_PROVIDER_SITE_OTHER): Payer: Medicare HMO

## 2023-07-04 DIAGNOSIS — M25561 Pain in right knee: Secondary | ICD-10-CM | POA: Diagnosis not present

## 2023-07-04 MED ORDER — METHYLPREDNISOLONE ACETATE 40 MG/ML IJ SUSP
40.0000 mg | INTRAMUSCULAR | Status: AC | PRN
  Administered 2023-07-04: 40 mg via INTRA_ARTICULAR

## 2023-07-04 MED ORDER — LIDOCAINE HCL 1 % IJ SOLN
3.0000 mL | INTRAMUSCULAR | Status: AC | PRN
  Administered 2023-07-04: 3 mL

## 2023-07-04 NOTE — Progress Notes (Signed)
HPI: Sophia Nash comes in today due to right knee pain and swelling.  No particular injury.  She does state over the holiday last week she did go up and down stairs which was new for her.  She has global right knee pain.  She has tried a knee sleeve.  Ranks her pain to be 8 out of 10 pain at worst.  She has tried Celebrex tramadol leg strength Tylenol and ice without significant relief.  Review of systems: Negative for fevers chills.  Nondiabetic.  Physical exam: General: Patient is well-developed well-nourished female seated in wheelchair.  She is with able to transition to table with minimal assistance. Respirations: Unlabored  Bilateral knees: Good range of motion of both knees.  Right knee slight effusion left knee no effusion no abnormal warmth erythema of either knee.  Tenderness right knee lateral joint line.  McMurray's negative bilaterally.  Right knee patellofemoral crepitus  Radiographs: Right knee 2 views: Shows moderate patellofemoral arthritic changes.  Lateral compartment with lateral articular spurring and moderate narrowing.  Medial joint line overall well-preserved.  No acute fractures acute findings.  Impression: Acute right knee pain Right knee osteoarthritis  Plan: Given patient's acute onset of knee pain and slight effusion recommend aspiration and injection with cortisone.  She is agreeable.  Will see how she does with the injection.  Have her follow-up with [place] in 2 weeks see what type of results she had.  She will work on quad strengthening exercises as shown.  Questions were encouraged and answered at length.       Procedure Note  Patient: Sophia Nash             Date of Birth: 1934/12/04           MRN: [id number]             Visit Date: 07/04/2023  Procedures: Visit Diagnoses:  1. Acute pain of right knee     Large Joint Inj: R knee on 07/04/2023 5:32 PM Indications: pain Details: 22 G 1.5 in needle, superolateral approach  Arthrogram:  No  Medications: 3 mL lidocaine 1 %; 40 mg methylPREDNISolone acetate 40 MG/ML Aspirate: 7 mL yellow Outcome: tolerated well, no immediate complications Procedure, treatment alternatives, risks and benefits explained, specific risks discussed. Consent was given by the patient. Immediately prior to procedure a time out was called to verify the correct patient, procedure, equipment, support staff and site/side marked as required. Patient was prepped and draped in the usual sterile fashion.

## 2023-07-06 ENCOUNTER — Encounter: Payer: Medicare HMO | Admitting: Orthopaedic Surgery

## 2023-07-11 ENCOUNTER — Telehealth: Payer: Self-pay | Admitting: Orthopaedic Surgery

## 2023-07-11 NOTE — Telephone Encounter (Signed)
Patient called asked if she can get a copy of her X-Ray that was done of her left hip 12/20/2022 Patient said to call her when CD is ready. Patient said she will pick it up from the front desk. The number to contact patient is [phone]

## 2023-07-12 NOTE — Telephone Encounter (Signed)
Left voicemail advising patient CD was ready for pickup at front desk.  

## 2023-07-17 ENCOUNTER — Other Ambulatory Visit: Payer: Self-pay | Admitting: Adult Health

## 2023-07-17 DIAGNOSIS — I10 Essential (primary) hypertension: Secondary | ICD-10-CM

## 2023-07-18 DIAGNOSIS — S76019A Strain of muscle, fascia and tendon of unspecified hip, initial encounter: Secondary | ICD-10-CM | POA: Diagnosis not present

## 2023-07-18 DIAGNOSIS — M25552 Pain in left hip: Secondary | ICD-10-CM | POA: Diagnosis not present

## 2023-07-18 DIAGNOSIS — M7062 Trochanteric bursitis, left hip: Secondary | ICD-10-CM | POA: Diagnosis not present

## 2023-07-20 ENCOUNTER — Encounter: Payer: Medicare HMO | Admitting: Orthopaedic Surgery

## 2023-07-21 ENCOUNTER — Ambulatory Visit: Payer: Medicare HMO | Admitting: Physician Assistant

## 2023-07-21 ENCOUNTER — Encounter: Payer: Self-pay | Admitting: Physician Assistant

## 2023-07-21 DIAGNOSIS — M1711 Unilateral primary osteoarthritis, right knee: Secondary | ICD-10-CM

## 2023-07-21 NOTE — Progress Notes (Signed)
Office Visit Note   Patient: Sophia Nash           Date of Birth: 03/28/1935           MRN: [id number] Visit Date: 07/21/2023              Requested by: Mahlon Gammon, MD [address] [place],  [place] [address] PCP: Mahlon Gammon, MD  Chief Complaint  Patient presents with   Right Knee - Pain      HPI: Patient is a pleasant 88 year old woman who is a patient of Gill [facility]'s.  She has osteoarthritis of her patellofemoral joint.  She was injected a couple weeks ago.  She does say that it helped a little bit but she thinks she has had a little of the fluid return.  Denies any fever or chills.  She is due to start physical therapy in a couple weeks  Assessment & Plan: Visit Diagnoses: Osteoarthritis right knee  Plan: Her exam is benign today she may have a recurrent small effusion.  I explained to her that at this point I think physical therapy would benefit her the most.  Her compartments are soft she has swelling in the knee but no redness negative Homans' sign.  She will follow-up with Delane Ginger after physical therapy  Follow-Up Instructions: After physical therapy  Ortho Exam  Patient is alert, oriented, no adenopathy, well-dressed, normal affect, normal respiratory effort. Right knee mild effusion she is neurovascular intact she does have grinding with range of motion no medial lateral joint pain.  She does have varicosities in the lower leg but no redness no cellulitis compartments are soft negative Denna Haggard' sign she is neurovascular intact  Imaging: No results found. No images are attached to the encounter.  Labs: Lab Results  Component Value Date   REPTSTATUS 08/12/2021 FINAL 08/11/2021   CULT  08/11/2021    NO GROWTH Performed at [facility], [address], [place], [place] [address]      Lab Results  Component Value Date   ALBUMIN 4.6 11/23/2022   ALBUMIN 4.3 04/14/2022   ALBUMIN 4.5 01/26/2022    No results found for: "MG" Lab Results   Component Value Date   VD25OH 80.0 07/22/2021    No results found for: "PREALBUMIN"    Latest Ref Rng & Units 05/24/2023   12:00 AM 04/14/2022   12:00 AM 01/26/2022   12:00 AM  CBC EXTENDED  WBC  6.3     7.1     7.2      RBC 3.87 - 5.11 3.86     3.83     4.23      Hemoglobin 12.0 - 16.0 12.5     12.8     13.6      HCT 36 - 46 36     37     40      Platelets 150 - 400 K/uL 311     283     296         This result is from an external source.     There is no height or weight on file to calculate BMI.  Orders:  No orders of the defined types were placed in this encounter.  No orders of the defined types were placed in this encounter.    Procedures: No procedures performed  Clinical Data: No additional findings.  ROS:  All other systems negative, except as noted in the HPI. Review  of Systems  Objective: Vital Signs: There were no vitals taken for this visit.  Specialty Comments:  No specialty comments available.  PMFS History: Patient Active Problem List   Diagnosis Date Noted   Carpal tunnel syndrome of left wrist 07/20/2022   S/P kyphoplasty 09/02/2021   Essential hypertension 09/02/2021   Pure hypercholesterolemia 09/02/2021   Depression, recurrent (HCC) 09/02/2021   Anxiety 09/02/2021   Gastroesophageal reflux disease without esophagitis 09/02/2021   Irritable bowel syndrome with diarrhea 09/02/2021   Hypothyroidism 09/02/2021   Recurrent ventral incisional hernia 04/23/2015   H/O ventral hernia repair 01/14/2013   Diverticulitis of colon 09/23/2011   Past Medical History:  Diagnosis Date   Allergy    Anemia    Anginal pain (HCC)    Anxiety    Arthritis    Atypical moles    Breast lump    left   Chronic kidney disease    Colon polyp    Depression    Diverticulitis    04-15-15 no recent issues   Dysrhythmia 01/2020   Edema    legs, hands  bilateral   GERD (gastroesophageal reflux disease)    hx of    Heart murmur 01/2020   High  cholesterol    Per new patient form   Hypertension    Hypothyroidism    IBS (irritable bowel syndrome)    Per new patient form   Impaired hearing    bilaterally some hearing loss-no hearing aids   Lactose intolerance    Per new patient form   Macular degeneration    Per new patient form   Mild cognitive impairment with memory loss 08/02/2000   per Dr Loleta Chance, neuro, 04-15-15 no changes-"stable"   Recurrent upper respiratory infection (URI)    cold with sinus drainage x several months- saw PCP 10/08/11- chest x ray report on chart.  no fever,resolved, no recent issues as of 04-15-15.   Shoulder joint pain    04-07-15 "tripped-loss balance, fell awkwardly and "wrenched shoulder-left", Known "bad right shoulder"bone on bone"   Skin lesion of breast    hx of right lateral breast  lesion-tx. with multipe rounds oral antibiotic- resolved, no reoccurrence.   Sleep apnea    Thyroid disease    hypothyroidism    Family History  Problem Relation Age of Onset   Cancer Mother        breast   Heart disease Father    Hypertension Sister    Dementia Sister    Cancer Brother        colon   Heart Problems Brother    High Cholesterol Daughter     Past Surgical History:  Procedure Laterality Date   BREAST SURGERY     lumpectomy- left-benign   CATARACT EXTRACTION, BILATERAL Bilateral    CHOLECYSTECTOMY     laparoscopic   COLECTOMY  10/20/2011   sigmoid colectomy   COLONOSCOPY  2018   DG  BONE DENSITY ([facility] HX)  2021   Coralie Common, PA; [facility]/ Per new patient form   ESOPHAGOGASTRODUODENOSCOPY     EYE SURGERY     bilateral cataracts   HERNIA REPAIR     INCISIONAL HERNIA REPAIR N/A 01/12/2013   Procedure: LAPAROSCOPIC/OPEN REPAIR INCISIONAL HERNIA;  Surgeon: Mariella Saa, MD;  Location: [facility];  Service: General;  Laterality: N/A;  With Mesh   INCISIONAL HERNIA REPAIR N/A 04/23/2015   Procedure: LAPAROSCOPIC ASSISTED INCISIONAL VENTRAL HERNIA REPAIR;  Surgeon: Glenna Fellows, MD;   Location: [facility];  Service: General;  Laterality: N/A;   INSERTION OF MESH N/A 04/23/2015   Procedure: INSERTION OF MESH;  Surgeon: Glenna Fellows, MD;  Location: [facility];  Service: General;  Laterality: N/A;   JOINT REPLACEMENT  05/2021   left hip   KYPHOPLASTY N/A 08/19/2021   Procedure: KYPHOPLASTY THORACIC TWELVE, LUMBAR ONE;  Surgeon: Tressie Stalker, MD;  Location: [facility] OR;  Service: Neurosurgery;  Laterality: N/A;  KYPHOPLASTY THORACIC TWELVE, LUMBAR ONE   TOTAL HIP ARTHROPLASTY Left 2022   [facility]; Per new patient form   TUBAL LIGATION  1972   Social History   Occupational History   Not on file  Tobacco Use   Smoking status: Never   Smokeless tobacco: Never  Vaping Use   Vaping status: Never Used  Substance and Sexual Activity   Alcohol use: No   Drug use: Never   Sexual activity: Not on file

## 2023-07-22 ENCOUNTER — Ambulatory Visit: Payer: Medicare HMO | Admitting: Physician Assistant

## 2023-07-28 DIAGNOSIS — M25561 Pain in right knee: Secondary | ICD-10-CM | POA: Diagnosis not present

## 2023-07-28 DIAGNOSIS — G8929 Other chronic pain: Secondary | ICD-10-CM | POA: Diagnosis not present

## 2023-07-30 IMAGING — RF DG THORACOLUMBAR SPINE 2V
1 series · 1 of 1 positions shown · non-contrast
Comparison: Radiographs of the thoracolumbar spine 07/14/2021.
Lumbar MRI 05/07/2021.

CLINICAL DATA: Kyphoplasty at T12 and L1.

EXAM:
THORACOLUMBAR SPINE 1V

[Series 1: run · 1 of 1 slices shown]
[im 1/1]
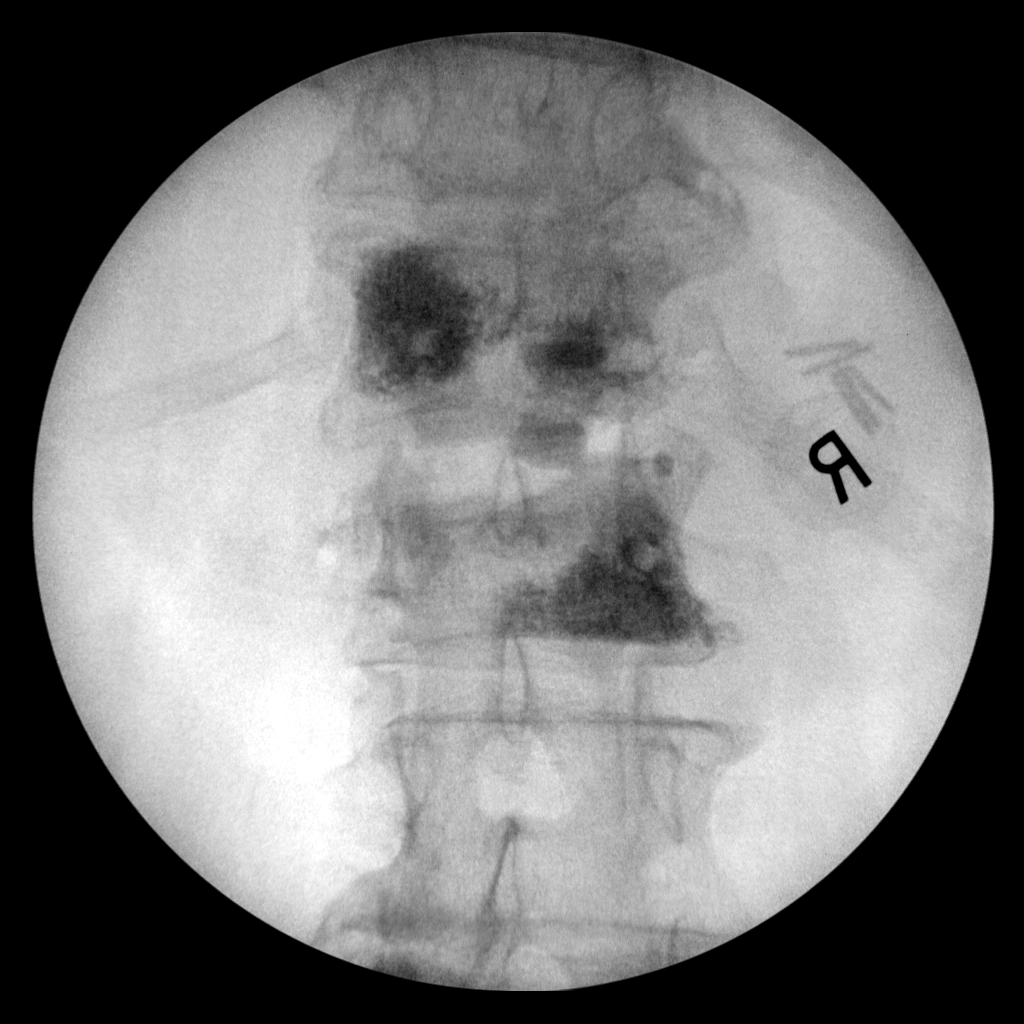

[1 of 1 positions shown; findings below may reference images not displayed]

FINDINGS: C-arm fluoroscopy provided in the operating [HOSPITAL] minutes and 43
seconds of fluoroscopy time. 113.88 mGy.

Two spot fluoroscopic images are submitted, demonstrating spinal
augmentation changes within the T12 and L1 vertebral bodies. A small
amount of the methylmethacrylate appears to extend into the T12-L1
disc, although no intraspinal extension is identified.
IMPRESSION: Intraoperative views following spinal augmentation at T12 and L1.

## 2023-08-04 ENCOUNTER — Ambulatory Visit: Payer: Medicare HMO | Admitting: Physician Assistant

## 2023-08-04 ENCOUNTER — Telehealth: Payer: Self-pay

## 2023-08-04 ENCOUNTER — Encounter: Payer: Self-pay | Admitting: Physician Assistant

## 2023-08-04 DIAGNOSIS — M1711 Unilateral primary osteoarthritis, right knee: Secondary | ICD-10-CM | POA: Diagnosis not present

## 2023-08-04 NOTE — Telephone Encounter (Signed)
 Requests visco supplementation for right knee OA

## 2023-08-04 NOTE — Progress Notes (Signed)
 Office Visit Note   Patient: Sophia Nash           Date of Birth: 1935/02/16           MRN: [id number] Visit Date: 08/04/2023              Requested by: Charlanne Fredia CROME, MD [address] [place],  [place] [address] PCP: Charlanne Fredia CROME, MD  Chief Complaint  Patient presents with   Right Knee - Pain      HPI: Patient is a pleasant 89 year old woman with a history of right knee arthritis has had hip replacement with Dr. Vernetta on the left.  She has been seen by Marvis for right knee pain.  She has had injections.  She at her last visit with me I recommended physical therapy.  She does live in an assisted living.  She comes in with her daughter today.  She has not done PT because her daughter says her knee just hurts too much.  She does take Celebrex  and tramadol .  Denies any fever or chills   Assessment & Plan: Visit Diagnoses: Osteoarthritis right knee  Plan: I had a long discussion with the patient and her daughter.  They asked about gel injections certainly happy to try and authorize this.  Steroid ejections have not been helpful.  They question whether this could be coming from the hip though on exam today she has good motion of her hip with no reproduction of pain.  I again would like them to work with physical therapy to give her better strength.  She also inquired about her motorized wheelchair and authorization.  I recommended they begin this authorization with her primary care provider who has better knowledge of her total health profile.  Have encouraged her again to follow-up with her regular physician Dr. Vernetta or Marvis  Follow-Up Instructions: With Dr. Vernetta Beers Exam  Patient is alert, oriented, no adenopathy, well-dressed, normal affect, normal respiratory effort. Right knee she is neurovascular intact very small effusion knee is cool without any erythema compartments are soft and compressible negative Homans' sign.  She has more tenderness laterally than  medially  Imaging: No results found. No images are attached to the encounter.  Labs: Lab Results  Component Value Date   REPTSTATUS 08/12/2021 FINAL 08/11/2021   CULT  08/11/2021    NO GROWTH Performed at [facility], [address] [place], [place] [address]      Lab Results  Component Value Date   ALBUMIN 4.6 11/23/2022   ALBUMIN 4.3 04/14/2022   ALBUMIN 4.5 01/26/2022    No results found for: MG Lab Results  Component Value Date   VD25OH 80.0 07/22/2021    No results found for: PREALBUMIN    Latest Ref Rng & Units 05/24/2023   12:00 AM 04/14/2022   12:00 AM 01/26/2022   12:00 AM  CBC EXTENDED  WBC  6.3     7.1     7.2      RBC 3.87 - 5.11 3.86     3.83     4.23      Hemoglobin 12.0 - 16.0 12.5     12.8     13.6      HCT 36 - 46 36     37     40      Platelets 150 - 400 K/uL 311     283     296  This result is from an external source.     There is no height or weight on file to calculate BMI.  Orders:  No orders of the defined types were placed in this encounter.  No orders of the defined types were placed in this encounter.    Procedures: No procedures performed  Clinical Data: No additional findings.  ROS:  All other systems negative, except as noted in the HPI. Review of Systems  Objective: Vital Signs: There were no vitals taken for this visit.  Specialty Comments:  No specialty comments available.  PMFS History: Patient Active Problem List   Diagnosis Date Noted   Carpal tunnel syndrome of left wrist 07/20/2022   S/P kyphoplasty 09/02/2021   Essential hypertension 09/02/2021   Pure hypercholesterolemia 09/02/2021   Depression, recurrent (HCC) 09/02/2021   Anxiety 09/02/2021   Gastroesophageal reflux disease without esophagitis 09/02/2021   Irritable bowel syndrome with diarrhea 09/02/2021   Hypothyroidism 09/02/2021   Recurrent ventral incisional hernia 04/23/2015   H/O ventral hernia repair 01/14/2013    Diverticulitis of colon 09/23/2011   Past Medical History:  Diagnosis Date   Allergy    Anemia    Anginal pain (HCC)    Anxiety    Arthritis    Atypical moles    Breast lump    left   Chronic kidney disease    Colon polyp    Depression    Diverticulitis    04-15-15 no recent issues   Dysrhythmia 01/2020   Edema    legs, hands  bilateral   GERD (gastroesophageal reflux disease)    hx of    Heart murmur 01/2020   High cholesterol    Per new patient form   Hypertension    Hypothyroidism    IBS (irritable bowel syndrome)    Per new patient form   Impaired hearing    bilaterally some hearing loss-no hearing aids   Lactose intolerance    Per new patient form   Macular degeneration    Per new patient form   Mild cognitive impairment with memory loss 08/02/2000   per Dr Leigh, neuro, 04-15-15 no changes-stable   Recurrent upper respiratory infection (URI)    cold with sinus drainage x several months- saw PCP 10/08/11- chest x ray report on chart.  no fever,resolved, no recent issues as of 04-15-15.   Shoulder joint pain    04-07-15 tripped-loss balance, fell awkwardly and wrenched shoulder-left, Known bad right shoulderbone on bone   Skin lesion of breast    hx of right lateral breast  lesion-tx. with multipe rounds oral antibiotic- resolved, no reoccurrence.   Sleep apnea    Thyroid  disease    hypothyroidism    Family History  Problem Relation Age of Onset   Cancer Mother        breast   Heart disease Father    Hypertension Sister    Dementia Sister    Cancer Brother        colon   Heart Problems Brother    High Cholesterol Daughter     Past Surgical History:  Procedure Laterality Date   BREAST SURGERY     lumpectomy- left-benign   CATARACT EXTRACTION, BILATERAL Bilateral    CHOLECYSTECTOMY     laparoscopic   COLECTOMY  10/20/2011   sigmoid colectomy   COLONOSCOPY  2018   DG  BONE DENSITY ([facility] HX)  2021   Damien Gander, PA; [facility]/ Per new patient  form   ESOPHAGOGASTRODUODENOSCOPY  EYE SURGERY     bilateral cataracts   HERNIA REPAIR     INCISIONAL HERNIA REPAIR N/A 01/12/2013   Procedure: LAPAROSCOPIC/OPEN REPAIR INCISIONAL HERNIA;  Surgeon: Morene ONEIDA Olives, MD;  Location: [facility];  Service: General;  Laterality: N/A;  With Mesh   INCISIONAL HERNIA REPAIR N/A 04/23/2015   Procedure: LAPAROSCOPIC ASSISTED INCISIONAL VENTRAL HERNIA REPAIR;  Surgeon: Morene Olives, MD;  Location: [facility];  Service: General;  Laterality: N/A;   INSERTION OF MESH N/A 04/23/2015   Procedure: INSERTION OF MESH;  Surgeon: Morene Olives, MD;  Location: [facility];  Service: General;  Laterality: N/A;   JOINT REPLACEMENT  05/2021   left hip   KYPHOPLASTY N/A 08/19/2021   Procedure: KYPHOPLASTY THORACIC TWELVE, LUMBAR ONE;  Surgeon: Mavis Purchase, MD;  Location: [facility];  Service: Neurosurgery;  Laterality: N/A;  KYPHOPLASTY THORACIC TWELVE, LUMBAR ONE   TOTAL HIP ARTHROPLASTY Left 2022   [facility], [facility]; Per new patient form   TUBAL LIGATION  1972   Social History   Occupational History   Not on file  Tobacco Use   Smoking status: Never   Smokeless tobacco: Never  Vaping Use   Vaping status: Never Used  Substance and Sexual Activity   Alcohol use: No   Drug use: Never   Sexual activity: Not on file

## 2023-08-10 NOTE — Telephone Encounter (Signed)
 VOB submitted for Durolane, right knee.

## 2023-08-18 LAB — COMPREHENSIVE METABOLIC PANEL
Albumin: 3.9 (ref 3.5–5.0)
Calcium: 8.8 (ref 8.7–10.7)
Globulin: 1.7
eGFR: 63

## 2023-08-18 LAB — BASIC METABOLIC PANEL
BUN: 17 (ref 4–21)
CO2: 27 — AB (ref 13–22)
Chloride: 97 — AB (ref 99–108)
Creatinine: 0.9 (ref 0.5–1.1)
Glucose: 96
Potassium: 4.2 meq/L (ref 3.5–5.1)
Sodium: 134 — AB (ref 137–147)

## 2023-08-18 LAB — CBC AND DIFFERENTIAL
HCT: 35 — AB (ref 36–46)
Hemoglobin: 12.3 (ref 12.0–16.0)
Platelets: 295 10*3/uL (ref 150–400)
WBC: 6.2

## 2023-08-18 LAB — LIPID PANEL
Cholesterol: 179 (ref 0–200)
HDL: 57 (ref 35–70)
LDL Cholesterol: 92
LDl/HDL Ratio: 3.2
Triglycerides: 152 (ref 40–160)

## 2023-08-18 LAB — HEPATIC FUNCTION PANEL
ALT: 14 U/L (ref 7–35)
AST: 15 (ref 13–35)
Alkaline Phosphatase: 109 (ref 25–125)
Bilirubin, Total: 0.3

## 2023-08-18 LAB — CBC: RBC: 3.78 — AB (ref 3.87–5.11)

## 2023-08-21 ENCOUNTER — Other Ambulatory Visit: Payer: Self-pay | Admitting: Internal Medicine

## 2023-08-21 DIAGNOSIS — K219 Gastro-esophageal reflux disease without esophagitis: Secondary | ICD-10-CM

## 2023-08-25 ENCOUNTER — Other Ambulatory Visit: Payer: Self-pay

## 2023-08-25 DIAGNOSIS — M1711 Unilateral primary osteoarthritis, right knee: Secondary | ICD-10-CM

## 2023-08-26 ENCOUNTER — Telehealth: Payer: Self-pay | Admitting: Radiology

## 2023-08-26 NOTE — Telephone Encounter (Signed)
Patient's daughter, Rosey Bath, called triage line. Patient is scheduled to come in for an appointment for gel injection with Dr. Magnus Ivan on Monday. She has currently been getting physical therapy through assisted living per [facility] request.  Rosey Bath said that the physical therapist worked with her mom yesterday and feels that there is something else going on and that she may have a tear. Her knee is giving out on her and she describes excruciating pain when putting weight on that leg.  Rosey Bath is not sure if you want patient to have other imaging or ultrasound prior to gel injection.  Please advise.  [phone]  Message being sent to [place] who last treated patient, as well as Morrie Sheldon as appt is with Dr. Magnus Ivan on Monday.

## 2023-08-26 NOTE — Telephone Encounter (Signed)
Spoke with patient and let her know that he will discuss all the variable and situations at her appt Monday

## 2023-08-29 ENCOUNTER — Encounter: Payer: Self-pay | Admitting: Orthopaedic Surgery

## 2023-08-29 ENCOUNTER — Ambulatory Visit (INDEPENDENT_AMBULATORY_CARE_PROVIDER_SITE_OTHER): Payer: Medicare HMO | Admitting: Orthopaedic Surgery

## 2023-08-29 DIAGNOSIS — M1711 Unilateral primary osteoarthritis, right knee: Secondary | ICD-10-CM

## 2023-08-29 MED ORDER — SODIUM HYALURONATE 60 MG/3ML IX PRSY
60.0000 mg | PREFILLED_SYRINGE | INTRA_ARTICULAR | Status: AC | PRN
  Administered 2023-08-29: 60 mg via INTRA_ARTICULAR

## 2023-08-29 NOTE — Progress Notes (Signed)
The patient comes in today with her daughter.  She is 89 year old and is still struggling with right knee pain.  Back in December Gil did aspirate a large fluid collection from her knee and placed a steroid injection to treat the pain from osteoarthritis of the knee.  She is now wearing a knee brace but having problems of the knee giving out on her.  She has remote history of a left hip replacement.  She stays at nursing facility and physical therapy and OT have been ordered.  I have encouraged her to let them work with her on her balance and coordination as well as strengthening of her lower extremities and her ADLs.  She has been wearing a knee brace and I looked in the brace nothing this is an appropriate brace as well.  She is scheduled today to have hyaluronic acid for her right knee with Durolane.  Her right knee does have significant lateral joint line tenderness and pain throughout the arc of motion of the knee.  Her right hip actually move smoothly and fluidly.  Previous x-rays of the right knee shows slight valgus malalignment with arthritis of the lateral compartment and patellofemoral joint.  I did place hyaluronic acid today in her right knee without difficulty.  Prior to placing the injection in her knee I did aspirate about 20 cc of fluid from the knee as well.  She and her daughter would like her to have MRI of her right knee to rule out a ligamentous tear or disruption given the fact that her knee gives out on her so much.  We can also assess the cartilage and then make a determination will see her back in 4 weeks what the best treatment plan would be.  Therapy can still continue to work with her as well.    Procedure Note  Patient: Sophia Nash             Date of Birth: 1934/08/25           MRN: [id number]             Visit Date: 08/29/2023  Procedures: Visit Diagnoses:  1. Unilateral primary osteoarthritis, right knee     Large Joint Inj: R knee on 08/29/2023 3:32  PM Indications: diagnostic evaluation and pain Details: 22 G 1.5 in needle, superolateral approach  Arthrogram: No  Medications: 60 mg Sodium Hyaluronate 60 MG/3ML Outcome: tolerated well, no immediate complications Procedure, treatment alternatives, risks and benefits explained, specific risks discussed. Consent was given by the patient. Immediately prior to procedure a time out was called to verify the correct patient, procedure, equipment, support staff and site/side marked as required. Patient was prepped and draped in the usual sterile fashion.

## 2023-08-30 ENCOUNTER — Encounter: Payer: Medicare HMO | Admitting: Internal Medicine

## 2023-08-30 ENCOUNTER — Other Ambulatory Visit: Payer: Self-pay

## 2023-08-30 DIAGNOSIS — M1711 Unilateral primary osteoarthritis, right knee: Secondary | ICD-10-CM

## 2023-09-01 ENCOUNTER — Other Ambulatory Visit: Payer: Self-pay | Admitting: Adult Health

## 2023-09-01 DIAGNOSIS — F339 Major depressive disorder, recurrent, unspecified: Secondary | ICD-10-CM

## 2023-09-05 ENCOUNTER — Encounter: Payer: Self-pay | Admitting: Orthopaedic Surgery

## 2023-09-05 DIAGNOSIS — M25561 Pain in right knee: Secondary | ICD-10-CM | POA: Diagnosis not present

## 2023-09-05 DIAGNOSIS — S76012A Strain of muscle, fascia and tendon of left hip, initial encounter: Secondary | ICD-10-CM | POA: Diagnosis not present

## 2023-09-05 DIAGNOSIS — R262 Difficulty in walking, not elsewhere classified: Secondary | ICD-10-CM | POA: Diagnosis not present

## 2023-09-05 DIAGNOSIS — M25552 Pain in left hip: Secondary | ICD-10-CM | POA: Diagnosis not present

## 2023-09-06 ENCOUNTER — Telehealth: Payer: Self-pay | Admitting: Orthopaedic Surgery

## 2023-09-06 ENCOUNTER — Other Ambulatory Visit: Payer: Self-pay | Admitting: Orthopaedic Surgery

## 2023-09-06 MED ORDER — TRAMADOL HCL 50 MG PO TABS: 50.0000 mg | ORAL_TABLET | Freq: Two times a day (BID) | ORAL | 0 refills | Status: DC | PRN

## 2023-09-06 NOTE — Telephone Encounter (Signed)
Patient called advised she is having pain in her right knee. Patient asked if she could get something called in for pain?  [facility] [address]The number to contact patient is [phone]

## 2023-09-07 NOTE — Telephone Encounter (Signed)
 LMOM for patient this was called in for her

## 2023-09-10 ENCOUNTER — Ambulatory Visit
Admission: RE | Admit: 2023-09-10 | Discharge: 2023-09-10 | Disposition: A | Discharge status: Home / Self Care | Payer: Medicare HMO | Source: Ambulatory Visit | Attending: Orthopaedic Surgery | Admitting: Orthopaedic Surgery

## 2023-09-10 DIAGNOSIS — M84451A Pathological fracture, right femur, initial encounter for fracture: Secondary | ICD-10-CM | POA: Diagnosis not present

## 2023-09-10 DIAGNOSIS — M1711 Unilateral primary osteoarthritis, right knee: Secondary | ICD-10-CM

## 2023-09-10 DIAGNOSIS — M25561 Pain in right knee: Secondary | ICD-10-CM | POA: Diagnosis not present

## 2023-09-10 DIAGNOSIS — M23321 Other meniscus derangements, posterior horn of medial meniscus, right knee: Secondary | ICD-10-CM | POA: Diagnosis not present

## 2023-09-10 DIAGNOSIS — M23351 Other meniscus derangements, posterior horn of lateral meniscus, right knee: Secondary | ICD-10-CM | POA: Diagnosis not present

## 2023-09-13 ENCOUNTER — Non-Acute Institutional Stay: Payer: Medicare HMO | Admitting: Internal Medicine

## 2023-09-13 VITALS — BP 160/80 | HR 81 | Temp 98.4°F | Resp 17 | Ht 62.0 in | Wt 172.2 lb

## 2023-09-13 DIAGNOSIS — F339 Major depressive disorder, recurrent, unspecified: Secondary | ICD-10-CM

## 2023-09-13 DIAGNOSIS — M545 Low back pain, unspecified: Secondary | ICD-10-CM

## 2023-09-13 DIAGNOSIS — G8929 Other chronic pain: Secondary | ICD-10-CM | POA: Diagnosis not present

## 2023-09-13 DIAGNOSIS — F419 Anxiety disorder, unspecified: Secondary | ICD-10-CM

## 2023-09-13 DIAGNOSIS — N390 Urinary tract infection, site not specified: Secondary | ICD-10-CM | POA: Diagnosis not present

## 2023-09-13 DIAGNOSIS — E7800 Pure hypercholesterolemia, unspecified: Secondary | ICD-10-CM

## 2023-09-13 DIAGNOSIS — E039 Hypothyroidism, unspecified: Secondary | ICD-10-CM

## 2023-09-13 MED ORDER — AMLODIPINE BESYLATE 2.5 MG PO TABS
2.5000 mg | ORAL_TABLET | Freq: Every day | ORAL | 3 refills | Status: DC
Start: 2023-09-13 — End: 2023-12-12

## 2023-09-13 NOTE — Progress Notes (Signed)
 Location:  [facility] features editor of Service:  Clinic (12)  Provider:   Code Status:  Goals of Care:     09/13/2023    2:46 PM  Advanced Directives  Does Patient Have a Medical Advance Directive? Yes  Type of Estate agent of [place];Living will  Does patient want to make changes to medical advance directive? No - Patient declined  Copy of Healthcare Power of Attorney in Chart? Yes - validated most recent copy scanned in chart (See row information)     Chief Complaint  Patient presents with   Medical Management of Chronic Issues    Patient is being seen for a 3 month follow up. Patient would like to discuss cream , discuss medication for cholesterol    HPI: Patient is a 89 y.o. female seen today for medical management of chronic diseases.   Lives in [place] in [place] Back Pain S/p Radio frequency procedure Left Hand Carpal Tunnel  S/p Surgery Recurent UTI HTN and HLD Depression and Anxiety Discussed the use of AI scribe software for clinical note transcription with the patient, who gave verbal consent to proceed.  History of Present Illness   Sophia Nash is a 89 year old female who presents with knee pain and swelling.  She has been experiencing significant swelling and pain in her right knee since early December. The swelling is severe, making the knee appear different from the other. She has undergone multiple fluid aspirations, which provided temporary relief, and received a gel injection that did not alleviate the symptoms. Pain is present when standing, and she feels as though the knee might give way, necessitating the use of a wheelchair for mobility. She limps and avoids putting weight on the right leg, relying on her left leg for support.  Chronic back pain is managed with lidocaine patches at night, which help her sleep. She has a history of back surgery and nerve deadening procedures, which have provided some relief. She also  takes Celebrex twice daily and Tylenol for arthritis pain, and occasionally uses clonazepam or muscle relaxants as needed.  Her blood pressure has been variable, and she currently takes 5 mg of amlodipine in the evening along with a diuretic in the morning.  She manages her cholesterol by taking medication every other day, which she believes has been effective.  Her kidney function has been stable, with a GFR of 63. She has been advised to reduce water intake slightly due to low sodium levels, possibly related to her Cymbalta use (60 mg daily).  She attempted to use estrogen therapy vaginally twice a week for two weeks but discontinued due to persistent nausea.  She uses a CBD gummy occasionally for pain relief, which was suggested by her daughter. Her mood has improved since using a wheelchair, which allows her to participate in activities and move around more freely.      Past Medical History:  Diagnosis Date   Allergy    Anemia    Anginal pain (HCC)    Anxiety    Arthritis    Atypical moles    Breast lump    left   Chronic kidney disease    Colon polyp    Depression    Diverticulitis    04-15-15 no recent issues   Dysrhythmia 01/2020   Edema    legs, hands  bilateral   GERD (gastroesophageal reflux disease)    hx of    Heart murmur 01/2020  High cholesterol    Per new patient form   Hypertension    Hypothyroidism    IBS (irritable bowel syndrome)    Per new patient form   Impaired hearing    bilaterally some hearing loss-no hearing aids   Lactose intolerance    Per new patient form   Macular degeneration    Per new patient form   Mild cognitive impairment with memory loss 08/02/2000   per Dr Loleta Chance, neuro, 04-15-15 no changes-"stable"   Recurrent upper respiratory infection (URI)    cold with sinus drainage x several months- saw PCP 10/08/11- chest x ray report on chart.  no fever,resolved, no recent issues as of 04-15-15.   Shoulder joint pain    04-07-15 "tripped-loss  balance, fell awkwardly and "wrenched shoulder-left", Known "bad right shoulder"bone on bone"   Skin lesion of breast    hx of right lateral breast  lesion-tx. with multipe rounds oral antibiotic- resolved, no reoccurrence.   Sleep apnea    Thyroid disease    hypothyroidism    Past Surgical History:  Procedure Laterality Date   BREAST SURGERY     lumpectomy- left-benign   CATARACT EXTRACTION, BILATERAL Bilateral    CHOLECYSTECTOMY     laparoscopic   COLECTOMY  10/20/2011   sigmoid colectomy   COLONOSCOPY  2018   DG  BONE DENSITY ([facility] HX)  2021   Coralie Common, PA; [facility]/ Per new patient form   ESOPHAGOGASTRODUODENOSCOPY     EYE SURGERY     bilateral cataracts   HERNIA REPAIR     INCISIONAL HERNIA REPAIR N/A 01/12/2013   Procedure: LAPAROSCOPIC/OPEN REPAIR INCISIONAL HERNIA;  Surgeon: Mariella Saa, MD;  Location: [facility];  Service: General;  Laterality: N/A;  With Mesh   INCISIONAL HERNIA REPAIR N/A 04/23/2015   Procedure: LAPAROSCOPIC ASSISTED INCISIONAL VENTRAL HERNIA REPAIR;  Surgeon: Glenna Fellows, MD;  Location: [facility];  Service: General;  Laterality: N/A;   INSERTION OF MESH N/A 04/23/2015   Procedure: INSERTION OF MESH;  Surgeon: Glenna Fellows, MD;  Location: [facility];  Service: General;  Laterality: N/A;   JOINT REPLACEMENT  05/2021   left hip   KYPHOPLASTY N/A 08/19/2021   Procedure: KYPHOPLASTY THORACIC TWELVE, LUMBAR ONE;  Surgeon: Tressie Stalker, MD;  Location: [facility];  Service: Neurosurgery;  Laterality: N/A;  KYPHOPLASTY THORACIC TWELVE, LUMBAR ONE   TOTAL HIP ARTHROPLASTY Left 2022   [facility], [facility]; Per new patient form   TUBAL LIGATION  1972    Allergies  Allergen Reactions   Penicillins Anaphylaxis   Codeine     Makes pt overly excited.... Anxious/ hyperactive    Nsaids     Burns stomach     Outpatient Encounter Medications as of 09/13/2023  Medication Sig   albuterol (VENTOLIN HFA) 108 (90 Base) MCG/ACT inhaler  Inhale 2 puffs into the lungs every 6 (six) hours as needed for wheezing or shortness of breath.   amLODipine (NORVASC) 5 MG tablet TAKE 1 TABLET (5 MG TOTAL) BY MOUTH DAILY.   Calcium Carb-Cholecalciferol (CALCIUM 600 + D PO) Take 1 tablet by mouth daily.   Carboxymethylcellulose Sodium (LUBRICANT EYE DROPS OP) Apply 1 drop to eye as needed.   celecoxib (CELEBREX) 100 MG capsule TAKE 1 CAPSULE BY MOUTH 2 TIMES DAILY AS NEEDED FOR MODERATE PAIN.   clonazePAM (KLONOPIN) 0.5 MG tablet TAKE 1 TABLET BY MOUTH 2 TIMES DAILY AS NEEDED FOR ANXIETY.   cyanocobalamin 100 MCG tablet Take 100 mcg by mouth  daily.   DULoxetine (CYMBALTA) 60 MG capsule TAKE 1 CAPSULE BY MOUTH EVERY DAY   FLUAD 0.5 ML injection Inject 0.5 mLs into the muscle once.   hyoscyamine (LEVSIN SL) 0.125 MG SL tablet Place 1 tablet (0.125 mg total) under the tongue daily as needed for cramping.   levothyroxine (SYNTHROID) 50 MCG tablet Take 1 tablet (50 mcg total) by mouth daily before breakfast.   loratadine (CLARITIN) 10 MG tablet Take 10 mg by mouth daily.   methocarbamol (ROBAXIN) 500 MG tablet Take 0.5 tablets (250 mg total) by mouth 2 (two) times daily as needed for muscle spasms.   Multiple Vitamins-Minerals (PRESERVISION AREDS 2+MULTI VIT PO) Take 1 capsule by mouth daily.   omeprazole (PRILOSEC) 20 MG capsule Take 1 capsule (20 mg total) by mouth daily. K21.9   polyethylene glycol (MIRALAX / GLYCOLAX) 17 g packet Take 17 g by mouth daily as needed.   rosuvastatin (CRESTOR) 5 MG tablet Take 1 tablet (5 mg total) by mouth daily. (Patient taking differently: Take 5 mg by mouth every other day.)   SPIKEVAX syringe Inject 0.5 mLs into the muscle once.   traMADol (ULTRAM) 50 MG tablet Take 1-2 tablets (50-100 mg total) by mouth every 12 (twelve) hours as needed.   triamterene-hydrochlorothiazide (DYAZIDE) 37.5-25 MG capsule TAKE 1 EACH (1 CAPSULE TOTAL) BY MOUTH EVERY MORNING.   [DISCONTINUED] estradiol (ESTRACE) 0.1 MG/GM vaginal  cream Place 1 Applicatorful vaginally 2 (two) times a week.   No facility-administered encounter medications on file as of 09/13/2023.    Review of Systems:  Review of Systems  Constitutional:  Positive for activity change. Negative for appetite change.  HENT: Negative.    Respiratory:  Negative for cough and shortness of breath.   Cardiovascular:  Negative for leg swelling.  Gastrointestinal:  Negative for constipation.  Genitourinary: Negative.   Musculoskeletal:  Positive for arthralgias, back pain and gait problem. Negative for myalgias.  Skin: Negative.   Neurological:  Negative for dizziness and weakness.  Psychiatric/Behavioral:  Negative for confusion, dysphoric mood and sleep disturbance.     Health Maintenance  Topic Date Due   DTaP/Tdap/Td (1 - Tdap) Never done   INFLUENZA VACCINE  03/03/2023   COVID-19 Vaccine (8 - 2024-25 season) 04/03/2023   Medicare Annual Wellness (AWV)  01/31/2024   Pneumonia Vaccine 64+ Years old  Completed   DEXA SCAN  Completed   Zoster Vaccines- Shingrix  Completed   HPV VACCINES  Aged Out    Physical Exam: Vitals:   09/13/23 1443  BP: (!) 160/80  Pulse: 81  Resp: 17  Temp: 98.4 F (36.9 C)  TempSrc: Temporal  SpO2: 98%  Weight: 172 lb 3.2 oz (78.1 kg)  Height: 5\' 2"  (1.575 m)   Body mass index is 31.5 kg/m. Physical Exam Vitals reviewed.  Constitutional:      Appearance: Normal appearance.  HENT:     Head: Normocephalic.     Right Ear: Tympanic membrane normal.     Left Ear: Tympanic membrane normal.     Ears:     Comments: Wax in Left ear    Nose: Nose normal.     Mouth/Throat:     Mouth: Mucous membranes are moist.     Pharynx: Oropharynx is clear.  Eyes:     Pupils: Pupils are equal, round, and reactive to light.  Cardiovascular:     Rate and Rhythm: Normal rate and regular rhythm.     Pulses: Normal pulses.     Heart  sounds: Normal heart sounds. No murmur heard. Pulmonary:     Effort: Pulmonary effort is  normal.     Breath sounds: Normal breath sounds.  Abdominal:     General: Abdomen is flat. Bowel sounds are normal.     Palpations: Abdomen is soft.  Musculoskeletal:        General: No swelling.     Cervical back: Neck supple.     Comments: Right Knee swelling  Skin:    General: Skin is warm.  Neurological:     General: No focal deficit present.     Mental Status: She is alert and oriented to person, place, and time.  Psychiatric:        Mood and Affect: Mood normal.        Thought Content: Thought content normal.     Labs reviewed: Basic Metabolic Panel: Recent Labs    11/23/22 0000 08/18/23 0000  NA 136* 134*  K 4.5 4.2  CL 98* 97*  CO2 25* 27*  BUN 15 17  CREATININE 0.8 0.9  CALCIUM 9.3 8.8  TSH 2.09  --    Liver Function Tests: Recent Labs    11/23/22 0000 08/18/23 0000  AST 20 15  ALT 17 14  ALKPHOS 95 109  ALBUMIN 4.6 3.9   No results for input(s): "LIPASE", "AMYLASE" in the last 8760 hours. No results for input(s): "AMMONIA" in the last 8760 hours. CBC: Recent Labs    05/24/23 0000 08/18/23 0000  WBC 6.3 6.2  HGB 12.5 12.3  HCT 36 35*  PLT 311 295   Lipid Panel: Recent Labs    08/18/23 0000  CHOL 179  HDL 57  LDLCALC 92  TRIG 152   No results found for: "HGBA1C"  Procedures since last visit: No results found.  Assessment/Plan Assessment and Plan    Knee Pain Severe pain and swelling in the right knee, limiting mobility and requiring wheelchair use. Previous interventions including fluid drainage and gel injections have provided temporary relief. MRI results pending. -Continue with pain management using lidocaine patches and oral pain medication. -Follow-up with Dr. Magnus Ivan on 09/29/2023 for further assessment and management plan.  Hypertension Recent blood pressure readings have been elevated despite current medication regimen. -Increase Amlodipine to 2.5mg  in the morning and continue 5mg  in the evening. -Monitor blood pressure  more frequently.  Hyperlipidemia Recent cholesterol levels have slightly increased but remain within acceptable range on current regimen of Crestor every other day. -Continue Crestor every other day. -Recheck lipid panel in 3 months.  Chronic Back Pain Chronic back pain managed with lidocaine patches and occasional use of Robaxin. Previous interventions by pain specialist have provided some relief. -Continue current pain management regimen. -Keep Robaxin prescription active for use as needed.  Estrogen Vaginally Therapy Discontinued due to persistent nausea. -Discontinue estrogen-progesterone therapy.  Ear Wax Buildup Noted in left ear. -Order ear washing for both ears.  Hyponatremia Noted on recent labs, possibly due to excessive water intake and Cymbalta use. -Reduce water intake by one glass per day.  Arthritis Chronic pain managed with Celebrex and Tylenol Arthritis. -Continue current regimen.  Urinary Function No issues with incontinence. Double voiding technique is effective. -Continue current urinary management.  Mental Health Mood has improved with increased mobility from wheelchair use. -Continue current lifestyle modifications.  General Health Maintenance -Continue current medications including Cymbalta 60mg  daily, diuretic in the morning, and CBD gummy as needed for pain. -Continue current lifestyle modifications including participation in theater and water therapy. -Plan  to recheck labs in 3 months. -Follow-up in 3 months.         Labs/tests ordered:  Labs Ordered Next appt:  09/13/2023

## 2023-09-15 DIAGNOSIS — H353132 Nonexudative age-related macular degeneration, bilateral, intermediate dry stage: Secondary | ICD-10-CM | POA: Diagnosis not present

## 2023-09-15 DIAGNOSIS — H43813 Vitreous degeneration, bilateral: Secondary | ICD-10-CM | POA: Diagnosis not present

## 2023-09-23 ENCOUNTER — Other Ambulatory Visit: Payer: Self-pay | Admitting: Adult Health

## 2023-09-23 MED ORDER — MECLIZINE HCL 12.5 MG PO TABS: 12.5000 mg | ORAL_TABLET | Freq: Two times a day (BID) | ORAL | 0 refills | Status: DC | PRN

## 2023-09-25 ENCOUNTER — Other Ambulatory Visit: Payer: Self-pay | Admitting: Adult Health

## 2023-09-25 ENCOUNTER — Other Ambulatory Visit: Payer: Self-pay | Admitting: Internal Medicine

## 2023-09-25 DIAGNOSIS — S32010D Wedge compression fracture of first lumbar vertebra, subsequent encounter for fracture with routine healing: Secondary | ICD-10-CM

## 2023-09-25 DIAGNOSIS — I10 Essential (primary) hypertension: Secondary | ICD-10-CM

## 2023-09-25 DIAGNOSIS — E039 Hypothyroidism, unspecified: Secondary | ICD-10-CM

## 2023-09-26 ENCOUNTER — Encounter: Payer: Self-pay | Admitting: Orthopaedic Surgery

## 2023-09-26 ENCOUNTER — Ambulatory Visit: Payer: Medicare HMO | Admitting: Orthopaedic Surgery

## 2023-09-26 DIAGNOSIS — M1711 Unilateral primary osteoarthritis, right knee: Secondary | ICD-10-CM | POA: Diagnosis not present

## 2023-09-26 NOTE — Progress Notes (Signed)
 The patient is an active 89 year old female who comes in today for continued follow-up as a relates to severe right knee pain and arthritis.  We actually did obtain an MRI recently of her right knee.  The MRI is reviewed today and shows severe arthritis in her knee with areas of full-thickness cartilage loss.  This mainly involves the lateral compartment the knee and there is subchondral edema consistent with an insufficiency fracture.  The hyaluronic acid injection was placed in her knee about 4 weeks ago and it is really not helped her much at all.  She really gets around more so in a wheelchair due to the knee pain.  She will start about options today.  I did review all of her medications and past medical history within epic.  She is not a diabetic.  She is not on blood thinning medication either.  All of her medical issues are stable.  Examination of her right knee today shows no effusion but there is global tenderness.  She has a lot of patellofemoral crepitation.  Again the imaging studies show severe end-stage arthritis of the right knee.  From my standpoint what can help her the most is a knee replacement.  There is a family member having a wedding on April 4 that she wants to wait till after then.  I recommend that we see her back in 4 weeks from now to place a steroid injection in her right knee at that visit in order to help her for the wedding standpoint of things.  We can then work on scheduling her for knee replacement as well.

## 2023-09-26 NOTE — Telephone Encounter (Signed)
 High Risk Warning Populated when attempting to refill, I will send to Provider for further review

## 2023-10-24 ENCOUNTER — Ambulatory Visit: Payer: Medicare HMO | Admitting: Orthopaedic Surgery

## 2023-10-24 DIAGNOSIS — M1711 Unilateral primary osteoarthritis, right knee: Secondary | ICD-10-CM | POA: Diagnosis not present

## 2023-10-24 DIAGNOSIS — G8929 Other chronic pain: Secondary | ICD-10-CM | POA: Diagnosis not present

## 2023-10-24 DIAGNOSIS — M25561 Pain in right knee: Secondary | ICD-10-CM | POA: Diagnosis not present

## 2023-10-24 MED ORDER — METHYLPREDNISOLONE ACETATE 40 MG/ML IJ SUSP
40.0000 mg | INTRAMUSCULAR | Status: AC | PRN
  Administered 2023-10-24: 40 mg via INTRA_ARTICULAR

## 2023-10-24 MED ORDER — LIDOCAINE HCL 1 % IJ SOLN
3.0000 mL | INTRAMUSCULAR | Status: AC | PRN
  Administered 2023-10-24: 3 mL

## 2023-10-24 NOTE — Progress Notes (Signed)
 The patient is an 89 year old female with known painful arthritis of her right knee.  She has had hyaluronic acid injections before and that has not really helped.  Steroid injections have helped temporize her pain some in the past.  She has a wedding to attend a family member coming up in April and is here today for steroid injection in her right knee today which I think is certainly reasonable.  She fully understands the risk and benefits of steroid injections and how this may help temporize her pain.  On exam today her right knee shows no effusion.  I did place a steroid injection in her knee without difficulty.  We then had a long and thorough discussion about the potential for knee replacement surgery in the future.  She knows to wait at least 3 to 4 months between steroid injections.  We talked about the interoperative and postoperative course and at this time she is in think about and let us know.    Procedure Note  Patient: Sophia Nash             Date of Birth: November 12, 1934           MRN: [id number]             Visit Date: 10/24/2023  Procedures: Visit Diagnoses:  1. Unilateral primary osteoarthritis, right knee   2. Chronic pain of right knee     Large Joint Inj: R knee on 10/24/2023 2:04 PM Indications: diagnostic evaluation and pain Details: 22 G 1.5 in needle, superolateral approach  Arthrogram: No  Medications: 3 mL lidocaine 1 %; 40 mg methylPREDNISolone acetate 40 MG/ML Outcome: tolerated well, no immediate complications Procedure, treatment alternatives, risks and benefits explained, specific risks discussed. Consent was given by the patient. Immediately prior to procedure a time out was called to verify the correct patient, procedure, equipment, support staff and site/side marked as required. Patient was prepped and draped in the usual sterile fashion.

## 2023-10-27 ENCOUNTER — Other Ambulatory Visit: Payer: Self-pay | Admitting: Orthopedic Surgery

## 2023-10-27 NOTE — Telephone Encounter (Signed)
 High risk warning unable to refill rx printed out .

## 2023-10-28 ENCOUNTER — Other Ambulatory Visit: Payer: Self-pay | Admitting: Adult Health

## 2023-10-28 ENCOUNTER — Other Ambulatory Visit: Payer: Self-pay | Admitting: Orthopedic Surgery

## 2023-10-28 MED ORDER — CLONAZEPAM 0.5 MG PO TABS: 0.5000 mg | ORAL_TABLET | Freq: Two times a day (BID) | ORAL | 1 refills | Status: DC | PRN

## 2023-11-09 DIAGNOSIS — F332 Major depressive disorder, recurrent severe without psychotic features: Secondary | ICD-10-CM | POA: Diagnosis not present

## 2023-11-21 ENCOUNTER — Non-Acute Institutional Stay: Payer: Self-pay | Admitting: Internal Medicine

## 2023-11-21 ENCOUNTER — Encounter: Payer: Self-pay | Admitting: Internal Medicine

## 2023-11-21 DIAGNOSIS — J209 Acute bronchitis, unspecified: Secondary | ICD-10-CM | POA: Diagnosis not present

## 2023-11-21 MED ORDER — AZITHROMYCIN 250 MG PO TABS: ORAL_TABLET | ORAL | 0 refills | Status: AC

## 2023-11-21 MED ORDER — PREDNISONE 20 MG PO TABS: 20.0000 mg | ORAL_TABLET | Freq: Every day | ORAL | 0 refills | Status: DC

## 2023-11-21 NOTE — Progress Notes (Signed)
 Location: Medical illustrator of Service:  [facility] (13)  Provider:   Code Status:  Goals of Care:     09/13/2023    2:46 PM  Advanced Directives  Does Patient Have a Medical Advance Directive? Yes  Type of Estate agent of [place];Living will  Does patient want to make changes to medical advance directive? No - Patient declined  Copy of Healthcare Power of Attorney in Chart? Yes - validated most recent copy scanned in chart (See row information)     Chief Complaint  Patient presents with   Acute Visit    HPI: Patient is a 89 y.o. female seen today for an acute visit for Cough Congestion and Wheezing Lives in [place] in [place]  Having Cough for past few days With Thick mucus Green discharge Some wheezing noticed No Fever Feels Weak and tired No Chest pain or SOB  Cannot sleep due to COngestion   Past Medical History:  Diagnosis Date   Allergy    Anemia    Anginal pain (HCC)    Anxiety    Arthritis    Atypical moles    Breast lump    left   Chronic kidney disease    Colon polyp    Depression    Diverticulitis    04-15-15 no recent issues   Dysrhythmia 01/2020   Edema    legs, hands  bilateral   GERD (gastroesophageal reflux disease)    hx of    Heart murmur 01/2020   High cholesterol    Per new patient form   Hypertension    Hypothyroidism    IBS (irritable bowel syndrome)    Per new patient form   Impaired hearing    bilaterally some hearing loss-no hearing aids   Lactose intolerance    Per new patient form   Macular degeneration    Per new patient form   Mild cognitive impairment with memory loss 08/02/2000   per Dr Genita Keys, neuro, 04-15-15 no changes-"stable"   Recurrent upper respiratory infection (URI)    cold with sinus drainage x several months- saw PCP 10/08/11- chest x ray report on chart.  no fever,resolved, no recent issues as of 04-15-15.   Shoulder joint pain    04-07-15 "tripped-loss balance, fell awkwardly  and "wrenched shoulder-left", Known "bad right shoulder"bone on bone"   Skin lesion of breast    hx of right lateral breast  lesion-tx. with multipe rounds oral antibiotic- resolved, no reoccurrence.   Sleep apnea    Thyroid  disease    hypothyroidism    Past Surgical History:  Procedure Laterality Date   BREAST SURGERY     lumpectomy- left-benign   CATARACT EXTRACTION, BILATERAL Bilateral    CHOLECYSTECTOMY     laparoscopic   COLECTOMY  10/20/2011   sigmoid colectomy   COLONOSCOPY  2018   DG  BONE DENSITY ([facility] HX)  2021   Retia Castellani, PA; [facility]/ Per new patient form   ESOPHAGOGASTRODUODENOSCOPY     EYE SURGERY     bilateral cataracts   HERNIA REPAIR     INCISIONAL HERNIA REPAIR N/A 01/12/2013   Procedure: LAPAROSCOPIC/OPEN REPAIR INCISIONAL HERNIA;  Surgeon: Quitman Bucy, MD;  Location: [facility];  Service: General;  Laterality: N/A;  With Mesh   INCISIONAL HERNIA REPAIR N/A 04/23/2015   Procedure: LAPAROSCOPIC ASSISTED INCISIONAL VENTRAL HERNIA REPAIR;  Surgeon: Ayesha Lente, MD;  Location: [facility];  Service: General;  Laterality: N/A;   INSERTION OF  MESH N/A 04/23/2015   Procedure: INSERTION OF MESH;  Surgeon: Ayesha Lente, MD;  Location: [facility];  Service: General;  Laterality: N/A;   JOINT REPLACEMENT  05/2021   left hip   KYPHOPLASTY N/A 08/19/2021   Procedure: KYPHOPLASTY THORACIC TWELVE, LUMBAR ONE;  Surgeon: Garry Kansas, MD;  Location: [facility];  Service: Neurosurgery;  Laterality: N/A;  KYPHOPLASTY THORACIC TWELVE, LUMBAR ONE   TOTAL HIP ARTHROPLASTY Left 2022   [facility], [facility]; Per new patient form   TUBAL LIGATION  1972    Allergies  Allergen Reactions   Penicillins Anaphylaxis   Codeine     Makes pt overly excited.... Anxious/ hyperactive    Nsaids     Burns stomach     Outpatient Encounter Medications as of 11/21/2023  Medication Sig   azithromycin  (ZITHROMAX ) 250 MG tablet Take 2 tablets on day 1, then 1 tablet  daily on days 2 through 5   predniSONE  (DELTASONE ) 20 MG tablet Take 1 tablet (20 mg total) by mouth daily with breakfast.   albuterol  (VENTOLIN  HFA) 108 (90 Base) MCG/ACT inhaler Inhale 2 puffs into the lungs every 6 (six) hours as needed for wheezing or shortness of breath.   amLODipine  (NORVASC ) 2.5 MG tablet Take 1 tablet (2.5 mg total) by mouth daily.   amLODipine  (NORVASC ) 5 MG tablet TAKE 1 TABLET (5 MG TOTAL) BY MOUTH DAILY.   Calcium  Carb-Cholecalciferol (CALCIUM  600 + D PO) Take 1 tablet by mouth daily.   Carboxymethylcellulose Sodium (LUBRICANT EYE DROPS OP) Apply 1 drop to eye as needed.   celecoxib  (CELEBREX ) 100 MG capsule TAKE 1 CAPSULE BY MOUTH 2 TIMES DAILY AS NEEDED FOR MODERATE PAIN.   clonazePAM  (KLONOPIN ) 0.5 MG tablet Take 1 tablet (0.5 mg total) by mouth 2 (two) times daily as needed for anxiety.   cyanocobalamin 100 MCG tablet Take 100 mcg by mouth daily.   DULoxetine  (CYMBALTA ) 60 MG capsule TAKE 1 CAPSULE BY MOUTH EVERY DAY   FLUAD 0.5 ML injection Inject 0.5 mLs into the muscle once.   hyoscyamine  (LEVSIN SL) 0.125 MG SL tablet Place 1 tablet (0.125 mg total) under the tongue daily as needed for cramping.   levothyroxine  (SYNTHROID ) 50 MCG tablet TAKE 1 TABLET BY MOUTH DAILY BEFORE BREAKFAST   loratadine  (CLARITIN ) 10 MG tablet Take 10 mg by mouth daily.   meclizine  (ANTIVERT ) 12.5 MG tablet Take 1 tablet (12.5 mg total) by mouth 2 (two) times daily as needed for dizziness.   methocarbamol  (ROBAXIN ) 500 MG tablet Take 0.5 tablets (250 mg total) by mouth 2 (two) times daily as needed for muscle spasms.   Multiple Vitamins-Minerals (PRESERVISION AREDS 2+MULTI VIT PO) Take 1 capsule by mouth daily.   omeprazole  (PRILOSEC) 20 MG capsule Take 1 capsule (20 mg total) by mouth daily. K21.9   polyethylene glycol (MIRALAX / GLYCOLAX) 17 g packet Take 17 g by mouth daily as needed.   rosuvastatin  (CRESTOR ) 5 MG tablet Take 5 mg by mouth every other day.   SPIKEVAX syringe Inject  0.5 mLs into the muscle once.   traMADol  (ULTRAM ) 50 MG tablet Take 1-2 tablets (50-100 mg total) by mouth every 12 (twelve) hours as needed.   triamterene -hydrochlorothiazide  (DYAZIDE ) 37.5-25 MG capsule TAKE 1 CAPSULE BY MOUTH EVERY MORNING   No facility-administered encounter medications on file as of 11/21/2023.    Review of Systems:  Review of Systems  Constitutional:  Negative for activity change and appetite change.  HENT:  Positive for rhinorrhea, sinus  pressure, sinus pain and sore throat.   Respiratory:  Positive for cough. Negative for shortness of breath.   Cardiovascular:  Negative for leg swelling.  Gastrointestinal:  Negative for constipation.  Genitourinary: Negative.   Musculoskeletal:  Positive for gait problem. Negative for arthralgias and myalgias.  Skin: Negative.   Neurological:  Positive for weakness. Negative for dizziness.  Psychiatric/Behavioral:  Negative for confusion, dysphoric mood and sleep disturbance.     Health Maintenance  Topic Date Due   DTaP/Tdap/Td (1 - Tdap) Never done   COVID-19 Vaccine (8 - 2024-25 season) 04/03/2023   Medicare Annual Wellness (AWV)  01/31/2024   INFLUENZA VACCINE  03/02/2024   Pneumonia Vaccine 65+ Years old  Completed   DEXA SCAN  Completed   Zoster Vaccines- Shingrix  Completed   HPV VACCINES  Aged Out   Meningococcal B Vaccine  Aged Out    Physical Exam: Vitals:   11/21/23 2112  BP: 136/82  Pulse: 80  Resp: 17  Temp: (!) 96.8 F (36 C)  SpO2: 97%   There is no height or weight on file to calculate BMI. Physical Exam Vitals reviewed.  Constitutional:      Appearance: Normal appearance.  HENT:     Head: Normocephalic.     Nose: Congestion present.     Mouth/Throat:     Mouth: Mucous membranes are moist.     Pharynx: Oropharynx is clear. No oropharyngeal exudate or posterior oropharyngeal erythema.  Eyes:     Pupils: Pupils are equal, round, and reactive to light.  Cardiovascular:     Rate and Rhythm:  Regular rhythm. Tachycardia present.     Pulses: Normal pulses.     Heart sounds: Normal heart sounds. No murmur heard. Pulmonary:     Effort: Pulmonary effort is normal.     Breath sounds: Rhonchi present. No wheezing or rales.  Abdominal:     General: Abdomen is flat. Bowel sounds are normal.     Palpations: Abdomen is soft.  Musculoskeletal:        General: No swelling.     Cervical back: Neck supple.  Skin:    General: Skin is warm.  Neurological:     General: No focal deficit present.     Mental Status: She is alert and oriented to person, place, and time.  Psychiatric:        Mood and Affect: Mood normal.        Thought Content: Thought content normal.     Labs reviewed: Basic Metabolic Panel: Recent Labs    11/23/22 0000 08/18/23 0000  NA 136* 134*  K 4.5 4.2  CL 98* 97*  CO2 25* 27*  BUN 15 17  CREATININE 0.8 0.9  CALCIUM  9.3 8.8  TSH 2.09  --    Liver Function Tests: Recent Labs    11/23/22 0000 08/18/23 0000  AST 20 15  ALT 17 14  ALKPHOS 95 109  ALBUMIN 4.6 3.9   No results for input(s): "LIPASE", "AMYLASE" in the last 8760 hours. No results for input(s): "AMMONIA" in the last 8760 hours. CBC: Recent Labs    05/24/23 0000 08/18/23 0000  WBC 6.3 6.2  HGB 12.5 12.3  HCT 36 35*  PLT 311 295   Lipid Panel: Recent Labs    08/18/23 0000  CHOL 179  HDL 57  LDLCALC 92  TRIG 152   No results found for: "HGBA1C"  Procedures since last visit: No results found.  Assessment/Plan 1. Acute bronchitis, unspecified organism (  Primary) Prednisone  20 mg every day for 7 days Z Pack Mucinex DM for Cough Can use Proair  Prn also  If cough not better in 2 weeks to let us  know    Labs/tests ordered:  * No order type specified * Next appt:  12/12/2023

## 2023-12-08 DIAGNOSIS — E7800 Pure hypercholesterolemia, unspecified: Secondary | ICD-10-CM | POA: Diagnosis not present

## 2023-12-08 DIAGNOSIS — E039 Hypothyroidism, unspecified: Secondary | ICD-10-CM | POA: Diagnosis not present

## 2023-12-08 LAB — LIPID PANEL
Cholesterol: 196 (ref 0–200)
HDL: 58 (ref 35–70)
LDL Cholesterol: 99
LDl/HDL Ratio: 3.4
Triglycerides: 198 — AB (ref 40–160)

## 2023-12-08 LAB — TSH: TSH: 0.59 (ref 0.41–5.90)

## 2023-12-12 ENCOUNTER — Non-Acute Institutional Stay: Payer: Medicare HMO | Admitting: Adult Health

## 2023-12-12 ENCOUNTER — Encounter: Payer: Self-pay | Admitting: Adult Health

## 2023-12-12 VITALS — BP 104/80 | HR 105 | Temp 98.3°F | Resp 20 | Ht 62.0 in | Wt 168.4 lb

## 2023-12-12 DIAGNOSIS — E7800 Pure hypercholesterolemia, unspecified: Secondary | ICD-10-CM | POA: Diagnosis not present

## 2023-12-12 DIAGNOSIS — E039 Hypothyroidism, unspecified: Secondary | ICD-10-CM

## 2023-12-12 DIAGNOSIS — S32010D Wedge compression fracture of first lumbar vertebra, subsequent encounter for fracture with routine healing: Secondary | ICD-10-CM

## 2023-12-12 DIAGNOSIS — I10 Essential (primary) hypertension: Secondary | ICD-10-CM | POA: Diagnosis not present

## 2023-12-12 DIAGNOSIS — M1711 Unilateral primary osteoarthritis, right knee: Secondary | ICD-10-CM | POA: Diagnosis not present

## 2023-12-12 MED ORDER — CELECOXIB 100 MG PO CAPS: 100.0000 mg | ORAL_CAPSULE | Freq: Two times a day (BID) | ORAL | 3 refills | Status: DC

## 2023-12-12 MED ORDER — AMLODIPINE BESYLATE 2.5 MG PO TABS: 2.5000 mg | ORAL_TABLET | Freq: Every day | ORAL | 3 refills | Status: DC

## 2023-12-12 MED ORDER — FLUTICASONE PROPIONATE 50 MCG/ACT NA SUSP: 2.0000 | Freq: Two times a day (BID) | NASAL | Status: DC

## 2023-12-12 MED ORDER — LEVOTHYROXINE SODIUM 50 MCG PO TABS: 50.0000 ug | ORAL_TABLET | Freq: Every day | ORAL | 3 refills | Status: AC

## 2023-12-12 NOTE — Progress Notes (Signed)
 Location:  [facility]  POS: Clinic  Provider: Raylene Calamity, ANP  Code Status: Full Goals of Care:     09/13/2023    2:46 PM  Advanced Directives  Does Patient Have a Medical Advance Directive? Yes  Type of Estate agent of [place];Living will  Does patient want to make changes to medical advance directive? No - Patient declined  Copy of Healthcare Power of Attorney in Chart? Yes - validated most recent copy scanned in chart (See row information)     Chief Complaint  Patient presents with   Follow-up    HPI: Patient is a 89 y.o. female seen today for medical management of chronic diseases.    Three weeks ago, she was diagnosed with bronchitis and was treated with a zpack for five days and a seven-day course of prednisone . Despite completing the treatment, she continues to experience a persistent cough, which is more pronounced during the day but also causes her to wake up at night. The cough is less severe than initially but still present. She experiences occasional bladder leakage during severe coughing spells and uses a pad for protection. She used albuterol  for three to four days but has not continued it.  She reports sinus drainage that is clear and slightly thick, which she believes contributes to her cough. She takes Claritin  every morning.  Her blood pressure is well-controlled with amlodipine , taking 2.5 mg at noon and 5 mg in the evening. She has been taking rosuvastatin  every other day for the past four to six months. Recent labs show a slight increase in total cholesterol and triglycerides.  She has a history of hypothyroidism, managed with levothyroxine , and her TSH levels are well-controlled. She has been on this medication for over 54 years.  She experiences significant right knee pain due to arthritis and uses a walker in her room and an electric wheelchair for longer distances. She takes Celebrex  twice daily, Tramadol , and Tylenol  Arthritis  at night for pain management. Orthopedic surgeon indicates she needs a knee replacement. She is pondering   She has a history of stage 3 kidney function, which she manages by increasing her hydration.  Hx of a heart murmur , has seen cardiology. Not able to view echo. Takes antibiotic prior to dental procedure. Has been evaluated by cardiology in the past due to atypical chest pain. Started on statin due to plaque  HR 105. Reports occasional elevated rate in AL. No palpitations or chest pain.    Past Medical History:  Diagnosis Date   Allergy    Anemia    Anginal pain (HCC)    Anxiety    Arthritis    Atypical moles    Breast lump    left   Chronic kidney disease    Colon polyp    Depression    Diverticulitis    04-15-15 no recent issues   Dysrhythmia 01/2020   Edema    legs, hands  bilateral   GERD (gastroesophageal reflux disease)    hx of    Heart murmur 01/2020   High cholesterol    Per new patient form   Hypertension    Hypothyroidism    IBS (irritable bowel syndrome)    Per new patient form   Impaired hearing    bilaterally some hearing loss-no hearing aids   Lactose intolerance    Per new patient form   Macular degeneration    Per new patient form   Mild cognitive impairment with memory loss  08/02/2000   per Dr Genita Keys, neuro, 04-15-15 no changes-"stable"   Recurrent upper respiratory infection (URI)    cold with sinus drainage x several months- saw PCP 10/08/11- chest x ray report on chart.  no fever,resolved, no recent issues as of 04-15-15.   Shoulder joint pain    04-07-15 "tripped-loss balance, fell awkwardly and "wrenched shoulder-left", Known "bad right shoulder"bone on bone"   Skin lesion of breast    hx of right lateral breast  lesion-tx. with multipe rounds oral antibiotic- resolved, no reoccurrence.   Sleep apnea    Thyroid  disease    hypothyroidism    Past Surgical History:  Procedure Laterality Date   BREAST SURGERY     lumpectomy- left-benign    CATARACT EXTRACTION, BILATERAL Bilateral    CHOLECYSTECTOMY     laparoscopic   COLECTOMY  10/20/2011   sigmoid colectomy   COLONOSCOPY  2018   DG  BONE DENSITY ([facility] HX)  2021   Retia Castellani, PA; [facility]/ Per new patient form   ESOPHAGOGASTRODUODENOSCOPY     EYE SURGERY     bilateral cataracts   HERNIA REPAIR     INCISIONAL HERNIA REPAIR N/A 01/12/2013   Procedure: LAPAROSCOPIC/OPEN REPAIR INCISIONAL HERNIA;  Surgeon: Quitman Bucy, MD;  Location: [facility];  Service: General;  Laterality: N/A;  With Mesh   INCISIONAL HERNIA REPAIR N/A 04/23/2015   Procedure: LAPAROSCOPIC ASSISTED INCISIONAL VENTRAL HERNIA REPAIR;  Surgeon: Ayesha Lente, MD;  Location: [facility];  Service: General;  Laterality: N/A;   INSERTION OF MESH N/A 04/23/2015   Procedure: INSERTION OF MESH;  Surgeon: Ayesha Lente, MD;  Location: [facility];  Service: General;  Laterality: N/A;   JOINT REPLACEMENT  05/2021   left hip   KYPHOPLASTY N/A 08/19/2021   Procedure: KYPHOPLASTY THORACIC TWELVE, LUMBAR ONE;  Surgeon: Garry Kansas, MD;  Location: [facility] OR;  Service: Neurosurgery;  Laterality: N/A;  KYPHOPLASTY THORACIC TWELVE, LUMBAR ONE   TOTAL HIP ARTHROPLASTY Left 2022   [facility]; Per new patient form   TUBAL LIGATION  1972    Allergies  Allergen Reactions   Penicillins Anaphylaxis   Codeine     Makes pt overly excited.... Anxious/ hyperactive    Nsaids     Burns stomach     Outpatient Encounter Medications as of 12/12/2023  Medication Sig   acetaminophen  (TYLENOL ) 650 MG CR tablet Take 650 mg by mouth 2 (two) times daily as needed for pain. 1,300mg  BID, oral, Twice a day - PRN, BiD PRN mild pain, may self administer   albuterol  (VENTOLIN  HFA) 108 (90 Base) MCG/ACT inhaler Inhale 2 puffs into the lungs every 6 (six) hours as needed for wheezing or shortness of breath.   amLODipine  (NORVASC ) 2.5 MG tablet Take 1 tablet (2.5 mg total) by mouth daily.   amLODipine  (NORVASC )  5 MG tablet TAKE 1 TABLET (5 MG TOTAL) BY MOUTH DAILY.   Calcium  Carb-Cholecalciferol (CALCIUM  600 + D PO) Take 1 tablet by mouth daily.   Carboxymethylcellulose Sodium (LUBRICANT EYE DROPS OP) Apply 1 drop to eye as needed.   celecoxib  (CELEBREX ) 100 MG capsule TAKE 1 CAPSULE BY MOUTH 2 TIMES DAILY AS NEEDED FOR MODERATE PAIN.   Chlorpheniramine-DM (CORICIDIN COUGH/COLD) 4-30 MG TABS Take by mouth. 1, oral, twice a day - PRN, Prn per instructions on box, may self administer   clindamycin  (CLEOCIN ) 300 MG capsule Take 300 mg by mouth as needed. 300mg , oral, PRN Before any dental appointment, may self administer  clonazePAM  (KLONOPIN ) 0.5 MG tablet Take 1 tablet (0.5 mg total) by mouth 2 (two) times daily as needed for anxiety.   cyanocobalamin 100 MCG tablet Take 100 mcg by mouth daily. 3000 mcg, oral   DULoxetine  (CYMBALTA ) 60 MG capsule TAKE 1 CAPSULE BY MOUTH EVERY DAY   hyoscyamine  (LEVSIN SL) 0.125 MG SL tablet Place 1 tablet (0.125 mg total) under the tongue daily as needed for cramping.   levothyroxine  (SYNTHROID ) 50 MCG tablet TAKE 1 TABLET BY MOUTH DAILY BEFORE BREAKFAST   lidocaine  4 % Place 1 patch onto the skin daily. 1 patch, patch, once a morning, once a morning, patch on 12 hours and off hours   loratadine  (CLARITIN ) 10 MG tablet Take 10 mg by mouth daily.   Menthol, Topical Analgesic, (BIOFREEZE PROFESSIONAL) 5 % GEL Apply topically. Small amount, topical may self administer roll on or ask for assister roll on or ask for assistance from staff. Roll on to right hip or back   methocarbamol  (ROBAXIN ) 500 MG tablet Take 0.5 tablets (250 mg total) by mouth 2 (two) times daily as needed for muscle spasms.   Multiple Vitamins-Minerals (PRESERVISION AREDS 2+MULTI VIT PO) Take 1 capsule by mouth daily.   omeprazole  (PRILOSEC) 20 MG capsule Take 1 capsule (20 mg total) by mouth daily. K21.9   polyethylene glycol (MIRALAX / GLYCOLAX) 17 g packet Take 17 g by mouth daily as needed.    Pseudoephedrine-Guaifenesin (MUCINEX D MAX STRENGTH) [phone] MG TB12 Take by mouth as needed. 1 tablet, oral, as needed   rosuvastatin  (CRESTOR ) 5 MG tablet Take 5 mg by mouth every other day.   traMADol  (ULTRAM ) 50 MG tablet Take 1-2 tablets (50-100 mg total) by mouth every 12 (twelve) hours as needed.   triamterene -hydrochlorothiazide  (DYAZIDE ) 37.5-25 MG capsule TAKE 1 CAPSULE BY MOUTH EVERY MORNING   FLUAD 0.5 ML injection Inject 0.5 mLs into the muscle once. (Patient not taking: Reported on 12/07/2023)   meclizine  (ANTIVERT ) 12.5 MG tablet Take 1 tablet (12.5 mg total) by mouth 2 (two) times daily as needed for dizziness. (Patient not taking: Reported on 12/07/2023)   predniSONE  (DELTASONE ) 20 MG tablet Take 1 tablet (20 mg total) by mouth daily with breakfast. (Patient not taking: Reported on 12/07/2023)   SPIKEVAX syringe Inject 0.5 mLs into the muscle once. (Patient not taking: Reported on 12/07/2023)   No facility-administered encounter medications on file as of 12/12/2023.    Review of Systems:  Review of Systems  Constitutional:  Positive for activity change (more difficulty ambulating). Negative for appetite change, chills, diaphoresis, fatigue, fever and unexpected weight change.  HENT:  Positive for congestion.   Respiratory:  Positive for cough (improving). Negative for shortness of breath and wheezing.   Cardiovascular:  Negative for chest pain, palpitations and leg swelling.  Gastrointestinal:  Negative for abdominal distention, abdominal pain, constipation and diarrhea.  Genitourinary:  Negative for difficulty urinating and dysuria.  Musculoskeletal:  Positive for arthralgias, back pain and gait problem. Negative for joint swelling and myalgias.  Neurological:  Negative for dizziness, tremors, seizures, syncope, facial asymmetry, speech difficulty, weakness, light-headedness, numbness and headaches.  Psychiatric/Behavioral:  Negative for agitation, behavioral problems and confusion.      Health Maintenance  Topic Date Due   DTaP/Tdap/Td (1 - Tdap) 08/17/2022   Medicare Annual Wellness (AWV)  01/31/2024   COVID-19 Vaccine (8 - 2024-25 season) 06/01/2024 (Originally 04/03/2023)   INFLUENZA VACCINE  03/02/2024   Pneumonia Vaccine 69+ Years old  Completed   DEXA SCAN  Completed   Zoster Vaccines- Shingrix  Completed   HPV VACCINES  Aged Out   Meningococcal B Vaccine  Aged Out    Physical Exam: Vitals:   12/12/23 0845  BP: 104/80  Pulse: (!) 105  Resp: 20  Temp: 98.3 F (36.8 C)  SpO2: 95%  Weight: 168 lb 6.4 oz (76.4 kg)  Height: 5\' 2"  (1.575 m)   Body mass index is 30.8 kg/m. Wt Readings from Last 3 Encounters:  12/12/23 168 lb 6.4 oz (76.4 kg)  09/13/23 172 lb 3.2 oz (78.1 kg)  05/31/23 173 lb 6.4 oz (78.7 kg)    Physical Exam Vitals and nursing note reviewed.  Constitutional:      General: She is not in acute distress.    Appearance: Normal appearance. She is not diaphoretic.  HENT:     Head: Normocephalic and atraumatic.     Nose: No congestion.     Mouth/Throat:     Mouth: Mucous membranes are moist.     Pharynx: Oropharynx is clear. No oropharyngeal exudate or posterior oropharyngeal erythema.  Eyes:     Conjunctiva/sclera: Conjunctivae normal.     Pupils: Pupils are equal, round, and reactive to light.  Neck:     Thyroid : No thyromegaly.     Vascular: No carotid bruit or JVD.  Cardiovascular:     Rate and Rhythm: Normal rate and regular rhythm.     Heart sounds: Murmur heard.  Pulmonary:     Effort: Pulmonary effort is normal. No respiratory distress.     Breath sounds: Normal breath sounds. No stridor.  Abdominal:     General: Bowel sounds are normal. There is no distension.     Palpations: Abdomen is soft.     Tenderness: There is no abdominal tenderness.  Musculoskeletal:     Cervical back: No rigidity. No muscular tenderness.     Right lower leg: No edema.     Left lower leg: No edema.     Comments: Right knee with mild  swelling and crepitus.  Lymphadenopathy:     Cervical: Cervical adenopathy present.  Skin:    General: Skin is warm and dry.  Neurological:     General: No focal deficit present.     Mental Status: She is alert and oriented to person, place, and time. Mental status is at baseline.     Cranial Nerves: No cranial nerve deficit.  Psychiatric:        Mood and Affect: Mood normal.     Labs reviewed: Basic Metabolic Panel: Recent Labs    08/18/23 0000  NA 134*  K 4.2  CL 97*  CO2 27*  BUN 17  CREATININE 0.9  CALCIUM  8.8   Liver Function Tests: Recent Labs    08/18/23 0000  AST 15  ALT 14  ALKPHOS 109  ALBUMIN 3.9   No results for input(s): "LIPASE", "AMYLASE" in the last 8760 hours. No results for input(s): "AMMONIA" in the last 8760 hours. CBC: Recent Labs    05/24/23 0000 08/18/23 0000  WBC 6.3 6.2  HGB 12.5 12.3  HCT 36 35*  PLT 311 295   Lipid Panel: Recent Labs    08/18/23 0000  CHOL 179  HDL 57  LDLCALC 92  TRIG 152   No results found for: "HGBA1C"  Procedures since last visit: No results found.  Assessment/Plan  Osteoarthritis of knee on the right Severe osteoarthritis with significant pain and mobility issues.  Discussed spinal anesthesia to reduce cognitive risks. Consideration  of quality of life and potential need for skilled care if mobility declines. - She is Considering total knee replacement. - Order 12-lead EKG to evaluate heart rhythm before potential surgery.  Tachycardia Elevated heart rate with possible irregular heartbeat. Plan to evaluate with EKG and lab tests. - Order 12-lead EKG. - Order CBC and kidney function tests to check for anemia and assess kidney function.  Chronic kidney disease, stage 3 Stage 3 CKD with creatinine at 0.9 in January. Regular Celebrex  use may affect kidney function. Plan to monitor kidney function due to Celebrex  use and current tachycardia.  Nasal congestion -post URI - Use Flonase  nasal spray,  2 sprays BID for 2 weeks. - Consider using albuterol  if cough is due to lung tightness.  Hypertension Blood pressure well controlled with current amlodipine  regimen. - Continue current amlodipine  regimen.  Hyperlipidemia Triglyceride levels increased since reducing rosuvastatin . Decision made to return to daily rosuvastatin  due to increased triglycerides and LDL. - Resume daily rosuvastatin .  Hypothyroidism Well controlled with levothyroxine . TSH within normal range. - Continue current levothyroxine  regimen.   Labs/tests ordered:  * No order type specified * CBC BMP EKG   Total time :  time greater than 50% of total time spent doing pt counseling and coordination of care

## 2023-12-13 DIAGNOSIS — R000 Tachycardia, unspecified: Secondary | ICD-10-CM | POA: Diagnosis not present

## 2023-12-18 ENCOUNTER — Other Ambulatory Visit: Payer: Self-pay | Admitting: Adult Health

## 2023-12-18 DIAGNOSIS — E7800 Pure hypercholesterolemia, unspecified: Secondary | ICD-10-CM

## 2023-12-20 ENCOUNTER — Other Ambulatory Visit: Payer: Self-pay | Admitting: Orthopedic Surgery

## 2023-12-20 DIAGNOSIS — M6289 Other specified disorders of muscle: Secondary | ICD-10-CM

## 2023-12-20 MED ORDER — TIZANIDINE HCL 4 MG PO TABS: 2.0000 mg | ORAL_TABLET | Freq: Two times a day (BID) | ORAL | 0 refills | Status: DC | PRN

## 2024-01-04 DIAGNOSIS — H353132 Nonexudative age-related macular degeneration, bilateral, intermediate dry stage: Secondary | ICD-10-CM | POA: Diagnosis not present

## 2024-01-04 DIAGNOSIS — H43813 Vitreous degeneration, bilateral: Secondary | ICD-10-CM | POA: Diagnosis not present

## 2024-01-09 ENCOUNTER — Telehealth: Payer: Self-pay | Admitting: Orthopaedic Surgery

## 2024-01-09 NOTE — Telephone Encounter (Signed)
 Patient called. She would like gel injections in her R Knee.

## 2024-01-11 ENCOUNTER — Other Ambulatory Visit: Payer: Self-pay | Admitting: Orthopedic Surgery

## 2024-01-11 DIAGNOSIS — M6289 Other specified disorders of muscle: Secondary | ICD-10-CM

## 2024-01-12 NOTE — Telephone Encounter (Signed)
 VOB submitted for Durolane, right knee Next available gel injection would need to be after 02/26/2024

## 2024-01-18 ENCOUNTER — Telehealth: Payer: Self-pay | Admitting: Orthopaedic Surgery

## 2024-01-18 NOTE — Telephone Encounter (Signed)
 Patient called and said ready to schedule but need to see if she got approved first. [phone]

## 2024-01-18 NOTE — Telephone Encounter (Signed)
 Talked with patient concerning gel injection and appt.has been scheduled for 02/27/2024.

## 2024-01-19 ENCOUNTER — Other Ambulatory Visit: Payer: Self-pay

## 2024-01-19 DIAGNOSIS — M1711 Unilateral primary osteoarthritis, right knee: Secondary | ICD-10-CM

## 2024-01-30 ENCOUNTER — Other Ambulatory Visit: Payer: Self-pay | Admitting: Internal Medicine

## 2024-01-30 DIAGNOSIS — I10 Essential (primary) hypertension: Secondary | ICD-10-CM

## 2024-01-30 NOTE — Telephone Encounter (Signed)
 High risk warning populated when attempting to refill medication  Please review and approval if appropriate   Thanks,  [facility] W/CMA

## 2024-02-27 ENCOUNTER — Ambulatory Visit: Admitting: Orthopaedic Surgery

## 2024-02-27 ENCOUNTER — Encounter: Payer: Self-pay | Admitting: Orthopaedic Surgery

## 2024-02-27 DIAGNOSIS — M1711 Unilateral primary osteoarthritis, right knee: Secondary | ICD-10-CM

## 2024-02-27 DIAGNOSIS — G8929 Other chronic pain: Secondary | ICD-10-CM

## 2024-02-27 DIAGNOSIS — M25561 Pain in right knee: Secondary | ICD-10-CM

## 2024-02-27 MED ORDER — SODIUM HYALURONATE 60 MG/3ML IX PRSY
60.0000 mg | PREFILLED_SYRINGE | INTRA_ARTICULAR | Status: AC | PRN
  Administered 2024-02-27: 60 mg via INTRA_ARTICULAR

## 2024-02-27 NOTE — Progress Notes (Signed)
   Procedure Note  Patient: Sophia Nash             Date of Birth: 12-21-1934           MRN: [id number]             Visit Date: 02/27/2024  Procedures: Visit Diagnoses:  1. Chronic pain of right knee   2. Unilateral primary osteoarthritis, right knee     Large Joint Inj: R knee on 02/27/2024 1:24 PM Indications: diagnostic evaluation and pain Details: 22 G 1.5 in needle, superolateral approach  Arthrogram: No  Medications: 60 mg Sodium Hyaluronate 60 MG/3ML Outcome: tolerated well, no immediate complications Procedure, treatment alternatives, risks and benefits explained, specific risks discussed. Consent was given by the patient. Immediately prior to procedure a time out was called to verify the correct patient, procedure, equipment, support staff and site/side marked as required. Patient was prepped and draped in the usual sterile fashion.    The patient comes in today for scheduled hyaluronic acid injection in her right knee to treat the pain from known osteoarthritis of her right knee.  She does stay in assisted living.  Her daughter is with her today.  On exam she has a moderate effusion of her right knee and I was able to aspirate almost 60 cc of fluid from her right knee and then placed in Durolane in her right knee which she tolerated well.  We can always aspirate the knee again in about 3 months and place a steroid injection then and keep alternating these injections because that is what helping her the most.  She is not a surgical candidate either.  All question concerns were answered and addressed.  Lot #[id number]

## 2024-03-09 ENCOUNTER — Other Ambulatory Visit: Payer: Self-pay | Admitting: Internal Medicine

## 2024-03-09 DIAGNOSIS — K219 Gastro-esophageal reflux disease without esophagitis: Secondary | ICD-10-CM

## 2024-03-09 DIAGNOSIS — F339 Major depressive disorder, recurrent, unspecified: Secondary | ICD-10-CM

## 2024-03-12 NOTE — Telephone Encounter (Signed)
 High Risk Warning Populated when attempting to refill, I will send to Provider for further review   Message sent to Charlanne Fredia CROME, MD

## 2024-03-27 ENCOUNTER — Non-Acute Institutional Stay: Payer: Self-pay | Admitting: Orthopedic Surgery

## 2024-03-27 ENCOUNTER — Encounter: Payer: Self-pay | Admitting: Orthopedic Surgery

## 2024-03-27 DIAGNOSIS — H9202 Otalgia, left ear: Secondary | ICD-10-CM | POA: Diagnosis not present

## 2024-03-27 MED ORDER — CETIRIZINE HCL 10 MG PO TABS: 10.0000 mg | ORAL_TABLET | Freq: Every day | ORAL | Status: DC

## 2024-03-27 NOTE — Progress Notes (Signed)
 Careteam: Patient Care Team: Charlanne Fredia CROME, MD as PCP - General (Internal Medicine) Charlanne Fredia CROME, MD as Consulting Physician (Internal Medicine) Bertrand Gowda, MD as Referring Physician (Obstetrics and Gynecology) Kate Lonni CROME, MD as Consulting Physician (Cardiology)  Seen by: Greig Cluster, AGNP-C  PLACE OF SERVICE:  [facility]  Advanced Directive information    Allergies  Allergen Reactions   Penicillins Anaphylaxis   Codeine     Makes pt overly excited.... Anxious/ hyperactive    Nsaids     Burns stomach     Chief Complaint  Patient presents with   Ear Pain    Left ear pain     HPI: Patient is a 89 y.o. female seen today for acute visit due to left ear pain.   She currently resides on the assisted living unit at [facility]. PMH: HTN, HLD, GERD, IBS, hypothyroidism, compression fracture s/p kyphoplasty, carpal tunnel, depression,and anxiety.   She reports left ear pain last night. She also had mild headache and nasal congestion with symptoms. She placed a cotton ball into left ear and pain subsided. She takes generic Claritin  every morning. Does not use Q tips. Denies ear drainage. Afebrile. Vitals stable.   Review of Systems:  Review of Systems  Constitutional:  Negative for chills and fever.  HENT:  Positive for congestion and ear pain. Negative for sinus pain and sore throat.   Respiratory:  Negative for cough.   Cardiovascular:  Negative for chest pain.  Gastrointestinal:  Negative for nausea and vomiting.  Musculoskeletal:  Negative for myalgias.  Neurological:  Positive for headaches.  Psychiatric/Behavioral:  Negative for depression. The patient is not nervous/anxious.     Past Medical History:  Diagnosis Date   Allergy    Anemia    Anginal pain (HCC)    Anxiety    Arthritis    Atypical moles    Breast lump    left   Chronic kidney disease    Colon polyp    Depression    Diverticulitis    04-15-15 no recent issues    Dysrhythmia 01/2020   Edema    legs, hands  bilateral   GERD (gastroesophageal reflux disease)    hx of    Heart murmur 01/2020   High cholesterol    Per new patient form   Hypertension    Hypothyroidism    IBS (irritable bowel syndrome)    Per new patient form   Impaired hearing    bilaterally some hearing loss-no hearing aids   Lactose intolerance    Per new patient form   Macular degeneration    Per new patient form   Mild cognitive impairment with memory loss 08/02/2000   per Dr Leigh, neuro, 04-15-15 no changes-stable   Recurrent upper respiratory infection (URI)    cold with sinus drainage x several months- saw PCP 10/08/11- chest x ray report on chart.  no fever,resolved, no recent issues as of 04-15-15.   Shoulder joint pain    04-07-15 tripped-loss balance, fell awkwardly and wrenched shoulder-left, Known bad right shoulderbone on bone   Skin lesion of breast    hx of right lateral breast  lesion-tx. with multipe rounds oral antibiotic- resolved, no reoccurrence.   Sleep apnea    Thyroid  disease    hypothyroidism   Past Surgical History:  Procedure Laterality Date   BREAST SURGERY     lumpectomy- left-benign   CATARACT EXTRACTION, BILATERAL Bilateral    CHOLECYSTECTOMY  laparoscopic   COLECTOMY  10/20/2011   sigmoid colectomy   COLONOSCOPY  2018   DG  BONE DENSITY [facility] HX)  2021   Damien Gander, PA; [facility]/ Per new patient form   ESOPHAGOGASTRODUODENOSCOPY     EYE SURGERY     bilateral cataracts   HERNIA REPAIR     INCISIONAL HERNIA REPAIR N/A 01/12/2013   Procedure: LAPAROSCOPIC/OPEN REPAIR INCISIONAL HERNIA;  Surgeon: Morene ONEIDA Olives, MD;  Location: [facility];  Service: General;  Laterality: N/A;  With Mesh   INCISIONAL HERNIA REPAIR N/A 04/23/2015   Procedure: LAPAROSCOPIC ASSISTED INCISIONAL VENTRAL HERNIA REPAIR;  Surgeon: Morene Olives, MD;  Location: [facility];  Service: General;  Laterality: N/A;   INSERTION OF MESH N/A 04/23/2015    Procedure: INSERTION OF MESH;  Surgeon: Morene Olives, MD;  Location: [facility];  Service: General;  Laterality: N/A;   JOINT REPLACEMENT  05/2021   left hip   KYPHOPLASTY N/A 08/19/2021   Procedure: KYPHOPLASTY THORACIC TWELVE, LUMBAR ONE;  Surgeon: Mavis Purchase, MD;  Location: [facility] OR;  Service: Neurosurgery;  Laterality: N/A;  KYPHOPLASTY THORACIC TWELVE, LUMBAR ONE   TOTAL HIP ARTHROPLASTY Left 2022   [facility]; Per new patient form   TUBAL LIGATION  1972   Social History:   reports that she has never smoked. She has never used smokeless tobacco. She reports that she does not drink alcohol and does not use drugs.  Family History  Problem Relation Age of Onset   Cancer Mother        breast   Heart disease Father    Hypertension Sister    Dementia Sister    Cancer Brother        colon   Heart Problems Brother    High Cholesterol Daughter     Medications: Patient's Medications  New Prescriptions   No medications on file  Previous Medications   ACETAMINOPHEN  (TYLENOL ) 650 MG CR TABLET    Take 650 mg by mouth 2 (two) times daily as needed for pain. 1,300mg  BID, oral, Twice a day - PRN, BiD PRN mild pain, may self administer   ALBUTEROL  (VENTOLIN  HFA) 108 (90 BASE) MCG/ACT INHALER    Inhale 2 puffs into the lungs every 6 (six) hours as needed for wheezing or shortness of breath.   AMLODIPINE  (NORVASC ) 2.5 MG TABLET    Take 1 tablet (2.5 mg total) by mouth daily.   AMLODIPINE  (NORVASC ) 5 MG TABLET    TAKE 1 TABLET (5 MG TOTAL) BY MOUTH DAILY.   AMLODIPINE  BESYLATE PO    Take 2.5 mg by mouth 2 (two) times daily.   CALCIUM  CARB-CHOLECALCIFEROL (CALCIUM  600 + D PO)    Take 1 tablet by mouth daily.   CARBOXYMETHYLCELLULOSE SODIUM (LUBRICANT EYE DROPS OP)    Apply 1 drop to eye as needed.   CELECOXIB  (CELEBREX ) 100 MG CAPSULE    Take 1 capsule (100 mg total) by mouth in the morning and at bedtime.   CHLORPHENIRAMINE-DM (CORICIDIN COUGH/COLD) 4-30 MG TABS     Take 1 tablet by mouth every 12 (twelve) hours as needed.   CLINDAMYCIN  (CLEOCIN ) 300 MG CAPSULE    Take 300 mg by mouth as needed. 300mg , oral, PRN Before any dental appointment, may self administer   CLONAZEPAM  (KLONOPIN ) 0.5 MG TABLET    Take 1 tablet (0.5 mg total) by mouth 2 (two) times daily as needed for anxiety.   CYANOCOBALAMIN 100 MCG TABLET    Take 100 mcg  by mouth daily. 3000 mcg, oral   DULOXETINE  (CYMBALTA ) 60 MG CAPSULE    TAKE 1 CAPSULE BY MOUTH EVERY DAY   FLUTICASONE  (FLONASE ) 50 MCG/ACT NASAL SPRAY    Place 2 sprays into both nostrils 2 (two) times daily for 14 days.   HYOSCYAMINE  (LEVSIN  SL) 0.125 MG SL TABLET    Place 1 tablet (0.125 mg total) under the tongue daily as needed for cramping.   LEVOTHYROXINE  (SYNTHROID ) 50 MCG TABLET    Take 1 tablet (50 mcg total) by mouth daily before breakfast.   LIDOCAINE  4 %    Place 1 patch onto the skin every 12 (twelve) hours as needed.   LORATADINE  (CLARITIN ) 10 MG TABLET    Take 10 mg by mouth daily.   MENTHOL, TOPICAL ANALGESIC, (BIOFREEZE PROFESSIONAL) 5 % GEL    Apply topically. Small amount, topical may self administer roll on or ask for assister roll on or ask for assistance from staff. Roll on to right hip or back   MULTIPLE VITAMINS-MINERALS (PRESERVISION AREDS 2+MULTI VIT PO)    Take 1 capsule by mouth daily.   OMEPRAZOLE  (PRILOSEC) 20 MG CAPSULE    TAKE 1 CAPSULE BY MOUTH EVERY DAY   POLYETHYLENE GLYCOL (MIRALAX / GLYCOLAX) 17 G PACKET    Take 17 g by mouth daily as needed.   PROPYLENE GLYCOL OP    Apply 1 drop to eye daily as needed.   PSEUDOEPHEDRINE-GUAIFENESIN (MUCINEX D MAX STRENGTH) [phone] MG TB12    Take by mouth as needed. 1 tablet, oral, as needed   ROSUVASTATIN  (CRESTOR ) 5 MG TABLET    Take 1 tablet (5 mg total) by mouth daily. E78.00   TIZANIDINE  (ZANAFLEX ) 4 MG TABLET    TAKE 0.5 TABLETS (2 MG TOTAL) BY MOUTH 2 (TWO) TIMES DAILY AS NEEDED FOR MUSCLE SPASMS.   TRAMADOL  (ULTRAM ) 50 MG TABLET    Take 1-2 tablets  (50-100 mg total) by mouth every 12 (twelve) hours as needed.   TRIAMTERENE -HYDROCHLOROTHIAZIDE  (DYAZIDE ) 37.5-25 MG CAPSULE    TAKE 1 CAPSULE BY MOUTH EVERY MORNING  Modified Medications   No medications on file  Discontinued Medications   No medications on file    Physical Exam:  Vitals:   03/27/24 1454  BP: 135/75  Pulse: 90  Resp: 17  Temp: 98.8 F (37.1 C)  SpO2: 97%  Weight: 168 lb (76.2 kg)  Height: 5' 2 (1.575 m)   Body mass index is 30.73 kg/m. Wt Readings from Last 3 Encounters:  03/27/24 168 lb (76.2 kg)  12/12/23 168 lb 6.4 oz (76.4 kg)  09/13/23 172 lb 3.2 oz (78.1 kg)      Labs reviewed: Basic Metabolic Panel: Recent Labs    08/18/23 0000 12/08/23 0000  NA 134*  --   K 4.2  --   CL 97*  --   CO2 27*  --   BUN 17  --   CREATININE 0.9  --   CALCIUM  8.8  --   TSH  --  0.59   Liver Function Tests: Recent Labs    08/18/23 0000  AST 15  ALT 14  ALKPHOS 109  ALBUMIN 3.9   No results for input(s): LIPASE, AMYLASE in the last 8760 hours. No results for input(s): AMMONIA in the last 8760 hours. CBC: Recent Labs    05/24/23 0000 08/18/23 0000  WBC 6.3 6.2  HGB 12.5 12.3  HCT 36 35*  PLT 311 295   Lipid Panel: Recent Labs  08/18/23 0000 12/08/23 0000  CHOL 179 196  HDL 57 58  LDLCALC 92 99  TRIG 152 198*   TSH: Recent Labs    12/08/23 0000  TSH 0.59   A1C: No results found for: HGBA1C   Assessment/Plan 1. Acute ear pain, left (Primary) - onset last night - exam unremarkable - takes Claritin  for allergies - recommend Zyrtec  10 mg po at bedtime x 2 weeks, then resume Claritin  - advised to contact PCP if no improvement   Next appt: Visit date not found  Nimrit Kehres Gil BODILY  [facility] [phone]

## 2024-03-27 NOTE — Progress Notes (Signed)
 [facility] Room Number: 533-P Place of Service:  ALF [phone]) Provider:  Greig Cluster, NP  PCP: Charlanne Fredia CROME, MD  Patient Care Team: Charlanne Fredia CROME, MD as PCP - General (Internal Medicine) Charlanne Fredia CROME, MD as Consulting Physician (Internal Medicine) Bertrand Gowda, MD as Referring Physician (Obstetrics and Gynecology) Kate Lonni CROME, MD as Consulting Physician (Cardiology)  Extended Emergency Contact Information Primary Emergency Contact: East,Teresa Address: [address]          [place], [place] [address] [place] Home Phone: [phone] Mobile Phone: [phone] Relation: Daughter Secondary Emergency Contact: Donzetta Brinda Slough Address: [address]          [place], [place] [address] [place] Home Phone: [phone] Mobile Phone: [phone] Relation: Daughter  Code Status:  FULL CODE Goals of care: Advanced Directive information    03/27/2024    3:07 PM  Advanced Directives  Does Patient Have a Medical Advance Directive? Yes  Type of Advance Directive Healthcare Power of Attorney  Copy of Healthcare Power of Attorney in Chart? Yes - validated most recent copy scanned in chart (See row information)     Chief Complaint  Patient presents with   Ear Pain    Left ear pain    HPI:  Pt is a 89 y.o. female seen today for an acute visit for    Past Medical History:  Diagnosis Date   Allergy    Anemia    Anginal pain (HCC)    Anxiety    Arthritis    Atypical moles    Breast lump    left   Chronic kidney disease    Colon polyp    Depression    Diverticulitis    04-15-15 no recent issues   Dysrhythmia 01/2020   Edema    legs, hands  bilateral   GERD (gastroesophageal reflux disease)    hx of    Heart murmur 01/2020   High cholesterol    Per new patient form   Hypertension    Hypothyroidism    IBS (irritable bowel syndrome)    Per new patient form   Impaired  hearing    bilaterally some hearing loss-no hearing aids   Lactose intolerance    Per new patient form   Macular degeneration    Per new patient form   Mild cognitive impairment with memory loss 08/02/2000   per Dr Leigh, neuro, 04-15-15 no changes-stable   Recurrent upper respiratory infection (URI)    cold with sinus drainage x several months- saw PCP 10/08/11- chest x ray report on chart.  no fever,resolved, no recent issues as of 04-15-15.   Shoulder joint pain    04-07-15 tripped-loss balance, fell awkwardly and wrenched shoulder-left, Known bad right shoulderbone on bone   Skin lesion of breast    hx of right lateral breast  lesion-tx. with multipe rounds oral antibiotic- resolved, no reoccurrence.   Sleep apnea    Thyroid  disease    hypothyroidism   Past Surgical History:  Procedure Laterality Date   BREAST SURGERY     lumpectomy- left-benign   CATARACT EXTRACTION, BILATERAL Bilateral    CHOLECYSTECTOMY     laparoscopic   COLECTOMY  10/20/2011   sigmoid colectomy   COLONOSCOPY  2018   DG  BONE DENSITY ([facility] HX)  2021   Damien Gander, PA; [facility]/ Per new patient form   ESOPHAGOGASTRODUODENOSCOPY     EYE SURGERY  bilateral cataracts   HERNIA REPAIR     INCISIONAL HERNIA REPAIR N/A 01/12/2013   Procedure: LAPAROSCOPIC/OPEN REPAIR INCISIONAL HERNIA;  Surgeon: Morene ONEIDA Olives, MD;  Location: [facility];  Service: General;  Laterality: N/A;  With Mesh   INCISIONAL HERNIA REPAIR N/A 04/23/2015   Procedure: LAPAROSCOPIC ASSISTED INCISIONAL VENTRAL HERNIA REPAIR;  Surgeon: Morene Olives, MD;  Location: [facility];  Service: General;  Laterality: N/A;   INSERTION OF MESH N/A 04/23/2015   Procedure: INSERTION OF MESH;  Surgeon: Morene Olives, MD;  Location: [facility];  Service: General;  Laterality: N/A;   JOINT REPLACEMENT  05/2021   left hip   KYPHOPLASTY N/A 08/19/2021   Procedure: KYPHOPLASTY THORACIC TWELVE, LUMBAR ONE;  Surgeon: Mavis Purchase, MD;  Location: [facility]  OR;  Service: Neurosurgery;  Laterality: N/A;  KYPHOPLASTY THORACIC TWELVE, LUMBAR ONE   TOTAL HIP ARTHROPLASTY Left 2022   [facility]; Per new patient form   TUBAL LIGATION  1972    Allergies  Allergen Reactions   Penicillins Anaphylaxis   Codeine     Makes pt overly excited.... Anxious/ hyperactive    Nsaids     Burns stomach     Allergies as of 03/27/2024       Reactions   Penicillins Anaphylaxis   Codeine    Makes pt overly excited.... Anxious/ hyperactive   Nsaids    Burns stomach         Medication List        Accurate as of March 27, 2024  3:08 PM. If you have any questions, ask your nurse or doctor.          acetaminophen  650 MG CR tablet Commonly known as: TYLENOL  Take 650 mg by mouth 2 (two) times daily as needed for pain. 1,300mg  BID, oral, Twice a day - PRN, BiD PRN mild pain, may self administer   albuterol  108 (90 Base) MCG/ACT inhaler Commonly known as: VENTOLIN  HFA Inhale 2 puffs into the lungs every 6 (six) hours as needed for wheezing or shortness of breath.   AMLODIPINE  BESYLATE PO Take 2.5 mg by mouth 2 (two) times daily. What changed: Another medication with the same name was removed. Continue taking this medication, and follow the directions you see here. Changed by: Greig FORBES Cluster   amLODipine  5 MG tablet Commonly known as: NORVASC  TAKE 1 TABLET (5 MG TOTAL) BY MOUTH DAILY. What changed: Another medication with the same name was removed. Continue taking this medication, and follow the directions you see here. Changed by: Soliana Kitko E Sevyn Paredez   Biofreeze Professional 5 % Gel Generic drug: Menthol (Topical Analgesic) Apply topically. Small amount, topical may self administer roll on or ask for assister roll on or ask for assistance from staff. Roll on to right hip or back   CALCIUM  600 + D PO Take 1 tablet by mouth daily.   celecoxib  100 MG capsule Commonly known as: CELEBREX  Take 1 capsule (100 mg total) by mouth  in the morning and at bedtime.   clindamycin  300 MG capsule Commonly known as: CLEOCIN  Take 300 mg by mouth as needed. 300mg , oral, PRN Before any dental appointment, may self administer   clonazePAM  0.5 MG tablet Commonly known as: KLONOPIN  Take 1 tablet (0.5 mg total) by mouth 2 (two) times daily as needed for anxiety.   CORICIDIN COUGH/COLD 4-30 MG Tabs Generic drug: Chlorpheniramine-DM Take 1 tablet by mouth every 12 (twelve) hours as needed.   cyanocobalamin 100 MCG tablet Take  100 mcg by mouth 2 (two) times daily. 3000 mcg, oral   DULoxetine  60 MG capsule Commonly known as: CYMBALTA  TAKE 1 CAPSULE BY MOUTH EVERY DAY   fluticasone  50 MCG/ACT nasal spray Commonly known as: FLONASE  Place 2 sprays into both nostrils 2 (two) times daily for 14 days.   hyoscyamine  0.125 MG SL tablet Commonly known as: LEVSIN  SL Place 1 tablet (0.125 mg total) under the tongue daily as needed for cramping.   levothyroxine  50 MCG tablet Commonly known as: SYNTHROID  Take 1 tablet (50 mcg total) by mouth daily before breakfast.   lidocaine  4 % Place 1 patch onto the skin every 12 (twelve) hours as needed.   loratadine  10 MG tablet Commonly known as: CLARITIN  Take 10 mg by mouth daily.   LUBRICANT EYE DROPS OP Apply 1 drop to eye as needed.   Mucinex D Max Strength [phone] MG Tb12 Generic drug: Pseudoephedrine-Guaifenesin Take 1 tablet by mouth every 12 (twelve) hours as needed.   omeprazole  20 MG capsule Commonly known as: PRILOSEC TAKE 1 CAPSULE BY MOUTH EVERY DAY   polyethylene glycol 17 g packet Commonly known as: MIRALAX / GLYCOLAX Take 17 g by mouth daily as needed.   PRESERVISION AREDS 2+MULTI VIT PO Take 1 capsule by mouth daily.   PROBIOTIC PO Take 1 capsule by mouth every morning.   PROPYLENE GLYCOL OP Apply 1 drop to eye daily as needed.   rosuvastatin  5 MG tablet Commonly known as: CRESTOR  Take 1 tablet (5 mg total) by mouth daily. E78.00   tiZANidine  4 MG  tablet Commonly known as: ZANAFLEX  TAKE 0.5 TABLETS (2 MG TOTAL) BY MOUTH 2 (TWO) TIMES DAILY AS NEEDED FOR MUSCLE SPASMS.   traMADol  50 MG tablet Commonly known as: ULTRAM  Take 1-2 tablets (50-100 mg total) by mouth every 12 (twelve) hours as needed.   triamcinolone  cream 0.1 % Commonly known as: KENALOG Apply 1 Application topically every 12 (twelve) hours as needed.   triamterene -hydrochlorothiazide  37.5-25 MG capsule Commonly known as: DYAZIDE  TAKE 1 CAPSULE BY MOUTH EVERY MORNING        Review of Systems  Immunization History  Administered Date(s) Administered   Fluad Quad(high Dose 65+) 05/07/2022   INFLUENZA, HIGH DOSE SEASONAL PF 04/19/2019, 04/18/2020, 06/17/2021   Influenza-Unspecified 04/19/2019, 04/18/2020, 06/17/2021   Moderna Covid-19 Vaccine Bivalent Booster 33yrs & up 08/16/2019, 09/12/2019, 06/29/2021   Moderna Sars-Covid-2 Vaccination 06/17/2020, 03/09/2021, 08/06/2021, 12/17/2021   Novel Infuenza-h1n1-09 07/09/2008   Pneumococcal Conjugate-13 12/20/2013   Pneumococcal Polysaccharide-23 08/17/2012   Tdap 08/16/2022   Tetanus 08/16/2022   Zoster Recombinant(Shingrix) 01/30/2014, 12/18/2020, 04/18/2021   Pertinent  Health Maintenance Due  Topic Date Due   INFLUENZA VACCINE  03/02/2024   DEXA SCAN  Completed      01/31/2023    1:33 PM 02/02/2023   11:44 AM 03/22/2023    8:19 AM 05/31/2023   11:04 AM 09/13/2023    2:46 PM  Fall Risk  Falls in the past year? 1 1 0 0 0  Was there an injury with Fall? 1 0 0 0 0  Fall Risk Category Calculator 2 2 0 0 0  Patient at Risk for Falls Due to History of fall(s);Impaired balance/gait;Impaired mobility History of fall(s);Impaired balance/gait;Impaired mobility History of fall(s);Impaired balance/gait;Impaired mobility No Fall Risks No Fall Risks  Fall risk Follow up Falls evaluation completed Falls evaluation completed;Education provided;Falls prevention discussed Falls evaluation completed Falls evaluation completed  Falls evaluation completed   Functional Status Survey:    Vitals:  03/27/24 1454  BP: 135/75  Pulse: 90  Resp: 17  Temp: 98.8 F (37.1 C)  SpO2: 97%  Weight: 168 lb (76.2 kg)  Height: 5' 2 (1.575 m)   Body mass index is 30.73 kg/m. Physical Exam  Labs reviewed: Recent Labs    08/18/23 0000  NA 134*  K 4.2  CL 97*  CO2 27*  BUN 17  CREATININE 0.9  CALCIUM  8.8   Recent Labs    08/18/23 0000  AST 15  ALT 14  ALKPHOS 109  ALBUMIN 3.9   Recent Labs    05/24/23 0000 08/18/23 0000  WBC 6.3 6.2  HGB 12.5 12.3  HCT 36 35*  PLT 311 295   Lab Results  Component Value Date   TSH 0.59 12/08/2023   No results found for: HGBA1C Lab Results  Component Value Date   CHOL 196 12/08/2023   HDL 58 12/08/2023   LDLCALC 99 12/08/2023   TRIG 198 (A) 12/08/2023    Significant Diagnostic Results in last 30 days:  No results found.  Assessment/Plan 1. Acute ear pain, left (Primary)     Family/ staff Communication:   Labs/tests ordered:

## 2024-03-28 DIAGNOSIS — G894 Chronic pain syndrome: Secondary | ICD-10-CM | POA: Diagnosis not present

## 2024-03-28 DIAGNOSIS — M47816 Spondylosis without myelopathy or radiculopathy, lumbar region: Secondary | ICD-10-CM | POA: Diagnosis not present

## 2024-03-31 ENCOUNTER — Encounter: Payer: Self-pay | Admitting: Adult Health

## 2024-03-31 ENCOUNTER — Telehealth: Payer: Self-pay | Admitting: Adult Health

## 2024-03-31 NOTE — Telephone Encounter (Signed)
 Charge nurse called on-call provider regarding rashes on face. Clindamycin  was started 3 days ago for tooth infection. Discontinued Clindamycin  and started patient on Doxycycline 100 mg BID X 5 days.

## 2024-04-01 ENCOUNTER — Other Ambulatory Visit: Payer: Self-pay

## 2024-04-01 ENCOUNTER — Emergency Department (HOSPITAL_COMMUNITY)
Admission: EM | Admit: 2024-04-01 | Discharge: 2024-04-02 | Disposition: A | Discharge status: Home / Self Care | Attending: Emergency Medicine | Admitting: Emergency Medicine

## 2024-04-01 DIAGNOSIS — Z79899 Other long term (current) drug therapy: Secondary | ICD-10-CM | POA: Diagnosis not present

## 2024-04-01 DIAGNOSIS — B029 Zoster without complications: Secondary | ICD-10-CM | POA: Insufficient documentation

## 2024-04-01 DIAGNOSIS — I129 Hypertensive chronic kidney disease with stage 1 through stage 4 chronic kidney disease, or unspecified chronic kidney disease: Secondary | ICD-10-CM | POA: Insufficient documentation

## 2024-04-01 DIAGNOSIS — N189 Chronic kidney disease, unspecified: Secondary | ICD-10-CM | POA: Diagnosis not present

## 2024-04-01 LAB — COMPREHENSIVE METABOLIC PANEL WITH GFR
ALT: 20 U/L (ref 0–44)
AST: 20 U/L (ref 15–41)
Albumin: 4.1 g/dL (ref 3.5–5.0)
Alkaline Phosphatase: 79 U/L (ref 38–126)
Anion gap: 12 (ref 5–15)
BUN: 16 mg/dL (ref 8–23)
CO2: 23 mmol/L (ref 22–32)
Calcium: 9.3 mg/dL (ref 8.9–10.3)
Chloride: 92 mmol/L — ABNORMAL LOW (ref 98–111)
Creatinine, Ser: 1.08 mg/dL — ABNORMAL HIGH (ref 0.44–1.00)
GFR, Estimated: 50 mL/min — ABNORMAL LOW (ref >60)
Glucose, Bld: 112 mg/dL — ABNORMAL HIGH (ref 70–99)
Potassium: 4.1 mmol/L (ref 3.5–5.1)
Sodium: 127 mmol/L — ABNORMAL LOW (ref 135–145)
Total Bilirubin: 0.9 mg/dL (ref 0.0–1.2)
Total Protein: 6.6 g/dL (ref 6.5–8.1)

## 2024-04-01 LAB — CBC WITH DIFFERENTIAL/PLATELET
Abs Immature Granulocytes: 0.02 K/uL (ref 0.00–0.07)
Basophils Absolute: 0.1 K/uL (ref 0.0–0.1)
Basophils Relative: 1 %
Eosinophils Absolute: 0.2 K/uL (ref 0.0–0.5)
Eosinophils Relative: 3 %
HCT: 39.3 % (ref 36.0–46.0)
Hemoglobin: 13.7 g/dL (ref 12.0–15.0)
Immature Granulocytes: 0 %
Lymphocytes Relative: 16 %
Lymphs Abs: 1 K/uL (ref 0.7–4.0)
MCH: 32.4 pg (ref 26.0–34.0)
MCHC: 34.9 g/dL (ref 30.0–36.0)
MCV: 92.9 fL (ref 80.0–100.0)
Monocytes Absolute: 0.7 K/uL (ref 0.1–1.0)
Monocytes Relative: 11 %
Neutro Abs: 4.4 K/uL (ref 1.7–7.7)
Neutrophils Relative %: 69 %
Platelets: 241 K/uL (ref 150–400)
RBC: 4.23 MIL/uL (ref 3.87–5.11)
RDW: 12.3 % (ref 11.5–15.5)
WBC: 6.3 K/uL (ref 4.0–10.5)
nRBC: 0 % (ref 0.0–0.2)

## 2024-04-01 NOTE — ED Triage Notes (Signed)
 C/o shingles on left side of face x a couple days. Pt PCP sent her her for IV medications.

## 2024-04-02 MED ORDER — TETRACAINE HCL 0.5 % OP SOLN
2.0000 [drp] | Freq: Once | OPHTHALMIC | Status: AC
  Administered 2024-04-02: 2 [drp] via OPHTHALMIC
  Filled 2024-04-02: qty 4

## 2024-04-02 MED ORDER — FLUORESCEIN SODIUM 1 MG OP STRP
1.0000 | ORAL_STRIP | Freq: Once | OPHTHALMIC | Status: AC
  Administered 2024-04-02: 1 via OPHTHALMIC
  Filled 2024-04-02: qty 1

## 2024-04-02 MED ORDER — ERYTHROMYCIN 5 MG/GM OP OINT: TOPICAL_OINTMENT | OPHTHALMIC | 0 refills | Status: DC

## 2024-04-02 MED ORDER — VALACYCLOVIR HCL 1 G PO TABS: 1000.0000 mg | ORAL_TABLET | Freq: Three times a day (TID) | ORAL | 0 refills | Status: DC

## 2024-04-02 NOTE — ED Provider Notes (Signed)
 [place] EMERGENCY DEPARTMENT AT [facility] Provider Note   CSN: [id number] Arrival date & time: 04/01/24  2237     Patient presents with: Chief complaint: Shingles  Sophia Nash is a 89 y.o. female.   The history is provided by the patient.   She has history of hypertension, hyperlipidemia, chronic kidney disease and comes in because of shingles involving the left side of her face.  She started having some left ear and mouth pain about 5 days ago and saw her dentist who thought she might have a dental infection and started her on clindamycin .  She also saw her primary care provider did not note any issues.  She started breaking out on her forehead yesterday and was given a prescription for acyclovir by her primary care provider today.  Acyclovir dose was 500 mg twice daily.  Her primary care provider suggest that she come to the emergency department for IV antiviral treatment.  She does complain of some facial pain, but states that her home pain medication has been adequate.    Prior to Admission medications   Medication Sig Start Date End Date Taking? Authorizing Provider  acetaminophen  (TYLENOL ) 650 MG CR tablet Take 650 mg by mouth 2 (two) times daily as needed for pain. 1,300mg  BID, oral, Twice a day - PRN, BiD PRN mild pain, may self administer    [provider]  albuterol  (VENTOLIN  HFA) 108 (90 Base) MCG/ACT inhaler Inhale 2 puffs into the lungs every 6 (six) hours as needed for wheezing or shortness of breath. 07/29/22   Darlean Maus, NP  AMLODIPINE  BESYLATE PO Take 2.5 mg by mouth 2 (two) times daily.    [provider]  Calcium  Carb-Cholecalciferol (CALCIUM  600 + D PO) Take 1 tablet by mouth daily.    [provider]  Carboxymethylcellulose Sodium (LUBRICANT EYE DROPS OP) Apply 1 drop to eye as needed.    [provider]  celecoxib  (CELEBREX ) 100 MG capsule Take 1 capsule (100 mg total) by mouth in the morning and at bedtime.  12/19/23   Darlean Maus, NP  cetirizine  (ZYRTEC ) 10 MG tablet Take 1 tablet (10 mg total) by mouth daily for 14 days. 03/27/24 04/10/24  Fargo, Amy E, NP  Chlorpheniramine-DM (CORICIDIN COUGH/COLD) 4-30 MG TABS Take 1 tablet by mouth every 12 (twelve) hours as needed.    [provider]  clindamycin  (CLEOCIN ) 300 MG capsule Take 300 mg by mouth as needed. 300mg , oral, PRN Before any dental appointment, may self administer    [provider]  clonazePAM  (KLONOPIN ) 0.5 MG tablet Take 1 tablet (0.5 mg total) by mouth 2 (two) times daily as needed for anxiety. 10/28/23   Wert, Christina, NP  cyanocobalamin 100 MCG tablet Take 100 mcg by mouth 2 (two) times daily. 3000 mcg, oral    [provider]  DULoxetine  (CYMBALTA ) 60 MG capsule TAKE 1 CAPSULE BY MOUTH EVERY DAY 03/12/24   Charlanne Fredia CROME, MD  fluticasone  (FLONASE ) 50 MCG/ACT nasal spray Place 2 sprays into both nostrils 2 (two) times daily for 14 days. 12/12/23 12/26/23  Darlean Maus, NP  hyoscyamine  (LEVSIN  SL) 0.125 MG SL tablet Place 1 tablet (0.125 mg total) under the tongue daily as needed for cramping. 03/16/23   Charlanne Fredia CROME, MD  levothyroxine  (SYNTHROID ) 50 MCG tablet Take 1 tablet (50 mcg total) by mouth daily before breakfast. 12/19/23   Darlean Maus, NP  lidocaine  4 % Place 1 patch onto the skin every 12 (twelve)  hours as needed.    [provider]  loratadine  (CLARITIN ) 10 MG tablet Take 10 mg by mouth daily. Hold x 14 days    [provider]  Menthol, Topical Analgesic, (BIOFREEZE PROFESSIONAL) 5 % GEL Apply topically. Small amount, topical may self administer roll on or ask for assister roll on or ask for assistance from staff. Roll on to right hip or back    [provider]  Multiple Vitamins-Minerals (PRESERVISION AREDS 2+MULTI VIT PO) Take 1 capsule by mouth daily.    [provider]  omeprazole  (PRILOSEC) 20 MG capsule TAKE 1 CAPSULE BY MOUTH EVERY DAY 03/12/24   Gupta,  Anjali L, MD  polyethylene glycol (MIRALAX / GLYCOLAX) 17 g packet Take 17 g by mouth daily as needed.    [provider]  Probiotic Product (PROBIOTIC PO) Take 1 capsule by mouth every morning.    [provider]  PROPYLENE GLYCOL OP Apply 1 drop to eye daily as needed.    [provider]  Pseudoephedrine-Guaifenesin (MUCINEX D MAX STRENGTH) [phone] MG TB12 Take 1 tablet by mouth every 12 (twelve) hours as needed.    [provider]  rosuvastatin  (CRESTOR ) 5 MG tablet Take 1 tablet (5 mg total) by mouth daily. E78.00 12/19/23   Charlanne Fredia CROME, MD  tiZANidine  (ZANAFLEX ) 4 MG tablet TAKE 0.5 TABLETS (2 MG TOTAL) BY MOUTH 2 (TWO) TIMES DAILY AS NEEDED FOR MUSCLE SPASMS. 01/11/24   Charlanne Fredia CROME, MD  traMADol  (ULTRAM ) 50 MG tablet Take 1-2 tablets (50-100 mg total) by mouth every 12 (twelve) hours as needed. 09/06/23   Vernetta Lonni GRADE, MD  triamcinolone  cream (KENALOG) 0.1 % Apply 1 Application topically every 12 (twelve) hours as needed.    [provider]  triamterene -hydrochlorothiazide  (DYAZIDE ) 37.5-25 MG capsule TAKE 1 CAPSULE BY MOUTH EVERY MORNING 09/26/23   Gupta, Anjali L, MD    Allergies: Penicillins, Codeine, Nsaids, and Clindamycin /lincomycin    Review of Systems  All other systems reviewed and are negative.   Updated Vital Signs BP (!) 166/90 (BP Location: Left Arm)   Pulse 88   Temp 98.6 F (37 C)   Resp 16   SpO2 97%   Physical Exam Vitals and nursing note reviewed.   89 year old female, resting comfortably and in no acute distress. Vital signs are significant for elevated blood pressure. Oxygen saturation is 97%, which is normal. Head is normocephalic and atraumatic. PERRLA, EOMI. vesicular rash on the left side of the forehead and left malar area is present consistent with herpes zoster.  Some vesicles also present on the lip and buccal mucosa on the left.  There is mild erythema of the conjunctiva on the left.  I  stained the eye with fluorescein  and examined with a Woods lamp, and noted no abnormal uptake of the left cornea.  Ocular pressure via Tono-Pen was 36.  Visual acuity is 20/25 in the right eye, 20/40 in the left eye, 20/32 with both eyes. Neck is nontender and supple without adenopathy Lungs are clear without rales, wheezes, or rhonchi. Chest is nontender. Heart has regular rate and rhythm without murmur. Abdomen is soft, flat, nontender. Skin is warm and dry without rash. Neurologic: Awake and alert, moves all extremities equally.      (all labs ordered are listed, but only abnormal results are displayed) Labs Reviewed  COMPREHENSIVE METABOLIC PANEL WITH GFR - Abnormal; Notable for the following components:      Result Value   Sodium  127 (*)    Chloride 92 (*)    Glucose, Bld 112 (*)    Creatinine, Ser 1.08 (*)    GFR, Estimated 50 (*)    All other components within normal limits  CBC WITH DIFFERENTIAL/PLATELET      Procedures   Medications Ordered in the ED - No data to display                                  Medical Decision Making Amount and/or Complexity of Data Reviewed Labs: ordered.  Risk Prescription drug management.   Herpes zoster with concern for early ocular involvement, although no abnormal uptake of fluorescein  seen.  She is on a subtherapeutic dose of antivirals.  I have discussed the case with Dr. Waylan, on-call for ophthalmology.  I will switch her to valacyclovir  1 g 3 times daily for 7 days and also give a prescription for erythromycin  ointment.  She will need to follow-up with him at 8:15 AM on 04/03/2024.  I have reviewed her laboratory tests, and my interpretation is borderline renal insufficiency, chronic hyponatremia slightly worse than baseline but not clinically significant, elevated random glucose level which will need to be followed as an outpatient, normal CBC.      Final diagnoses:  Herpes zoster without complication    ED Discharge  Orders          Ordered    valACYclovir  (VALTREX ) 1000 MG tablet  3 times daily        04/02/24 0047    erythromycin  ophthalmic ointment        04/02/24 0047               Raford Lenis, MD 04/02/24 [phone]

## 2024-04-02 NOTE — ED Provider Notes (Incomplete)
 [place] EMERGENCY DEPARTMENT AT [facility] Provider Note   CSN: [id number] Arrival date & time: 04/01/24  2237     Patient presents with: No chief complaint on file.   Sophia Nash is a 89 y.o. female.  {Add pertinent medical, surgical, social history, OB history to YEP:[id number]} The history is provided by the patient.   She has history of hypertension, hyperlipidemia, chronic kidney disease    Prior to Admission medications   Medication Sig Start Date End Date Taking? Authorizing Provider  acetaminophen  (TYLENOL ) 650 MG CR tablet Take 650 mg by mouth 2 (two) times daily as needed for pain. 1,300mg  BID, oral, Twice a day - PRN, BiD PRN mild pain, may self administer    [provider]  albuterol  (VENTOLIN  HFA) 108 (90 Base) MCG/ACT inhaler Inhale 2 puffs into the lungs every 6 (six) hours as needed for wheezing or shortness of breath. 07/29/22   Darlean Maus, NP  AMLODIPINE  BESYLATE PO Take 2.5 mg by mouth 2 (two) times daily.    [provider]  Calcium  Carb-Cholecalciferol (CALCIUM  600 + D PO) Take 1 tablet by mouth daily.    [provider]  Carboxymethylcellulose Sodium (LUBRICANT EYE DROPS OP) Apply 1 drop to eye as needed.    [provider]  celecoxib  (CELEBREX ) 100 MG capsule Take 1 capsule (100 mg total) by mouth in the morning and at bedtime. 12/19/23   Darlean Maus, NP  cetirizine  (ZYRTEC ) 10 MG tablet Take 1 tablet (10 mg total) by mouth daily for 14 days. 03/27/24 04/10/24  Fargo, Amy E, NP  Chlorpheniramine-DM (CORICIDIN COUGH/COLD) 4-30 MG TABS Take 1 tablet by mouth every 12 (twelve) hours as needed.    [provider]  clindamycin  (CLEOCIN ) 300 MG capsule Take 300 mg by mouth as needed. 300mg , oral, PRN Before any dental appointment, may self administer    [provider]  clonazePAM  (KLONOPIN ) 0.5 MG tablet Take 1 tablet (0.5 mg total) by mouth 2 (two) times daily as needed for anxiety. 10/28/23    Wert, Christina, NP  cyanocobalamin 100 MCG tablet Take 100 mcg by mouth 2 (two) times daily. 3000 mcg, oral    [provider]  DULoxetine  (CYMBALTA ) 60 MG capsule TAKE 1 CAPSULE BY MOUTH EVERY DAY 03/12/24   Charlanne Fredia CROME, MD  fluticasone  (FLONASE ) 50 MCG/ACT nasal spray Place 2 sprays into both nostrils 2 (two) times daily for 14 days. 12/12/23 12/26/23  Darlean Maus, NP  hyoscyamine  (LEVSIN  SL) 0.125 MG SL tablet Place 1 tablet (0.125 mg total) under the tongue daily as needed for cramping. 03/16/23   Charlanne Fredia CROME, MD  levothyroxine  (SYNTHROID ) 50 MCG tablet Take 1 tablet (50 mcg total) by mouth daily before breakfast. 12/19/23   Darlean Maus, NP  lidocaine  4 % Place 1 patch onto the skin every 12 (twelve) hours as needed.    [provider]  loratadine  (CLARITIN ) 10 MG tablet Take 10 mg by mouth daily. Hold x 14 days    [provider]  Menthol, Topical Analgesic, (BIOFREEZE PROFESSIONAL) 5 % GEL Apply topically. Small amount, topical may self administer roll on or ask for assister roll on or ask for assistance from staff. Roll on to right hip or back    [provider]  Multiple Vitamins-Minerals (PRESERVISION AREDS 2+MULTI VIT PO) Take 1 capsule by mouth daily.    [provider]  omeprazole  (PRILOSEC) 20 MG capsule TAKE 1 CAPSULE BY MOUTH EVERY DAY 03/12/24  Charlanne Fredia CROME, MD  polyethylene glycol (MIRALAX / GLYCOLAX) 17 g packet Take 17 g by mouth daily as needed.    [provider]  Probiotic Product (PROBIOTIC PO) Take 1 capsule by mouth every morning.    [provider]  PROPYLENE GLYCOL OP Apply 1 drop to eye daily as needed.    [provider]  Pseudoephedrine-Guaifenesin (MUCINEX D MAX STRENGTH) [phone] MG TB12 Take 1 tablet by mouth every 12 (twelve) hours as needed.    [provider]  rosuvastatin  (CRESTOR ) 5 MG tablet Take 1 tablet (5 mg total) by mouth daily. E78.00 12/19/23   Charlanne Fredia CROME,  MD  tiZANidine  (ZANAFLEX ) 4 MG tablet TAKE 0.5 TABLETS (2 MG TOTAL) BY MOUTH 2 (TWO) TIMES DAILY AS NEEDED FOR MUSCLE SPASMS. 01/11/24   Charlanne Fredia CROME, MD  traMADol  (ULTRAM ) 50 MG tablet Take 1-2 tablets (50-100 mg total) by mouth every 12 (twelve) hours as needed. 09/06/23   Vernetta Lonni GRADE, MD  triamcinolone  cream (KENALOG) 0.1 % Apply 1 Application topically every 12 (twelve) hours as needed.    [provider]  triamterene -hydrochlorothiazide  (DYAZIDE ) 37.5-25 MG capsule TAKE 1 CAPSULE BY MOUTH EVERY MORNING 09/26/23   Gupta, Anjali L, MD    Allergies: Penicillins, Codeine, Nsaids, and Clindamycin /lincomycin    Review of Systems  All other systems reviewed and are negative.   Updated Vital Signs BP (!) 166/90 (BP Location: Left Arm)   Pulse 88   Temp 98.6 F (37 C)   Resp 16   SpO2 97%   Physical Exam Vitals and nursing note reviewed.   89 year old female, resting comfortably and in no acute distress. Vital signs are ***. Oxygen saturation is ***%, which is normal. Head is normocephalic and atraumatic. PERRLA, EOMI. Oropharynx is clear. Neck is nontender and supple without adenopathy or JVD. Back is nontender and there is no CVA tenderness. Lungs are clear without rales, wheezes, or rhonchi. Chest is nontender. Heart has regular rate and rhythm without murmur. Abdomen is soft, flat, nontender without masses or hepatosplenomegaly and peristalsis is normoactive. Extremities have no cyanosis or edema, full range of motion is present. Skin is warm and dry without rash. Neurologic: Mental status is normal, cranial nerves are intact, there are no motor or sensory deficits.  (all labs ordered are listed, but only abnormal results are displayed) Labs Reviewed  COMPREHENSIVE METABOLIC PANEL WITH GFR - Abnormal; Notable for the following components:      Result Value   Sodium 127 (*)    Chloride 92 (*)    Glucose, Bld 112 (*)    Creatinine, Ser 1.08 (*)    GFR,  Estimated 50 (*)    All other components within normal limits  CBC WITH DIFFERENTIAL/PLATELET    EKG: None  Radiology: No results found.  {Document cardiac monitor, telemetry assessment procedure when appropriate:32947} Procedures   Medications Ordered in the ED - No data to display    {Click here for ABCD2, HEART and other calculators REFRESH Note before signing:1}                              Medical Decision Making Amount and/or Complexity of Data Reviewed Labs: ordered.   ***  {Document critical care time when appropriate  Document review of labs and clinical decision tools ie [id number], etc  Document your independent review of radiology images and any outside records  Document your discussion with  family members, caretakers and with consultants  Document social determinants of health affecting pt's care  Document your decision making why or why not admission, treatments were needed:32947:::1}   Final diagnoses:  None    ED Discharge Orders     None

## 2024-04-02 NOTE — Discharge Instructions (Signed)
 Continue taking your pain medication as needed.  Please see the eye doctor at 8:15 AM on Tuesday morning, September 2.  He is expecting you, no need to call the office before you go.

## 2024-04-03 ENCOUNTER — Other Ambulatory Visit (HOSPITAL_BASED_OUTPATIENT_CLINIC_OR_DEPARTMENT_OTHER): Payer: Self-pay

## 2024-04-03 ENCOUNTER — Non-Acute Institutional Stay: Payer: Self-pay | Admitting: Orthopedic Surgery

## 2024-04-03 ENCOUNTER — Encounter: Payer: Self-pay | Admitting: Orthopedic Surgery

## 2024-04-03 ENCOUNTER — Telehealth: Payer: Self-pay

## 2024-04-03 DIAGNOSIS — B0231 Zoster conjunctivitis: Secondary | ICD-10-CM

## 2024-04-03 DIAGNOSIS — B0239 Other herpes zoster eye disease: Secondary | ICD-10-CM | POA: Diagnosis not present

## 2024-04-03 MED ORDER — VALACYCLOVIR HCL 1 G PO TABS: 1000.0000 mg | ORAL_TABLET | Freq: Three times a day (TID) | ORAL | Status: DC

## 2024-04-03 MED ORDER — LIDOCAINE VISCOUS HCL 2 % MT SOLN
5.0000 mL | Freq: Four times a day (QID) | OROMUCOSAL | 3 refills | Status: DC | PRN
Start: 2024-04-03 — End: 2024-04-17
  Filled 2024-04-03: qty 100, 5d supply, fill #0

## 2024-04-03 MED ORDER — ERYTHROMYCIN 5 MG/GM OP OINT: TOPICAL_OINTMENT | OPHTHALMIC | Status: DC

## 2024-04-03 MED ORDER — LIDOCAINE-PRILOCAINE 2.5-2.5 % EX CREA
TOPICAL_CREAM | CUTANEOUS | 3 refills | Status: DC
  Filled 2024-04-03: qty 30, 30d supply, fill #0

## 2024-04-03 NOTE — Progress Notes (Unsigned)
 Location:  [facility] Room Number: 533-P Place of Service:  ALF [phone]) Provider:  Greig CHARLENA Cluster, NP    Patient Care Team: Charlanne Fredia CROME, MD as PCP - General (Internal Medicine) Charlanne Fredia CROME, MD as Consulting Physician (Internal Medicine) Bertrand Gowda, MD as Referring Physician (Obstetrics and Gynecology) Kate Lonni CROME, MD as Consulting Physician (Cardiology)  Extended Emergency Contact Information Primary Emergency Contact: East,Teresa Address: [address]          [place], [place] [address] [place] Home Phone: [phone] Mobile Phone: [phone] Relation: Daughter Secondary Emergency Contact: Donzetta Brinda Slough Address: [address]          [place], [place] [address] [place] Home Phone: [phone] Mobile Phone: [phone] Relation: Daughter  Code Status:  Full Code  Goals of care: Advanced Directive information    04/01/2024   10:41 PM  Advanced Directives  Does Patient Have a Medical Advance Directive? No     Chief Complaint  Patient presents with   Herpes Zoster    HPI:  Pt is a 89 y.o. female seen today for an acute visit for    Past Medical History:  Diagnosis Date   Allergy    Anemia    Anginal pain (HCC)    Anxiety    Arthritis    Atypical moles    Breast lump    left   Chronic kidney disease    Colon polyp    Depression    Diverticulitis    04-15-15 no recent issues   Dysrhythmia 01/2020   Edema    legs, hands  bilateral   GERD (gastroesophageal reflux disease)    hx of    Heart murmur 01/2020   High cholesterol    Per new patient form   Hypertension    Hypothyroidism    IBS (irritable bowel syndrome)    Per new patient form   Impaired hearing    bilaterally some hearing loss-no hearing aids   Lactose intolerance    Per new patient form   Macular degeneration    Per new patient form   Mild cognitive impairment with memory loss 08/02/2000   per  Dr Leigh, neuro, 04-15-15 no changes-stable   Recurrent upper respiratory infection (URI)    cold with sinus drainage x several months- saw PCP 10/08/11- chest x ray report on chart.  no fever,resolved, no recent issues as of 04-15-15.   Shoulder joint pain    04-07-15 tripped-loss balance, fell awkwardly and wrenched shoulder-left, Known bad right shoulderbone on bone   Skin lesion of breast    hx of right lateral breast  lesion-tx. with multipe rounds oral antibiotic- resolved, no reoccurrence.   Sleep apnea    Thyroid  disease    hypothyroidism   Past Surgical History:  Procedure Laterality Date   BREAST SURGERY     lumpectomy- left-benign   CATARACT EXTRACTION, BILATERAL Bilateral    CHOLECYSTECTOMY     laparoscopic   COLECTOMY  10/20/2011   sigmoid colectomy   COLONOSCOPY  2018   DG  BONE DENSITY ([facility] HX)  2021   Damien Gander, PA; [facility]/ Per new patient form   ESOPHAGOGASTRODUODENOSCOPY     EYE SURGERY     bilateral cataracts   HERNIA REPAIR     INCISIONAL HERNIA REPAIR N/A 01/12/2013   Procedure: LAPAROSCOPIC/OPEN REPAIR INCISIONAL HERNIA;  Surgeon: Morene ONEIDA Olives, MD;  Location: [facility];  Service: General;  Laterality: N/A;  With Mesh   INCISIONAL HERNIA REPAIR N/A 04/23/2015   Procedure: LAPAROSCOPIC ASSISTED INCISIONAL VENTRAL HERNIA REPAIR;  Surgeon: Morene Olives, MD;  Location: [facility];  Service: General;  Laterality: N/A;   INSERTION OF MESH N/A 04/23/2015   Procedure: INSERTION OF MESH;  Surgeon: Morene Olives, MD;  Location: [facility];  Service: General;  Laterality: N/A;   JOINT REPLACEMENT  05/2021   left hip   KYPHOPLASTY N/A 08/19/2021   Procedure: KYPHOPLASTY THORACIC TWELVE, LUMBAR ONE;  Surgeon: Mavis Purchase, MD;  Location: [facility];  Service: Neurosurgery;  Laterality: N/A;  KYPHOPLASTY THORACIC TWELVE, LUMBAR ONE   TOTAL HIP ARTHROPLASTY Left 2022   [facility]; Per new patient form   TUBAL LIGATION  1972     Allergies  Allergen Reactions   Penicillins Anaphylaxis   Codeine     Makes pt overly excited.... Anxious/ hyperactive    Nsaids     Burns stomach    Clindamycin /Lincomycin Rash    Outpatient Encounter Medications as of 04/03/2024  Medication Sig   acetaminophen  (TYLENOL ) 650 MG CR tablet Take 650 mg by mouth 2 (two) times daily as needed for pain. 1,300mg  BID, oral, Twice a day - PRN, BiD PRN mild pain, may self administer   albuterol  (VENTOLIN  HFA) 108 (90 Base) MCG/ACT inhaler Inhale 2 puffs into the lungs every 6 (six) hours as needed for wheezing or shortness of breath.   AMLODIPINE  BESYLATE PO Take 2.5 mg by mouth every morning. And 2 tablets (5 mg ) in the evening   Calcium  Carb-Cholecalciferol (CALCIUM  600 + D PO) Take 1 tablet by mouth daily.   Carboxymethylcellulose Sodium (LUBRICANT EYE DROPS OP) Apply 1 drop to eye as needed.   celecoxib  (CELEBREX ) 100 MG capsule Take 1 capsule (100 mg total) by mouth in the morning and at bedtime.   Chlorpheniramine-DM (CORICIDIN COUGH/COLD) 4-30 MG TABS Take 1 tablet by mouth every 12 (twelve) hours as needed.   clindamycin  (CLEOCIN ) 300 MG capsule Take 300 mg by mouth as needed. 300mg , oral, PRN Before any dental appointment, may self administer   clonazePAM  (KLONOPIN ) 0.5 MG tablet Take 1 tablet (0.5 mg total) by mouth 2 (two) times daily as needed for anxiety.   Cobalamin Combinations (COBALAMINE COMBINATIONS PO) Take 3,000 mcg by mouth daily. In the afternoon for polyarthritis   cyanocobalamin 100 MCG tablet Take 100 mcg by mouth 2 (two) times daily. 3000 mcg, oral   doxycycline (VIBRA-TABS) 100 MG tablet Take 100 mg by mouth 2 (two) times daily. X 5 days   DULoxetine  (CYMBALTA ) 60 MG capsule TAKE 1 CAPSULE BY MOUTH EVERY DAY   hyoscyamine  (LEVSIN  SL) 0.125 MG SL tablet Place 0.125 mg under the tongue every 4 (four) hours as needed for cramping.   levothyroxine  (SYNTHROID ) 50 MCG tablet Take 1 tablet (50 mcg total) by mouth daily  before breakfast.   lidocaine  4 % Place 1 patch onto the skin every 12 (twelve) hours as needed.   loratadine  (CLARITIN ) 10 MG tablet Take 10 mg by mouth daily. Hold x 14 days   Menthol, Topical Analgesic, (BIOFREEZE PROFESSIONAL) 5 % GEL Apply topically. Small amount, topical may self administer roll on or ask for assister roll on or ask for assistance from staff. Roll on to right hip or back   Multiple Vitamins-Minerals (PRESERVISION AREDS 2+MULTI VIT PO) Take 1 capsule by mouth daily.   omeprazole  (PRILOSEC) 20 MG capsule TAKE 1 CAPSULE BY MOUTH EVERY DAY   polyethylene glycol (  MIRALAX / GLYCOLAX) 17 g packet Take 17 g by mouth daily as needed.   Probiotic Product (PROBIOTIC PO) Take 1 capsule by mouth every morning.   Pseudoephedrine-Guaifenesin (MUCINEX D MAX STRENGTH) [phone] MG TB12 Take 1 tablet by mouth every 12 (twelve) hours as needed.   rosuvastatin  (CRESTOR ) 5 MG tablet Take 1 tablet (5 mg total) by mouth daily. E78.00   tiZANidine  (ZANAFLEX ) 4 MG tablet TAKE 0.5 TABLETS (2 MG TOTAL) BY MOUTH 2 (TWO) TIMES DAILY AS NEEDED FOR MUSCLE SPASMS.   traMADol  (ULTRAM ) 50 MG tablet Take 1-2 tablets (50-100 mg total) by mouth every 12 (twelve) hours as needed.   triamcinolone  cream (KENALOG) 0.1 % Apply 1 Application topically every 12 (twelve) hours as needed.   triamterene -hydrochlorothiazide  (DYAZIDE ) 37.5-25 MG capsule TAKE 1 CAPSULE BY MOUTH EVERY MORNING   UNABLE TO FIND Med Name:   CBD gummy, administer 750 mg; 1 gummy oral Special Instructions: 1 gummy qhs/prn for insomnia, may self-administer at bedtime. every 24 hours as needed for insomnia May self-administer   cetirizine  (ZYRTEC ) 10 MG tablet Take 1 tablet (10 mg total) by mouth daily for 14 days. (Patient not taking: Reported on 04/03/2024)   erythromycin  ophthalmic ointment Place a 1/2 inch ribbon of ointment into the lower eyelid Q 6 hours. (Patient not taking: Reported on 04/03/2024)   fluticasone  (FLONASE ) 50 MCG/ACT nasal spray  Place 2 sprays into both nostrils 2 (two) times daily for 14 days. (Patient not taking: Reported on 04/03/2024)   hyoscyamine  (LEVSIN  SL) 0.125 MG SL tablet Place 1 tablet (0.125 mg total) under the tongue daily as needed for cramping. (Patient not taking: Reported on 04/03/2024)   PROPYLENE GLYCOL OP Apply 1 drop to eye daily as needed. (Patient not taking: Reported on 04/03/2024)   valACYclovir  (VALTREX ) 1000 MG tablet Take 1 tablet (1,000 mg total) by mouth 3 (three) times daily.   No facility-administered encounter medications on file as of 04/03/2024.    Review of Systems  Immunization History  Administered Date(s) Administered   Fluad Quad(high Dose 65+) 05/07/2022   INFLUENZA, HIGH DOSE SEASONAL PF 04/19/2019, 04/18/2020, 06/17/2021   Influenza-Unspecified 04/19/2019, 04/18/2020, 06/17/2021   Moderna Covid-19 Vaccine Bivalent Booster 51yrs & up 08/16/2019, 09/12/2019, 06/29/2021   Moderna Sars-Covid-2 Vaccination 06/17/2020, 03/09/2021, 08/06/2021, 12/17/2021   Novel Infuenza-h1n1-09 07/09/2008   Pneumococcal Conjugate-13 12/20/2013   Pneumococcal Polysaccharide-23 08/17/2012   Tdap 08/16/2022   Tetanus 08/16/2022   Zoster Recombinant(Shingrix) 01/30/2014, 12/18/2020, 04/18/2021   Pertinent  Health Maintenance Due  Topic Date Due   INFLUENZA VACCINE  05/01/2024 (Originally 03/02/2024)   DEXA SCAN  Completed      01/31/2023    1:33 PM 02/02/2023   11:44 AM 03/22/2023    8:19 AM 05/31/2023   11:04 AM 09/13/2023    2:46 PM  Fall Risk  Falls in the past year? 1 1 0 0 0  Was there an injury with Fall? 1 0 0 0 0  Fall Risk Category Calculator 2 2 0 0 0  Patient at Risk for Falls Due to History of fall(s);Impaired balance/gait;Impaired mobility History of fall(s);Impaired balance/gait;Impaired mobility History of fall(s);Impaired balance/gait;Impaired mobility No Fall Risks No Fall Risks  Fall risk Follow up Falls evaluation completed Falls evaluation completed;Education provided;Falls  prevention discussed Falls evaluation completed Falls evaluation completed Falls evaluation completed   Functional Status Survey:    Vitals:   04/03/24 1604  BP: 131/78  Pulse: 79  Resp: 17  Temp: (!) 97.3 F (36.3 C)  SpO2:  98%  Weight: 168 lb (76.2 kg)  Height: 5' 2 (1.575 m)   Body mass index is 30.73 kg/m. Physical Exam  Labs reviewed: Recent Labs    08/18/23 0000 04/01/24 2250  NA 134* 127*  K 4.2 4.1  CL 97* 92*  CO2 27* 23  GLUCOSE  --  112*  BUN 17 16  CREATININE 0.9 1.08*  CALCIUM  8.8 9.3   Recent Labs    08/18/23 0000 04/01/24 2250  AST 15 20  ALT 14 20  ALKPHOS 109 79  BILITOT  --  0.9  PROT  --  6.6  ALBUMIN 3.9 4.1   Recent Labs    05/24/23 0000 08/18/23 0000 04/01/24 2250  WBC 6.3 6.2 6.3  NEUTROABS  --   --  4.4  HGB 12.5 12.3 13.7  HCT 36 35* 39.3  MCV  --   --  92.9  PLT 311 295 241   Lab Results  Component Value Date   TSH 0.59 12/08/2023   No results found for: HGBA1C Lab Results  Component Value Date   CHOL 196 12/08/2023   HDL 58 12/08/2023   LDLCALC 99 12/08/2023   TRIG 198 (A) 12/08/2023    Significant Diagnostic Results in last 30 days:  No results found.  Assessment/Plan There are no diagnoses linked to this encounter.   Family/ staff Communication: ***  Labs/tests ordered:  ***

## 2024-04-03 NOTE — Telephone Encounter (Signed)
 Copied from CRM [id number]. Topic: Clinical - Medication Question >> Apr 03, 2024  9:01 AM Carmell SAUNDERS wrote: Reason for CRM: Rosina with [facility] for Dr. Adine Haddock. Looking to speak with Dr. Charlanne or any of her colleagues about this patient's medication, valACYclovir  (VALTREX ) 1000 MG tablet. Doctor is asking with the patient having Stage 3 Chronic Kidney disease, is this a safe and acceptable dose? Directions show: Take 1 tablet (1,000 mg total) by mouth 3 (three) times daily.  Please follow up at [phone]

## 2024-04-04 MED ORDER — VALACYCLOVIR HCL 1 G PO TABS: 1000.0000 mg | ORAL_TABLET | Freq: Two times a day (BID) | ORAL | Status: AC

## 2024-04-04 NOTE — Telephone Encounter (Signed)
 Darlean Maus, NP to Me  [facility]  Gil Greig BRAVO, NP (Selected Message)     04/03/24  4:30 PM I would recommend 1000 mg q 12 hrs as her creatinine clearance is 45ml.     Thanks   Sophia Nash Darlean NP  Spoke with representative from Dr. Waylan that they have already got the a message from our office.  Message sent to Darlean Maus, NP

## 2024-04-04 NOTE — Telephone Encounter (Signed)
 Message left with Dr. Junella nurse regarding valcyclovir dose. I have given them my number for call back.

## 2024-04-10 DIAGNOSIS — B0239 Other herpes zoster eye disease: Secondary | ICD-10-CM | POA: Diagnosis not present

## 2024-04-10 DIAGNOSIS — Z961 Presence of intraocular lens: Secondary | ICD-10-CM | POA: Diagnosis not present

## 2024-04-10 DIAGNOSIS — R350 Frequency of micturition: Secondary | ICD-10-CM | POA: Diagnosis not present

## 2024-04-10 LAB — BASIC METABOLIC PANEL WITH GFR
BUN: 16 (ref 4–21)
CO2: 24 — AB (ref 13–22)
Chloride: 96 — AB (ref 99–108)
Creatinine: 0.9 (ref 0.5–1.1)
Glucose: 86
Potassium: 4.3 meq/L (ref 3.5–5.1)
Sodium: 133 — AB (ref 137–147)

## 2024-04-10 LAB — COMPREHENSIVE METABOLIC PANEL WITH GFR
Albumin: 3.9 (ref 3.5–5.0)
Calcium: 9.3 (ref 8.7–10.7)
eGFR: 67

## 2024-04-17 ENCOUNTER — Non-Acute Institutional Stay: Admitting: Internal Medicine

## 2024-04-17 ENCOUNTER — Encounter: Admitting: Internal Medicine

## 2024-04-17 ENCOUNTER — Encounter: Payer: Self-pay | Admitting: Internal Medicine

## 2024-04-17 VITALS — BP 138/80 | HR 88 | Temp 98.5°F | Ht 62.0 in | Wt 168.0 lb

## 2024-04-17 DIAGNOSIS — E7800 Pure hypercholesterolemia, unspecified: Secondary | ICD-10-CM

## 2024-04-17 DIAGNOSIS — E039 Hypothyroidism, unspecified: Secondary | ICD-10-CM

## 2024-04-17 DIAGNOSIS — I10 Essential (primary) hypertension: Secondary | ICD-10-CM

## 2024-04-17 DIAGNOSIS — F339 Major depressive disorder, recurrent, unspecified: Secondary | ICD-10-CM

## 2024-04-17 DIAGNOSIS — K219 Gastro-esophageal reflux disease without esophagitis: Secondary | ICD-10-CM

## 2024-04-17 DIAGNOSIS — S32010D Wedge compression fracture of first lumbar vertebra, subsequent encounter for fracture with routine healing: Secondary | ICD-10-CM | POA: Diagnosis not present

## 2024-04-17 DIAGNOSIS — B0231 Zoster conjunctivitis: Secondary | ICD-10-CM

## 2024-04-17 MED ORDER — CLONAZEPAM 0.5 MG PO TABS: 0.5000 mg | ORAL_TABLET | Freq: Two times a day (BID) | ORAL | 1 refills | Status: AC | PRN

## 2024-04-17 MED ORDER — OMEPRAZOLE 20 MG PO CPDR: 20.0000 mg | DELAYED_RELEASE_CAPSULE | Freq: Two times a day (BID) | ORAL | 1 refills | Status: DC

## 2024-04-17 MED ORDER — CELECOXIB 100 MG PO CAPS: 100.0000 mg | ORAL_CAPSULE | Freq: Two times a day (BID) | ORAL | 3 refills | Status: DC

## 2024-04-17 NOTE — Progress Notes (Unsigned)
 Location:  [facility] features editor of Service:  Clinic (12)  Provider:   Code Status: *** Goals of Care:     04/01/2024   10:41 PM  Advanced Directives  Does Patient Have a Medical Advance Directive? No     Chief Complaint  Patient presents with   Follow-up    4 month follow up.  Patient would like to talk about stomach issues having when eating different foods. Patient has concerns about nasal drip.    HPI: Patient is a 89 y.o. female seen today for medical management of chronic diseases.     Past Medical History:  Diagnosis Date   Allergy    Anemia    Anginal pain (HCC)    Anxiety    Arthritis    Atypical moles    Breast lump    left   Chronic kidney disease    Colon polyp    Depression    Diverticulitis    04-15-15 no recent issues   Dysrhythmia 01/2020   Edema    legs, hands  bilateral   GERD (gastroesophageal reflux disease)    hx of    Heart murmur 01/2020   High cholesterol    Per new patient form   Hypertension    Hypothyroidism    IBS (irritable bowel syndrome)    Per new patient form   Impaired hearing    bilaterally some hearing loss-no hearing aids   Lactose intolerance    Per new patient form   Macular degeneration    Per new patient form   Mild cognitive impairment with memory loss 08/02/2000   per Dr Leigh, neuro, 04-15-15 no changes-stable   Recurrent upper respiratory infection (URI)    cold with sinus drainage x several months- saw PCP 10/08/11- chest x ray report on chart.  no fever,resolved, no recent issues as of 04-15-15.   Shoulder joint pain    04-07-15 tripped-loss balance, fell awkwardly and wrenched shoulder-left, Known bad right shoulderbone on bone   Skin lesion of breast    hx of right lateral breast  lesion-tx. with multipe rounds oral antibiotic- resolved, no reoccurrence.   Sleep apnea    Thyroid  disease    hypothyroidism    Past Surgical History:  Procedure Laterality Date   BREAST SURGERY      lumpectomy- left-benign   CATARACT EXTRACTION, BILATERAL Bilateral    CHOLECYSTECTOMY     laparoscopic   COLECTOMY  10/20/2011   sigmoid colectomy   COLONOSCOPY  2018   DG  BONE DENSITY ([facility] HX)  2021   Damien Gander, PA; [facility]/ Per new patient form   ESOPHAGOGASTRODUODENOSCOPY     EYE SURGERY     bilateral cataracts   HERNIA REPAIR     INCISIONAL HERNIA REPAIR N/A 01/12/2013   Procedure: LAPAROSCOPIC/OPEN REPAIR INCISIONAL HERNIA;  Surgeon: Morene ONEIDA Olives, MD;  Location: [facility];  Service: General;  Laterality: N/A;  With Mesh   INCISIONAL HERNIA REPAIR N/A 04/23/2015   Procedure: LAPAROSCOPIC ASSISTED INCISIONAL VENTRAL HERNIA REPAIR;  Surgeon: Morene Olives, MD;  Location: [facility];  Service: General;  Laterality: N/A;   INSERTION OF MESH N/A 04/23/2015   Procedure: INSERTION OF MESH;  Surgeon: Morene Olives, MD;  Location: [facility];  Service: General;  Laterality: N/A;   JOINT REPLACEMENT  05/2021   left hip   KYPHOPLASTY N/A 08/19/2021   Procedure: KYPHOPLASTY THORACIC TWELVE, LUMBAR ONE;  Surgeon: Mavis Purchase, MD;  Location: [facility] OR;  Service:  Neurosurgery;  Laterality: N/A;  KYPHOPLASTY THORACIC TWELVE, LUMBAR ONE   TOTAL HIP ARTHROPLASTY Left 2022   [facility]; Per new patient form   TUBAL LIGATION  1972    Allergies  Allergen Reactions   Penicillins Anaphylaxis   Codeine     Makes pt overly excited.... Anxious/ hyperactive    Nsaids     Burns stomach    Clindamycin /Lincomycin Rash    Outpatient Encounter Medications as of 04/17/2024  Medication Sig   acetaminophen  (TYLENOL ) 650 MG CR tablet Take 650 mg by mouth 2 (two) times daily as needed for pain. 1,300mg  BID, oral, Twice a day - PRN, BiD PRN mild pain, may self administer   albuterol  (VENTOLIN  HFA) 108 (90 Base) MCG/ACT inhaler Inhale 2 puffs into the lungs every 6 (six) hours as needed for wheezing or shortness of breath.   AMLODIPINE  BESYLATE PO Take 2.5 mg by mouth  every morning. And 2 tablets (5 mg ) in the evening   Calcium  Carb-Cholecalciferol (CALCIUM  600 + D PO) Take 1 tablet by mouth daily.   Carboxymethylcellulose Sodium (LUBRICANT EYE DROPS OP) Apply 1 drop to eye as needed.   Cobalamin Combinations (COBALAMINE COMBINATIONS PO) Take 3,000 mcg by mouth daily. In the afternoon for polyarthritis   cyanocobalamin 100 MCG tablet Take 100 mcg by mouth daily. 3000 mcg, oral   DULoxetine  (CYMBALTA ) 60 MG capsule TAKE 1 CAPSULE BY MOUTH EVERY DAY   hyoscyamine  (LEVSIN  SL) 0.125 MG SL tablet Place 0.125 mg under the tongue every 4 (four) hours as needed for cramping.   levothyroxine  (SYNTHROID ) 50 MCG tablet Take 1 tablet (50 mcg total) by mouth daily before breakfast.   lidocaine  4 % Place 1 patch onto the skin every 12 (twelve) hours as needed.   Menthol, Topical Analgesic, (BIOFREEZE PROFESSIONAL) 5 % GEL Apply topically. Small amount, topical may self administer roll on or ask for assister roll on or ask for assistance from staff. Roll on to right hip or back   Multiple Vitamins-Minerals (PRESERVISION AREDS 2+MULTI VIT PO) Take 1 capsule by mouth daily.   polyethylene glycol (MIRALAX / GLYCOLAX) 17 g packet Take 17 g by mouth daily as needed.   Probiotic Product (PROBIOTIC PO) Take 1 capsule by mouth every morning.   rosuvastatin  (CRESTOR ) 5 MG tablet Take 1 tablet (5 mg total) by mouth daily. E78.00   tiZANidine  (ZANAFLEX ) 4 MG tablet TAKE 0.5 TABLETS (2 MG TOTAL) BY MOUTH 2 (TWO) TIMES DAILY AS NEEDED FOR MUSCLE SPASMS.   traMADol  (ULTRAM ) 50 MG tablet Take 1-2 tablets (50-100 mg total) by mouth every 12 (twelve) hours as needed.   triamcinolone  cream (KENALOG) 0.1 % Apply 1 Application topically every 12 (twelve) hours as needed.   triamterene -hydrochlorothiazide  (DYAZIDE ) 37.5-25 MG capsule TAKE 1 CAPSULE BY MOUTH EVERY MORNING   [DISCONTINUED] celecoxib  (CELEBREX ) 100 MG capsule Take 1 capsule (100 mg total) by mouth in the morning and at bedtime.    [DISCONTINUED] Chlorpheniramine-DM (CORICIDIN COUGH/COLD) 4-30 MG TABS Take 1 tablet by mouth every 12 (twelve) hours as needed.   [DISCONTINUED] clonazePAM  (KLONOPIN ) 0.5 MG tablet Take 1 tablet (0.5 mg total) by mouth 2 (two) times daily as needed for anxiety.   [DISCONTINUED] loratadine  (CLARITIN ) 10 MG tablet Take 10 mg by mouth daily. Hold x 14 days   [DISCONTINUED] omeprazole  (PRILOSEC) 20 MG capsule TAKE 1 CAPSULE BY MOUTH EVERY DAY   [DISCONTINUED] Pseudoephedrine-Guaifenesin (MUCINEX D MAX STRENGTH) [phone] MG TB12 Take 1 tablet by mouth  every 12 (twelve) hours as needed.   celecoxib  (CELEBREX ) 100 MG capsule Take 1 capsule (100 mg total) by mouth in the morning and at bedtime.   clonazePAM  (KLONOPIN ) 0.5 MG tablet Take 1 tablet (0.5 mg total) by mouth 2 (two) times daily as needed for anxiety.   lidocaine -prilocaine  (EMLA ) cream Apply to affected area(s) up to four times daily. (Patient not taking: Reported on 04/17/2024)   omeprazole  (PRILOSEC) 20 MG capsule Take 1 capsule (20 mg total) by mouth 2 (two) times daily before a meal.   UNABLE TO FIND Med Name:   CBD gummy, administer 750 mg; 1 gummy oral Special Instructions: 1 gummy qhs/prn for insomnia, may self-administer at bedtime. every 24 hours as needed for insomnia May self-administer (Patient not taking: Reported on 04/17/2024)   [DISCONTINUED] clindamycin  (CLEOCIN ) 300 MG capsule Take 300 mg by mouth as needed. 300mg , oral, PRN Before any dental appointment, may self administer (Patient not taking: Reported on 04/17/2024)   [DISCONTINUED] doxycycline (VIBRA-TABS) 100 MG tablet Take 100 mg by mouth 2 (two) times daily. X 5 days (Patient not taking: Reported on 04/17/2024)   [DISCONTINUED] erythromycin  ophthalmic ointment Place a 1/2 inch ribbon of ointment into the lower eyelid Q 6 hours.   [DISCONTINUED] lidocaine  (XYLOCAINE ) 2 % solution Use as directed 5 mLs in the mouth or throat 4 (four) times daily as needed. (Patient not taking:  Reported on 04/17/2024)   No facility-administered encounter medications on file as of 04/17/2024.    Review of Systems:  Review of Systems  Health Maintenance  Topic Date Due   Influenza Vaccine  05/01/2024 (Originally 03/02/2024)   COVID-19 Vaccine (9 - 2024-25 season) 06/07/2024 (Originally 04/02/2024)   Medicare Annual Wellness (AWV)  07/02/2024 (Originally 01/31/2024)   DTaP/Tdap/Td (2 - Td or Tdap) 08/16/2032   Pneumococcal Vaccine: 50+ Years  Completed   DEXA SCAN  Completed   Zoster Vaccines- Shingrix  Completed   HPV VACCINES  Aged Out   Meningococcal B Vaccine  Aged Out    Physical Exam: Vitals:   04/17/24 1155  BP: 138/80  Pulse: 88  Temp: 98.5 F (36.9 C)  SpO2: 98%  Weight: 168 lb (76.2 kg)  Height: 5' 2 (1.575 m)   Body mass index is 30.73 kg/m. Physical Exam  Labs reviewed: Basic Metabolic Panel: Recent Labs    08/18/23 0000 12/08/23 0000 04/01/24 2250 04/10/24 0000  NA 134*  --  127* 133*  K 4.2  --  4.1 4.3  CL 97*  --  92* 96*  CO2 27*  --  23 24*  GLUCOSE  --   --  112*  --   BUN 17  --  16 16  CREATININE 0.9  --  1.08* 0.9  CALCIUM  8.8  --  9.3 9.3  TSH  --  0.59  --   --    Liver Function Tests: Recent Labs    08/18/23 0000 04/01/24 2250 04/10/24 0000  AST 15 20  --   ALT 14 20  --   ALKPHOS 109 79  --   BILITOT  --  0.9  --   PROT  --  6.6  --   ALBUMIN 3.9 4.1 3.9   No results for input(s): LIPASE, AMYLASE in the last 8760 hours. No results for input(s): AMMONIA in the last 8760 hours. CBC: Recent Labs    05/24/23 0000 08/18/23 0000 04/01/24 2250  WBC 6.3 6.2 6.3  NEUTROABS  --   --  4.4  HGB 12.5 12.3 13.7  HCT 36 35* 39.3  MCV  --   --  92.9  PLT 311 295 241   Lipid Panel: Recent Labs    08/18/23 0000 12/08/23 0000  CHOL 179 196  HDL 57 58  LDLCALC 92 99  TRIG 152 198*   No results found for: HGBA1C  Procedures since last visit: No results found.  Assessment/Plan 1. Compression fracture of L1  vertebra with routine healing, subsequent encounter *** - celecoxib  (CELEBREX ) 100 MG capsule; Take 1 capsule (100 mg total) by mouth in the morning and at bedtime.  Dispense: 180 capsule; Refill: 3  2. Gastro-esophageal reflux disease without esophagitis *** - omeprazole  (PRILOSEC) 20 MG capsule; Take 1 capsule (20 mg total) by mouth 2 (two) times daily before a meal.  Dispense: 180 capsule; Refill: 1    Labs/tests ordered:  Lba ordered Next appt:  08/06/2024

## 2024-04-18 ENCOUNTER — Other Ambulatory Visit: Payer: Self-pay | Admitting: Orthopedic Surgery

## 2024-04-18 DIAGNOSIS — B0229 Other postherpetic nervous system involvement: Secondary | ICD-10-CM

## 2024-04-18 MED ORDER — GABAPENTIN 100 MG PO CAPS: 100.0000 mg | ORAL_CAPSULE | Freq: Three times a day (TID) | ORAL | 0 refills | Status: DC | PRN

## 2024-04-23 ENCOUNTER — Other Ambulatory Visit: Payer: Self-pay | Admitting: Adult Health

## 2024-04-23 DIAGNOSIS — B0229 Other postherpetic nervous system involvement: Secondary | ICD-10-CM

## 2024-04-23 MED ORDER — GABAPENTIN 300 MG PO CAPS
300.0000 mg | ORAL_CAPSULE | Freq: Three times a day (TID) | ORAL | 2 refills | Status: DC | PRN
Start: 2024-04-23 — End: 2024-05-03

## 2024-04-23 NOTE — Progress Notes (Signed)
 Nurse reports pain is not resolved with neurontin  100 mg. She has been taking 2 caps with no relief. Will increase to 300 mg tid prn.

## 2024-05-03 ENCOUNTER — Encounter: Payer: Self-pay | Admitting: Adult Health

## 2024-05-03 ENCOUNTER — Non-Acute Institutional Stay: Payer: Self-pay | Admitting: Adult Health

## 2024-05-03 DIAGNOSIS — B0229 Other postherpetic nervous system involvement: Secondary | ICD-10-CM | POA: Diagnosis not present

## 2024-05-03 DIAGNOSIS — K219 Gastro-esophageal reflux disease without esophagitis: Secondary | ICD-10-CM | POA: Diagnosis not present

## 2024-05-03 DIAGNOSIS — S32010D Wedge compression fracture of first lumbar vertebra, subsequent encounter for fracture with routine healing: Secondary | ICD-10-CM | POA: Diagnosis not present

## 2024-05-03 MED ORDER — CELECOXIB 100 MG PO CAPS
100.0000 mg | ORAL_CAPSULE | Freq: Two times a day (BID) | ORAL | Status: AC | PRN
Start: 2024-05-03 — End: ?

## 2024-05-03 MED ORDER — GABAPENTIN 300 MG PO CAPS: 300.0000 mg | ORAL_CAPSULE | Freq: Four times a day (QID) | ORAL | 2 refills | Status: DC

## 2024-05-03 MED ORDER — TRAMADOL HCL 50 MG PO TABS: 50.0000 mg | ORAL_TABLET | Freq: Two times a day (BID) | ORAL | Status: AC | PRN

## 2024-05-03 MED ORDER — LORATADINE 10 MG PO TABS: 10.0000 mg | ORAL_TABLET | Freq: Every day | ORAL | Status: AC

## 2024-05-03 MED ORDER — OMEPRAZOLE 20 MG PO CPDR
20.0000 mg | DELAYED_RELEASE_CAPSULE | Freq: Every day | ORAL | Status: AC
Start: 2024-05-03 — End: ?

## 2024-05-03 NOTE — Progress Notes (Signed)
 Location:  Medical illustrator of Service:  ALF (13) Provider:   Bari America, ANP [facility] [phone]   Charlanne Fredia CROME, MD  Patient Care Team: Charlanne Fredia CROME, MD as PCP - General (Internal Medicine) Charlanne Fredia CROME, MD as Consulting Physician (Internal Medicine) Bertrand Gowda, MD as Referring Physician (Obstetrics and Gynecology) Kate Lonni CROME, MD as Consulting Physician (Cardiology)  Extended Emergency Contact Information Primary Emergency Contact: East,Teresa Address: [address]          [place], [place] [address] [place] Home Phone: [phone] Mobile Phone: [phone] Relation: Daughter Secondary Emergency Contact: Donzetta Brinda Slough Address: [address]          [place], [place] [address] [place] Home Phone: [phone] Mobile Phone: [phone] Relation: Daughter  Code Status:  Full Goals of care: Advanced Directive information    04/01/2024   10:41 PM  Advanced Directives  Does Patient Have a Medical Advance Directive? No     Chief Complaint  Patient presents with   Acute Visit    Pain after shingles    HPI:   The patient presents with postherpetic neuralgia following a recent shingles outbreak.  Diagnosed in the ED 04/01/24 and started on valtrex   Neuropathic pain (postherpetic neuralgia) - Pain localized from the forehead to the hairline - Peak pain intensity occurs in the evening - Gabapentin  300 mg taken three times daily, with an additional dose from noon until nighttime, resulting in slight improvement over the past few days - Tramadol  reserved for severe pain due to concerns about side effects and limited supply - 2% lidocaine  cream ordered not using regularly   Ophthalmic symptoms - Recent shingles outbreak affected the left eye, requiring emergency room evaluation and ophthalmology consultation - Currently no ocular symptoms; eye is doing well -has  seen ophthalmology  - Reads daily to maintain eye health  Oral intake and swallowing - Eating in their room for five weeks due to concerns about shingles transmission, with plans to return to the dining room soon - No difficulty with chewing or swallowing  Auditory and peripheral symptoms - No changes in hearing - No ankle swelling  Varicella-zoster virus exposure history - Uncertain history of childhood chickenpox  Past Medical History:  Diagnosis Date   Allergy    Anemia    Anginal pain    Anxiety    Arthritis    Atypical moles    Breast lump    left   Chronic kidney disease    Colon polyp    Depression    Diverticulitis    04-15-15 no recent issues   Dysrhythmia 01/2020   Edema    legs, hands  bilateral   GERD (gastroesophageal reflux disease)    hx of    Heart murmur 01/2020   High cholesterol    Per new patient form   Hypertension    Hypothyroidism    IBS (irritable bowel syndrome)    Per new patient form   Impaired hearing    bilaterally some hearing loss-no hearing aids   Lactose intolerance    Per new patient form   Macular degeneration    Per new patient form   Mild cognitive impairment with memory loss 08/02/2000   per Dr Leigh, neuro, 04-15-15 no changes-stable   Recurrent upper respiratory infection (URI)    cold with sinus drainage x several months- saw PCP 10/08/11- chest x ray report on chart.  no fever,resolved,  no recent issues as of 04-15-15.   Shoulder joint pain    04-07-15 tripped-loss balance, fell awkwardly and wrenched shoulder-left, Known bad right shoulderbone on bone   Skin lesion of breast    hx of right lateral breast  lesion-tx. with multipe rounds oral antibiotic- resolved, no reoccurrence.   Sleep apnea    Thyroid  disease    hypothyroidism   Past Surgical History:  Procedure Laterality Date   BREAST SURGERY     lumpectomy- left-benign   CATARACT EXTRACTION, BILATERAL Bilateral    CHOLECYSTECTOMY     laparoscopic    COLECTOMY  10/20/2011   sigmoid colectomy   COLONOSCOPY  2018   DG  BONE DENSITY ([facility] HX)  2021   Damien Gander, PA; [facility]/ Per new patient form   ESOPHAGOGASTRODUODENOSCOPY     EYE SURGERY     bilateral cataracts   HERNIA REPAIR     INCISIONAL HERNIA REPAIR N/A 01/12/2013   Procedure: LAPAROSCOPIC/OPEN REPAIR INCISIONAL HERNIA;  Surgeon: Morene ONEIDA Olives, MD;  Location: [facility];  Service: General;  Laterality: N/A;  With Mesh   INCISIONAL HERNIA REPAIR N/A 04/23/2015   Procedure: LAPAROSCOPIC ASSISTED INCISIONAL VENTRAL HERNIA REPAIR;  Surgeon: Morene Olives, MD;  Location: [facility];  Service: General;  Laterality: N/A;   INSERTION OF MESH N/A 04/23/2015   Procedure: INSERTION OF MESH;  Surgeon: Morene Olives, MD;  Location: [facility];  Service: General;  Laterality: N/A;   JOINT REPLACEMENT  05/2021   left hip   KYPHOPLASTY N/A 08/19/2021   Procedure: KYPHOPLASTY THORACIC TWELVE, LUMBAR ONE;  Surgeon: Mavis Purchase, MD;  Location: [facility];  Service: Neurosurgery;  Laterality: N/A;  KYPHOPLASTY THORACIC TWELVE, LUMBAR ONE   TOTAL HIP ARTHROPLASTY Left 2022   [facility]; Per new patient form   TUBAL LIGATION  1972    Allergies  Allergen Reactions   Penicillins Anaphylaxis   Codeine     Makes pt overly excited.... Anxious/ hyperactive    Nsaids     Burns stomach    Clindamycin /Lincomycin Rash    Outpatient Encounter Medications as of 05/03/2024  Medication Sig   gabapentin  (NEURONTIN ) 300 MG capsule Take 1 capsule (300 mg total) by mouth 4 (four) times daily. And 1 additional capsule for as needed for pain each day   lidocaine -prilocaine  (EMLA ) cream Apply to affected area(s) up to four times daily.   loratadine  (CLARITIN ) 10 MG tablet Take 1 tablet (10 mg total) by mouth daily.   acetaminophen  (TYLENOL ) 650 MG CR tablet Take 650 mg by mouth 2 (two) times daily as needed for pain. 1,300mg  BID, oral, Twice a day - PRN, BiD PRN mild pain, may  self administer   albuterol  (VENTOLIN  HFA) 108 (90 Base) MCG/ACT inhaler Inhale 2 puffs into the lungs every 6 (six) hours as needed for wheezing or shortness of breath.   AMLODIPINE  BESYLATE PO Take 2.5 mg by mouth every morning. And 2 tablets (5 mg ) in the evening   Calcium  Carb-Cholecalciferol (CALCIUM  600 + D PO) Take 1 tablet by mouth daily.   Carboxymethylcellulose Sodium (LUBRICANT EYE DROPS OP) Apply 1 drop to eye as needed.   celecoxib  (CELEBREX ) 100 MG capsule Take 1 capsule (100 mg total) by mouth 2 (two) times daily as needed.   clonazePAM  (KLONOPIN ) 0.5 MG tablet Take 1 tablet (0.5 mg total) by mouth 2 (two) times daily as needed for anxiety.   Cobalamin Combinations (COBALAMINE COMBINATIONS PO) Take 3,000 mcg by mouth daily. In  the afternoon for polyarthritis   cyanocobalamin 100 MCG tablet Take 100 mcg by mouth daily. 3000 mcg, oral   DULoxetine  (CYMBALTA ) 60 MG capsule TAKE 1 CAPSULE BY MOUTH EVERY DAY   hyoscyamine  (LEVSIN  SL) 0.125 MG SL tablet Place 0.125 mg under the tongue every 4 (four) hours as needed for cramping.   levothyroxine  (SYNTHROID ) 50 MCG tablet Take 1 tablet (50 mcg total) by mouth daily before breakfast.   lidocaine  4 % Place 1 patch onto the skin every 12 (twelve) hours as needed.   Menthol, Topical Analgesic, (BIOFREEZE PROFESSIONAL) 5 % GEL Apply topically. Small amount, topical may self administer roll on or ask for assister roll on or ask for assistance from staff. Roll on to right hip or back   Multiple Vitamins-Minerals (PRESERVISION AREDS 2+MULTI VIT PO) Take 1 capsule by mouth daily.   omeprazole  (PRILOSEC) 20 MG capsule Take 1 capsule (20 mg total) by mouth daily.   polyethylene glycol (MIRALAX / GLYCOLAX) 17 g packet Take 17 g by mouth daily as needed.   Probiotic Product (PROBIOTIC PO) Take 1 capsule by mouth every morning.   rosuvastatin  (CRESTOR ) 5 MG tablet Take 1 tablet (5 mg total) by mouth daily. E78.00   tiZANidine  (ZANAFLEX ) 4 MG tablet TAKE  0.5 TABLETS (2 MG TOTAL) BY MOUTH 2 (TWO) TIMES DAILY AS NEEDED FOR MUSCLE SPASMS.   traMADol  (ULTRAM ) 50 MG tablet Take 1 tablet (50 mg total) by mouth every 12 (twelve) hours as needed.   triamcinolone  cream (KENALOG) 0.1 % Apply 1 Application topically every 12 (twelve) hours as needed.   triamterene -hydrochlorothiazide  (DYAZIDE ) 37.5-25 MG capsule TAKE 1 CAPSULE BY MOUTH EVERY MORNING   [DISCONTINUED] celecoxib  (CELEBREX ) 100 MG capsule Take 1 capsule (100 mg total) by mouth in the morning and at bedtime.   [DISCONTINUED] gabapentin  (NEURONTIN ) 300 MG capsule Take 1 capsule (300 mg total) by mouth 3 (three) times daily as needed.   [DISCONTINUED] omeprazole  (PRILOSEC) 20 MG capsule Take 1 capsule (20 mg total) by mouth 2 (two) times daily before a meal.   [DISCONTINUED] traMADol  (ULTRAM ) 50 MG tablet Take 1-2 tablets (50-100 mg total) by mouth every 12 (twelve) hours as needed.   [DISCONTINUED] UNABLE TO FIND Med Name:   CBD gummy, administer 750 mg; 1 gummy oral Special Instructions: 1 gummy qhs/prn for insomnia, may self-administer at bedtime. every 24 hours as needed for insomnia May self-administer (Patient not taking: Reported on 04/17/2024)   No facility-administered encounter medications on file as of 05/03/2024.    Review of Systems  Constitutional:  Negative for activity change, appetite change, chills, diaphoresis, fatigue, fever and unexpected weight change.  HENT:  Negative for trouble swallowing.        No change in hearing or difficulty with eating  Eyes:  Negative for photophobia, pain, discharge, redness, itching and visual disturbance.  Musculoskeletal:        Pain to left side of face  Skin:  Positive for rash.    Immunization History  Administered Date(s) Administered   Fluad Quad(high Dose 65+) 05/07/2022   INFLUENZA, HIGH DOSE SEASONAL PF 04/19/2019, 04/18/2020, 06/17/2021   Influenza-Unspecified 04/19/2019, 04/18/2020, 06/17/2021, 05/24/2023   Moderna Covid-19  Vaccine Bivalent Booster 53yrs & up 08/16/2019, 09/12/2019, 06/29/2021   Moderna Sars-Covid-2 Vaccination 06/17/2020, 03/09/2021, 08/06/2021, 12/17/2021   Novel Infuenza-h1n1-09 07/09/2008   Pneumococcal Conjugate-13 12/20/2013   Pneumococcal Polysaccharide-23 08/17/2012   Tdap 08/16/2022   Tetanus 08/16/2022   Unspecified SARS-COV-2 Vaccination 05/24/2023   Zoster Recombinant(Shingrix) 01/30/2014, 12/18/2020,  04/18/2021   Pertinent  Health Maintenance Due  Topic Date Due   Influenza Vaccine  03/02/2024   DEXA SCAN  Completed      02/02/2023   11:44 AM 03/22/2023    8:19 AM 05/31/2023   11:04 AM 09/13/2023    2:46 PM 04/04/2024   11:00 AM  Fall Risk  Falls in the past year? 1 0 0 0 0  Was there an injury with Fall? 0 0 0 0 0  Fall Risk Category Calculator 2 0 0 0 0  Patient at Risk for Falls Due to History of fall(s);Impaired balance/gait;Impaired mobility History of fall(s);Impaired balance/gait;Impaired mobility No Fall Risks No Fall Risks History of fall(s)  Fall risk Follow up Falls evaluation completed;Education provided;Falls prevention discussed Falls evaluation completed Falls evaluation completed Falls evaluation completed Falls evaluation completed   Functional Status Survey:    Vitals:   05/03/24 1605  BP: 118/86  Pulse: 80  Resp: 20  Temp: (!) 97 F (36.1 C)  SpO2: 93%   There is no height or weight on file to calculate BMI. Physical Exam Vitals reviewed.  Constitutional:      Appearance: Normal appearance.  HENT:     Head: Normocephalic and atraumatic.     Right Ear: Tympanic membrane normal.     Left Ear: Tympanic membrane normal.     Mouth/Throat:     Mouth: Mucous membranes are moist.     Pharynx: Oropharynx is clear. No oropharyngeal exudate.  Eyes:     General:        Right eye: No discharge.        Left eye: No discharge.     Extraocular Movements: Extraocular movements intact.     Conjunctiva/sclera: Conjunctivae normal.     Pupils: Pupils are  equal, round, and reactive to light.  Cardiovascular:     Rate and Rhythm: Normal rate and regular rhythm.  Pulmonary:     Effort: Pulmonary effort is normal. No respiratory distress.     Breath sounds: Normal breath sounds.  Skin:    General: Skin is warm and dry.     Comments: Mild residual maculopapular rash to the left side of her face, much improved no vesicles.   Neurological:     General: No focal deficit present.     Mental Status: She is alert and oriented to person, place, and time. Mental status is at baseline.  Psychiatric:        Mood and Affect: Mood normal.     Labs reviewed: Recent Labs    08/18/23 0000 04/01/24 2250 04/10/24 0000  NA 134* 127* 133*  K 4.2 4.1 4.3  CL 97* 92* 96*  CO2 27* 23 24*  GLUCOSE  --  112*  --   BUN 17 16 16   CREATININE 0.9 1.08* 0.9  CALCIUM  8.8 9.3 9.3   Recent Labs    08/18/23 0000 04/01/24 2250 04/10/24 0000  AST 15 20  --   ALT 14 20  --   ALKPHOS 109 79  --   BILITOT  --  0.9  --   PROT  --  6.6  --   ALBUMIN 3.9 4.1 3.9   Recent Labs    05/24/23 0000 08/18/23 0000 04/01/24 2250  WBC 6.3 6.2 6.3  NEUTROABS  --   --  4.4  HGB 12.5 12.3 13.7  HCT 36 35* 39.3  MCV  --   --  92.9  PLT 311 295 241   Lab  Results  Component Value Date   TSH 0.59 12/08/2023   No results found for: HGBA1C Lab Results  Component Value Date   CHOL 196 12/08/2023   HDL 58 12/08/2023   LDLCALC 99 12/08/2023   TRIG 198 (A) 12/08/2023    Significant Diagnostic Results in last 30 days:  No results found.  Assessment/Plan  Postherpetic neuralgia of the left face  Chronic postherpetic neuralgia persists post-shingles. Gabapentin , Celebrex , and lidocaine  are in use. Tramadol  reserved for severe pain. No significant side effects from gabapentin .  - Increase gabapentin  to 300 mg four times a day and once prn - Reserve tramadol  for severe pain episodes. - Use lidocaine  2% cream up to four times a day as needed. (Already  ordered) - Monitor for side effects such as dizziness or lethargy. - Advise on slow positional changes to prevent dizziness. - Send prescription to [facility] for future use.   Total time :  time greater than 50% of total time spent doing pt counseling and coordination of care

## 2024-06-07 ENCOUNTER — Ambulatory Visit: Admitting: Orthopaedic Surgery

## 2024-06-07 ENCOUNTER — Encounter: Payer: Self-pay | Admitting: Orthopaedic Surgery

## 2024-06-07 ENCOUNTER — Other Ambulatory Visit: Payer: Self-pay

## 2024-06-07 ENCOUNTER — Telehealth: Payer: Self-pay | Admitting: Orthopaedic Surgery

## 2024-06-07 DIAGNOSIS — M25561 Pain in right knee: Secondary | ICD-10-CM

## 2024-06-07 DIAGNOSIS — M1711 Unilateral primary osteoarthritis, right knee: Secondary | ICD-10-CM | POA: Diagnosis not present

## 2024-06-07 DIAGNOSIS — G8929 Other chronic pain: Secondary | ICD-10-CM | POA: Diagnosis not present

## 2024-06-07 MED ORDER — LIDOCAINE HCL 1 % IJ SOLN
3.0000 mL | INTRAMUSCULAR | Status: AC | PRN
  Administered 2024-06-07: 3 mL

## 2024-06-07 MED ORDER — METHYLPREDNISOLONE ACETATE 40 MG/ML IJ SUSP
40.0000 mg | INTRAMUSCULAR | Status: AC | PRN
  Administered 2024-06-07: 40 mg via INTRA_ARTICULAR

## 2024-06-07 NOTE — Telephone Encounter (Signed)
 Called and left a VM advising patient that next available gel injection would need to be after 08/29/2024. Will submit January, 2026.

## 2024-06-07 NOTE — Telephone Encounter (Signed)
 Pt had an appt with Sophia Nash and want to go ahead and submit to insurance for gel injection right knee. Please call pt at [phone].

## 2024-06-07 NOTE — Progress Notes (Signed)
 The patient is very well-known to me.  3 months ago we did aspirate her knee secondary arthritis and withdrew 60 cc of fluid from her knee and then placed hyaluronic acid with Durolane in her right knee.  She comes in today 3 months later needing an aspiration again but a steroid injection this time.  In 3 months from now we would then go back to hyaluronic acid.  This routine is work for her greatly.  She is 89 years old.  Last month she did deal with a bad bout of shingles around her head and.  That has improved.  On examination of her right knee today there is a moderate effusion.  I was able to aspirate 40 cc of fluid from her right knee and then placed a steroid in her right knee which she tolerated well.  Will see her back in 3 months for hyaluronic acid in her knee to treat the long term pain from osteoarthritis of the right knee.  This patient is diagnosed with osteoarthritis of the knee(s).    Radiographs show evidence of joint space narrowing, osteophytes, subchondral sclerosis and/or subchondral cysts.  This patient has knee pain which interferes with functional and activities of daily living.    This patient has experienced inadequate response, adverse effects and/or intolerance with conservative treatments such as acetaminophen , NSAIDS, topical creams, physical therapy or regular exercise, knee bracing and/or weight loss.   This patient has experienced inadequate response or has a contraindication to intra articular steroid injections for at least 3 months.   This patient is not scheduled to have a total knee replacement within 6 months of starting treatment with viscosupplementation.    Procedure Note  Patient: Sophia Nash             Date of Birth: 03-Nov-1934           MRN: [id number]             Visit Date: 06/07/2024  Procedures: Visit Diagnoses:  1. Chronic pain of right knee   2. Unilateral primary osteoarthritis, right knee     Large Joint Inj: R knee on  06/07/2024 1:41 PM Indications: diagnostic evaluation and pain Details: 22 G 1.5 in needle, superolateral approach  Arthrogram: No  Medications: 3 mL lidocaine  1 %; 40 mg methylPREDNISolone  acetate 40 MG/ML Outcome: tolerated well, no immediate complications Procedure, treatment alternatives, risks and benefits explained, specific risks discussed. Consent was given by the patient. Immediately prior to procedure a time out was called to verify the correct patient, procedure, equipment, support staff and site/side marked as required. Patient was prepped and draped in the usual sterile fashion.

## 2024-06-11 ENCOUNTER — Other Ambulatory Visit: Payer: Self-pay | Admitting: Internal Medicine

## 2024-06-11 DIAGNOSIS — E7800 Pure hypercholesterolemia, unspecified: Secondary | ICD-10-CM

## 2024-06-18 DIAGNOSIS — H52223 Regular astigmatism, bilateral: Secondary | ICD-10-CM | POA: Diagnosis not present

## 2024-06-19 ENCOUNTER — Other Ambulatory Visit: Payer: Self-pay | Admitting: Orthopedic Surgery

## 2024-06-19 DIAGNOSIS — R300 Dysuria: Secondary | ICD-10-CM

## 2024-06-19 MED ORDER — PHENAZOPYRIDINE HCL 100 MG PO TABS: 100.0000 mg | ORAL_TABLET | Freq: Three times a day (TID) | ORAL | 0 refills | Status: DC | PRN

## 2024-06-19 MED ORDER — NITROFURANTOIN MONOHYD MACRO 100 MG PO CAPS: 100.0000 mg | ORAL_CAPSULE | Freq: Two times a day (BID) | ORAL | 0 refills | Status: AC

## 2024-06-19 MED ORDER — NITROFURANTOIN MONOHYD MACRO 100 MG PO CAPS: 100.0000 mg | ORAL_CAPSULE | Freq: Two times a day (BID) | ORAL | Status: DC

## 2024-06-19 NOTE — Progress Notes (Signed)
 Patient reports dysuria with low back pain. Afebrile. She is requesting to start antibiotic. Last UTI 2024. UA/culture collected. Will start Macrobid  due to allergies. UTA due to scheduling conflict.

## 2024-06-20 DIAGNOSIS — L814 Other melanin hyperpigmentation: Secondary | ICD-10-CM | POA: Diagnosis not present

## 2024-06-20 DIAGNOSIS — N390 Urinary tract infection, site not specified: Secondary | ICD-10-CM | POA: Diagnosis not present

## 2024-06-20 DIAGNOSIS — L821 Other seborrheic keratosis: Secondary | ICD-10-CM | POA: Diagnosis not present

## 2024-06-20 DIAGNOSIS — D1801 Hemangioma of skin and subcutaneous tissue: Secondary | ICD-10-CM | POA: Diagnosis not present

## 2024-06-20 DIAGNOSIS — L853 Xerosis cutis: Secondary | ICD-10-CM | POA: Diagnosis not present

## 2024-06-22 DIAGNOSIS — N390 Urinary tract infection, site not specified: Secondary | ICD-10-CM | POA: Diagnosis not present

## 2024-07-09 ENCOUNTER — Non-Acute Institutional Stay: Payer: Self-pay | Admitting: Adult Health

## 2024-07-09 ENCOUNTER — Encounter: Payer: Self-pay | Admitting: Adult Health

## 2024-07-09 DIAGNOSIS — B354 Tinea corporis: Secondary | ICD-10-CM | POA: Diagnosis not present

## 2024-07-09 NOTE — Progress Notes (Unsigned)
 Location:  [facility] Room Number: 533 P Place of Service:  ALF [phone]) Provider: Tawni America, NP   Patient Care Team: Charlanne Fredia CROME, MD as PCP - General (Internal Medicine) Charlanne Fredia CROME, MD as Consulting Physician (Internal Medicine) Bertrand Gowda, MD as Referring Physician (Obstetrics and Gynecology) Kate Lonni CROME, MD as Consulting Physician (Cardiology)  Extended Emergency Contact Information Primary Emergency Contact: East,Teresa Address: [address]          [place], [place] [address] [place] Home Phone: [phone] Mobile Phone: [phone] Relation: Daughter Secondary Emergency Contact: Donzetta Brinda Slough Address: [address]          [place], [place] [address] [place] Home Phone: [phone] Mobile Phone: [phone] Relation: Daughter  Code Status:  Full Code Goals of care: Advanced Directive information    04/01/2024   10:41 PM  Advanced Directives  Does Patient Have a Medical Advance Directive? No     Chief Complaint  Patient presents with   Acute Visit    HPI:  Pt is a 89 y.o. female seen today for an acute visit for a erythematous circular rash  The area is to the left upper back.  No drainage, pain, open areas, or itching She has tried nystatin powder.    Past Medical History:  Diagnosis Date   Allergy    Anemia    Anginal pain    Anxiety    Arthritis    Atypical moles    Breast lump    left   Chronic kidney disease    Colon polyp    Depression    Diverticulitis    04-15-15 no recent issues   Dysrhythmia 01/2020   Edema    legs, hands  bilateral   GERD (gastroesophageal reflux disease)    hx of    Heart murmur 01/2020   High cholesterol    Per new patient form   Hypertension    Hypothyroidism    IBS (irritable bowel syndrome)    Per new patient form   Impaired hearing    bilaterally some hearing loss-no hearing aids   Lactose intolerance     Per new patient form   Macular degeneration    Per new patient form   Mild cognitive impairment with memory loss 08/02/2000   per Dr Leigh, neuro, 04-15-15 no changes-stable   Recurrent upper respiratory infection (URI)    cold with sinus drainage x several months- saw PCP 10/08/11- chest x ray report on chart.  no fever,resolved, no recent issues as of 04-15-15.   Shoulder joint pain    04-07-15 tripped-loss balance, fell awkwardly and wrenched shoulder-left, Known bad right shoulderbone on bone   Skin lesion of breast    hx of right lateral breast  lesion-tx. with multipe rounds oral antibiotic- resolved, no reoccurrence.   Sleep apnea    Thyroid  disease    hypothyroidism   Past Surgical History:  Procedure Laterality Date   BREAST SURGERY     lumpectomy- left-benign   CATARACT EXTRACTION, BILATERAL Bilateral    CHOLECYSTECTOMY     laparoscopic   COLECTOMY  10/20/2011   sigmoid colectomy   COLONOSCOPY  2018   DG  BONE DENSITY ([facility] HX)  2021   Damien Gander, PA; [facility]/ Per new patient form   ESOPHAGOGASTRODUODENOSCOPY     EYE SURGERY     bilateral cataracts   HERNIA REPAIR     INCISIONAL HERNIA REPAIR N/A 01/12/2013  Procedure: LAPAROSCOPIC/OPEN REPAIR INCISIONAL HERNIA;  Surgeon: Morene ONEIDA Olives, MD;  Location: [facility];  Service: General;  Laterality: N/A;  With Mesh   INCISIONAL HERNIA REPAIR N/A 04/23/2015   Procedure: LAPAROSCOPIC ASSISTED INCISIONAL VENTRAL HERNIA REPAIR;  Surgeon: Morene Olives, MD;  Location: [facility];  Service: General;  Laterality: N/A;   INSERTION OF MESH N/A 04/23/2015   Procedure: INSERTION OF MESH;  Surgeon: Morene Olives, MD;  Location: [facility];  Service: General;  Laterality: N/A;   JOINT REPLACEMENT  05/2021   left hip   KYPHOPLASTY N/A 08/19/2021   Procedure: KYPHOPLASTY THORACIC TWELVE, LUMBAR ONE;  Surgeon: Mavis Purchase, MD;  Location: [facility];  Service: Neurosurgery;  Laterality: N/A;  KYPHOPLASTY THORACIC TWELVE, LUMBAR  ONE   TOTAL HIP ARTHROPLASTY Left 2022   [facility]; Per new patient form   TUBAL LIGATION  1972    Allergies  Allergen Reactions   Penicillins Anaphylaxis   Codeine     Makes pt overly excited.... Anxious/ hyperactive    Nsaids     Burns stomach    Clindamycin /Lincomycin Rash    Outpatient Encounter Medications as of 07/09/2024  Medication Sig   acetaminophen  (TYLENOL ) 650 MG CR tablet Take 650 mg by mouth 2 (two) times daily as needed for pain. 1,300mg  BID, oral, Twice a day - PRN, BiD PRN mild pain, may self administer   albuterol  (VENTOLIN  HFA) 108 (90 Base) MCG/ACT inhaler Inhale 2 puffs into the lungs every 6 (six) hours as needed for wheezing or shortness of breath.   AMLODIPINE  BESYLATE PO Take 2.5 mg by mouth every morning. And 2 tablets (5 mg ) in the evening   Calcium  Carb-Cholecalciferol (CALCIUM  600 + D PO) Take 1 tablet by mouth daily.   Carboxymethylcellulose Sodium (LUBRICANT EYE DROPS OP) Apply 1 drop to eye as needed.   celecoxib  (CELEBREX ) 100 MG capsule Take 1 capsule (100 mg total) by mouth 2 (two) times daily as needed.   CHLORPHENIRAMINE-DM-GG PO Take 1 tablet by mouth every 12 (twelve) hours as needed.  Give 1 tablet by mouth every 12 hours as needed for cough/congestion unsupervised self-administration follow instructions on the box, may self-administer   clonazePAM  (KLONOPIN ) 0.5 MG tablet Take 1 tablet (0.5 mg total) by mouth 2 (two) times daily as needed for anxiety.   Cobalamin Combinations (COBALAMINE COMBINATIONS PO) Take 3,000 mcg by mouth daily. In the afternoon for polyarthritis   cyanocobalamin 100 MCG tablet Take 100 mcg by mouth daily. 3000 mcg, oral   dextromethorphan-guaiFENesin (MUCINEX DM) 30-600 MG 12hr tablet Take 1 tablet by mouth 2 (two) times daily.   DULoxetine  (CYMBALTA ) 60 MG capsule TAKE 1 CAPSULE BY MOUTH EVERY DAY   gabapentin  (NEURONTIN ) 300 MG capsule Take 1 capsule (300 mg total) by mouth 4 (four) times  daily. And 1 additional capsule for as needed for pain each day   hyoscyamine  (LEVSIN  SL) 0.125 MG SL tablet Place 0.125 mg under the tongue every 4 (four) hours as needed for cramping.   levothyroxine  (SYNTHROID ) 50 MCG tablet Take 1 tablet (50 mcg total) by mouth daily before breakfast.   lidocaine  4 % Place 1 patch onto the skin every 12 (twelve) hours as needed.   loratadine  (CLARITIN ) 10 MG tablet Take 1 tablet (10 mg total) by mouth daily.   Menthol, Topical Analgesic, (BIOFREEZE PROFESSIONAL) 5 % GEL Apply topically. Small amount, topical may self administer roll on or ask for assister roll on or ask for assistance from  staff. Roll on to right hip or back   Multiple Vitamins-Minerals (PRESERVISION AREDS 2+MULTI VIT PO) Take 1 capsule by mouth daily.   omeprazole  (PRILOSEC) 20 MG capsule Take 1 capsule (20 mg total) by mouth daily.   polyethylene glycol (MIRALAX / GLYCOLAX) 17 g packet Take 17 g by mouth daily as needed.   Probiotic Product (PROBIOTIC PO) Take 1 capsule by mouth every morning.   rosuvastatin  (CRESTOR ) 5 MG tablet TAKE 1 TABLET BY MOUTH EVERY DAY   tiZANidine  (ZANAFLEX ) 4 MG tablet TAKE 0.5 TABLETS (2 MG TOTAL) BY MOUTH 2 (TWO) TIMES DAILY AS NEEDED FOR MUSCLE SPASMS.   traMADol  (ULTRAM ) 50 MG tablet Take 1 tablet (50 mg total) by mouth every 12 (twelve) hours as needed.   triamcinolone  cream (KENALOG) 0.1 % Apply 1 Application topically every 12 (twelve) hours as needed.   triamterene -hydrochlorothiazide  (DYAZIDE ) 37.5-25 MG capsule TAKE 1 CAPSULE BY MOUTH EVERY MORNING   lidocaine -prilocaine  (EMLA ) cream Apply to affected area(s) up to four times daily. (Patient not taking: Reported on 07/09/2024)   phenazopyridine  (PYRIDIUM ) 100 MG tablet Take 1 tablet (100 mg total) by mouth 3 (three) times daily as needed for pain. (Patient not taking: Reported on 07/09/2024)   No facility-administered encounter medications on file as of 07/09/2024.    Review of Systems  Constitutional:   Negative for activity change, appetite change, chills, diaphoresis, fatigue, fever and unexpected weight change.  Skin:  Positive for rash.    Immunization History  Administered Date(s) Administered   Fluad Quad(high Dose 65+) 05/07/2022   INFLUENZA, HIGH DOSE SEASONAL PF 04/19/2019, 04/18/2020, 06/17/2021   Influenza-Unspecified 04/19/2019, 04/18/2020, 06/17/2021, 05/24/2023, 05/04/2024   Moderna Covid-19 Vaccine Bivalent Booster 61yrs & up 08/16/2019, 09/12/2019, 06/29/2021   Moderna Sars-Covid-2 Vaccination 06/17/2020, 03/09/2021, 08/06/2021, 12/17/2021   Novel Infuenza-h1n1-09 07/09/2008   Pneumococcal Conjugate-13 12/20/2013   Pneumococcal Polysaccharide-23 08/17/2012   Tdap 08/16/2022   Tetanus 08/16/2022   Unspecified SARS-COV-2 Vaccination 05/24/2023   Zoster Recombinant(Shingrix) 01/30/2014, 12/18/2020, 04/18/2021   Pertinent  Health Maintenance Due  Topic Date Due   Influenza Vaccine  Completed   Bone Density Scan  Completed      02/02/2023   11:44 AM 03/22/2023    8:19 AM 05/31/2023   11:04 AM 09/13/2023    2:46 PM 04/04/2024   11:00 AM  Fall Risk  Falls in the past year? 1 0 0 0 0  Was there an injury with Fall? 0  0  0  0  0   Fall Risk Category Calculator 2 0 0 0 0  Patient at Risk for Falls Due to History of fall(s);Impaired balance/gait;Impaired mobility History of fall(s);Impaired balance/gait;Impaired mobility No Fall Risks No Fall Risks History of fall(s)  Fall risk Follow up Falls evaluation completed;Education provided;Falls prevention discussed Falls evaluation completed Falls evaluation completed Falls evaluation completed Falls evaluation completed     Data saved with a previous flowsheet row definition   Functional Status Survey:    Vitals:   07/09/24 0901  BP: (!) 147/76  Pulse: 80  Resp: 17  Temp: (!) 97 F (36.1 C)  SpO2: 96%  Weight: 172 lb 6.4 oz (78.2 kg)  Height: 5' 2 (1.575 m)   Body mass index is 31.53 kg/m. Physical Exam Vitals  reviewed.  Constitutional:      Appearance: Normal appearance.  Skin:    General: Skin is warm and dry.     Findings: Erythema and rash (circular scaly erythematous rash to the left upper back and  left breast fold) present.  Neurological:     Mental Status: She is alert.     Labs reviewed: Recent Labs    08/18/23 0000 04/01/24 2250 04/10/24 0000  NA 134* 127* 133*  K 4.2 4.1 4.3  CL 97* 92* 96*  CO2 27* 23 24*  GLUCOSE  --  112*  --   BUN 17 16 16   CREATININE 0.9 1.08* 0.9  CALCIUM  8.8 9.3 9.3   Recent Labs    08/18/23 0000 04/01/24 2250 04/10/24 0000  AST 15 20  --   ALT 14 20  --   ALKPHOS 109 79  --   BILITOT  --  0.9  --   PROT  --  6.6  --   ALBUMIN 3.9 4.1 3.9   Recent Labs    08/18/23 0000 04/01/24 2250  WBC 6.2 6.3  NEUTROABS  --  4.4  HGB 12.3 13.7  HCT 35* 39.3  MCV  --  92.9  PLT 295 241   Lab Results  Component Value Date   TSH 0.59 12/08/2023   No results found for: HGBA1C Lab Results  Component Value Date   CHOL 196 12/08/2023   HDL 58 12/08/2023   LDLCALC 99 12/08/2023   TRIG 198 (A) 12/08/2023    Significant Diagnostic Results in last 30 days:  No results found.  Assessment/Plan   1. Tinea corporis (Primary)  - clotrimazole  (LOTRIMIN ) 1 % cream; Apply 1 Application topically 2 (two) times daily. Apply for two months or until resolved.   Bari America, ANP [facility] [phone]  Total time :  time greater than 50% of total time spent doing pt counseling and coordination of care

## 2024-07-10 ENCOUNTER — Encounter: Payer: Self-pay | Admitting: Adult Health

## 2024-07-10 MED ORDER — CLOTRIMAZOLE 1 % EX CREA: 1.0000 | TOPICAL_CREAM | Freq: Two times a day (BID) | CUTANEOUS | Status: AC

## 2024-07-23 ENCOUNTER — Telehealth: Payer: Self-pay | Admitting: Orthopaedic Surgery

## 2024-07-23 NOTE — Telephone Encounter (Signed)
 Pt called to remind April not to forget to submit for her gel injection in Jan. Pt number is [phone].

## 2024-07-24 NOTE — Telephone Encounter (Signed)
 Noted

## 2024-07-31 LAB — BASIC METABOLIC PANEL WITH GFR
BUN: 16 (ref 4–21)
CO2: 23 — AB (ref 13–22)
Chloride: 97 — AB (ref 99–108)
Creatinine: 1.1 (ref 0.5–1.1)
Glucose: 98
Potassium: 4.4 meq/L (ref 3.5–5.1)
Sodium: 134 — AB (ref 137–147)

## 2024-07-31 LAB — CBC: RBC: 3.79 — AB (ref 3.87–5.11)

## 2024-07-31 LAB — CBC AND DIFFERENTIAL
HCT: 37 (ref 36–46)
Hemoglobin: 12.6 (ref 12.0–16.0)
Platelets: 279 K/uL (ref 150–400)
WBC: 6

## 2024-07-31 LAB — VITAMIN D 25 HYDROXY (VIT D DEFICIENCY, FRACTURES): Vit D, 25-Hydroxy: 49.7

## 2024-07-31 LAB — LIPID PANEL
Cholesterol: 171 (ref 0–200)
HDL: 62 (ref 35–70)
LDL Cholesterol: 88
LDl/HDL Ratio: 2.8
Triglycerides: 106 (ref 40–160)

## 2024-07-31 LAB — HEPATIC FUNCTION PANEL
ALT: 15 U/L (ref 7–35)
AST: 17 (ref 13–35)
Alkaline Phosphatase: 88 (ref 25–125)
Bilirubin, Total: 0.2

## 2024-07-31 LAB — COMPREHENSIVE METABOLIC PANEL WITH GFR
Albumin: 4 (ref 3.5–5.0)
Calcium: 9.1 (ref 8.7–10.7)
Globulin: 1.8
eGFR: 51

## 2024-08-06 ENCOUNTER — Encounter: Payer: Self-pay | Admitting: Adult Health

## 2024-08-06 ENCOUNTER — Non-Acute Institutional Stay: Payer: Self-pay | Admitting: Adult Health

## 2024-08-06 ENCOUNTER — Other Ambulatory Visit: Payer: Self-pay | Admitting: Adult Health

## 2024-08-06 ENCOUNTER — Telehealth: Payer: Self-pay | Admitting: *Deleted

## 2024-08-06 VITALS — BP 120/80 | HR 97 | Temp 97.9°F | Ht 62.0 in | Wt 173.6 lb

## 2024-08-06 DIAGNOSIS — B0229 Other postherpetic nervous system involvement: Secondary | ICD-10-CM | POA: Diagnosis not present

## 2024-08-06 DIAGNOSIS — E039 Hypothyroidism, unspecified: Secondary | ICD-10-CM | POA: Diagnosis not present

## 2024-08-06 DIAGNOSIS — E7800 Pure hypercholesterolemia, unspecified: Secondary | ICD-10-CM | POA: Diagnosis not present

## 2024-08-06 DIAGNOSIS — I10 Essential (primary) hypertension: Secondary | ICD-10-CM | POA: Diagnosis not present

## 2024-08-06 DIAGNOSIS — G8929 Other chronic pain: Secondary | ICD-10-CM | POA: Diagnosis not present

## 2024-08-06 DIAGNOSIS — M545 Low back pain, unspecified: Secondary | ICD-10-CM | POA: Diagnosis not present

## 2024-08-06 DIAGNOSIS — M25561 Pain in right knee: Secondary | ICD-10-CM | POA: Diagnosis not present

## 2024-08-06 MED ORDER — AMLODIPINE BESYLATE 5 MG PO TABS: 2.5000 mg | ORAL_TABLET | Freq: Every morning | ORAL | 5 refills | Status: AC

## 2024-08-06 MED ORDER — AMLODIPINE BESYLATE 5 MG PO TABS: 2.5000 mg | ORAL_TABLET | Freq: Every morning | ORAL | 5 refills | Status: DC

## 2024-08-06 NOTE — Telephone Encounter (Signed)
 Copied from CRM [id number]. Topic: Clinical - Prescription Issue >> Aug 06, 2024  3:14 PM Graeme ORN wrote: Reason for CRM: Pharmacy called. States they received a Rx for amLODipine  (NORVASC ) 5 MG tablet. Rx received is over the recommended daily dose. They need to confirm and have a reason. Thank You

## 2024-08-06 NOTE — Progress Notes (Unsigned)
 "  Location:  [facility]  POS: Clinic  Provider: Tawni Nash, ANP  Code Status: Full Goals of Care:     04/01/2024   10:41 PM  Advanced Directives  Does Patient Have a Medical Advance Directive? No     Chief Complaint  Patient presents with   Follow-up    Patient like discuss nerve pain in her face. Patient would like you to look at her ringworm on her back. Patient would like a refill on duloxteine andtriamterene HCT    HPI: Discussed the use of AI scribe software for clinical note transcription with the patient, who gave verbal consent to proceed.  History of Present Illness Sophia Nash is an 89 year old female with postherpetic neuralgia and osteoarthritis who presents for routine f/u  Neuropathic facial pain (postherpetic neuralgia) - Persistent nerve pain on the left side of the face since shingles outbreak four months ago - Rash has resolved, but pain remains - Gabapentin  taken three to four pills daily with partial relief  Right knee pain (osteoarthritis) - Chronic right knee pain due to osteoarthritis confirmed by x-ray and MRI in January of previous year - Previously received gel injections - Pain sometimes disrupts sleep - Uses a walker in her room and a chair for longer distances - Concerned about maintaining independence  Chronic back pain -hx of radiofrequency procedure and kyphoplasty  - Chronic back pain managed with  injections every six months and tramadol    Dermatologic infection (tinea) - Ringworm infection treated with clotrimazole  (Lotrimin ) twice daily for five weeks - no further symptoms, wants me to eval the area  Hypertension and hyperlipidemia - Blood pressure well controlled on amlodipine  and dyazide  - Crestor  used for cholesterol management  Mood and chronic pain management - Duloxetine  used for mood and chronic pain  Cardiac arrhythmia and murmur - History of elevated pulse, most recently 101 bpm - History of irregular  heartbeat and murmur diagnosed approximately ten years ago  Ophthalmologic disease (macular degeneration) - Macular degeneration managed by eye specialist with biannual visits  Immunization status - Received influenza vaccine this year - Has not received COVID-19 vaccine  Recent laboratory findings - Recent blood work showed normal glucose, kidney and liver function, vitamin D , cholesterol, and thyroid  levels  Supplement use - Takes vitamin D  and calcium  supplements    Mammograms: 05/24/23 neg Bone density 04/16/2022 -1.2 T score  Past Medical History:  Diagnosis Date   Allergy    Anemia    Anginal pain    Anxiety    Arthritis    Atypical moles    Breast lump    left   Chronic kidney disease    Colon polyp    Depression    Diverticulitis    04-15-15 no recent issues   Dysrhythmia 01/2020   Edema    legs, hands  bilateral   GERD (gastroesophageal reflux disease)    hx of    Heart murmur 01/2020   High cholesterol    Per new patient form   Hypertension    Hypothyroidism    IBS (irritable bowel syndrome)    Per new patient form   Impaired hearing    bilaterally some hearing loss-no hearing aids   Lactose intolerance    Per new patient form   Macular degeneration    Per new patient form   Mild cognitive impairment with memory loss 08/02/2000   per Dr Leigh, neuro, 04-15-15 no changes-stable   Recurrent upper respiratory infection (URI)  cold with sinus drainage x several months- saw PCP 10/08/11- chest x ray report on chart.  no fever,resolved, no recent issues as of 04-15-15.   Shoulder joint pain    04-07-15 tripped-loss balance, fell awkwardly and wrenched shoulder-left, Known bad right shoulderbone on bone   Skin lesion of breast    hx of right lateral breast  lesion-tx. with multipe rounds oral antibiotic- resolved, no reoccurrence.   Sleep apnea    Thyroid  disease    hypothyroidism    Past Surgical History:  Procedure Laterality Date   BREAST  SURGERY     lumpectomy- left-benign   CATARACT EXTRACTION, BILATERAL Bilateral    CHOLECYSTECTOMY     laparoscopic   COLECTOMY  10/20/2011   sigmoid colectomy   COLONOSCOPY  2018   DG  BONE DENSITY ([facility] HX)  2021   Sophia Gander, PA; [facility]/ Per new patient form   ESOPHAGOGASTRODUODENOSCOPY     EYE SURGERY     bilateral cataracts   HERNIA REPAIR     INCISIONAL HERNIA REPAIR N/A 01/12/2013   Procedure: LAPAROSCOPIC/OPEN REPAIR INCISIONAL HERNIA;  Surgeon: Sophia Sophia Olives, MD;  Location: [facility];  Service: General;  Laterality: N/A;  With Mesh   INCISIONAL HERNIA REPAIR N/A 04/23/2015   Procedure: LAPAROSCOPIC ASSISTED INCISIONAL VENTRAL HERNIA REPAIR;  Surgeon: Sophia Olives, MD;  Location: [facility];  Service: General;  Laterality: N/A;   INSERTION OF MESH N/A 04/23/2015   Procedure: INSERTION OF MESH;  Surgeon: Sophia Olives, MD;  Location: [facility];  Service: General;  Laterality: N/A;   JOINT REPLACEMENT  05/2021   left hip   KYPHOPLASTY N/A 08/19/2021   Procedure: KYPHOPLASTY THORACIC TWELVE, LUMBAR ONE;  Surgeon: Sophia Purchase, MD;  Location: [facility];  Service: Neurosurgery;  Laterality: N/A;  KYPHOPLASTY THORACIC TWELVE, LUMBAR ONE   TOTAL HIP ARTHROPLASTY Left 2022   [facility], [facility]; Per new patient form   TUBAL LIGATION  1972    Allergies[1]  Outpatient Encounter Medications as of 08/06/2024  Medication Sig   acetaminophen  (TYLENOL ) 650 MG CR tablet Take 650 mg by mouth 2 (two) times daily as needed for pain. 1,300mg  BID, oral, Twice a day - PRN, BiD PRN mild pain, may self administer   albuterol  (VENTOLIN  HFA) 108 (90 Base) MCG/ACT inhaler Inhale 2 puffs into the lungs every 6 (six) hours as needed for wheezing or shortness of breath.   AMLODIPINE  BESYLATE PO Take 2.5 mg by mouth every morning. And 2 tablets (5 mg ) in the evening   Calcium  Carb-Cholecalciferol (CALCIUM  600 + D PO) Take 1 tablet by mouth daily.   Carboxymethylcellulose  Sodium (LUBRICANT EYE DROPS OP) Apply 1 drop to eye as needed.   celecoxib  (CELEBREX ) 100 MG capsule Take 1 capsule (100 mg total) by mouth 2 (two) times daily as needed.   CHLORPHENIRAMINE-DM-GG PO Take 1 tablet by mouth every 12 (twelve) hours as needed.  Give 1 tablet by mouth every 12 hours as needed for cough/congestion unsupervised self-administration follow instructions on the box, may self-administer   clonazePAM  (KLONOPIN ) 0.5 MG tablet Take 1 tablet (0.5 mg total) by mouth 2 (two) times daily as needed for anxiety.   clotrimazole  (LOTRIMIN ) 1 % cream Apply 1 Application topically 2 (two) times daily.   Cobalamin Combinations (COBALAMINE COMBINATIONS PO) Take 3,000 mcg by mouth daily. In the afternoon for polyarthritis   cyanocobalamin 100 MCG tablet Take 100 mcg by mouth daily. 3000 mcg, oral   dextromethorphan-guaiFENesin (MUCINEX DM) 30-600  MG 12hr tablet Take 1 tablet by mouth 2 (two) times daily.   DULoxetine  (CYMBALTA ) 60 MG capsule TAKE 1 CAPSULE BY MOUTH EVERY DAY   gabapentin  (NEURONTIN ) 300 MG capsule Take 1 capsule (300 mg total) by mouth 4 (four) times daily. And 1 additional capsule for as needed for pain each day   hyoscyamine  (LEVSIN  SL) 0.125 MG SL tablet Place 0.125 mg under the tongue every 4 (four) hours as needed for cramping.   levothyroxine  (SYNTHROID ) 50 MCG tablet Take 1 tablet (50 mcg total) by mouth daily before breakfast.   lidocaine  4 % Place 1 patch onto the skin every 12 (twelve) hours as needed.   loratadine  (CLARITIN ) 10 MG tablet Take 1 tablet (10 mg total) by mouth daily.   Menthol, Topical Analgesic, (BIOFREEZE PROFESSIONAL) 5 % GEL Apply topically. Small amount, topical may self administer roll on or ask for assister roll on or ask for assistance from staff. Roll on to right hip or back   Multiple Vitamins-Minerals (PRESERVISION AREDS 2+MULTI VIT PO) Take 1 capsule by mouth daily.   omeprazole  (PRILOSEC) 20 MG capsule Take 1 capsule (20 mg total) by  mouth daily.   polyethylene glycol (MIRALAX / GLYCOLAX) 17 g packet Take 17 g by mouth daily as needed.   Probiotic Product (PROBIOTIC PO) Take 1 capsule by mouth every morning.   rosuvastatin  (CRESTOR ) 5 MG tablet TAKE 1 TABLET BY MOUTH EVERY DAY   tiZANidine  (ZANAFLEX ) 4 MG tablet TAKE 0.5 TABLETS (2 MG TOTAL) BY MOUTH 2 (TWO) TIMES DAILY AS NEEDED FOR MUSCLE SPASMS.   traMADol  (ULTRAM ) 50 MG tablet Take 1 tablet (50 mg total) by mouth every 12 (twelve) hours as needed.   triamcinolone  cream (KENALOG) 0.1 % Apply 1 Application topically every 12 (twelve) hours as needed.   triamterene -hydrochlorothiazide  (DYAZIDE ) 37.5-25 MG capsule TAKE 1 CAPSULE BY MOUTH EVERY MORNING   [DISCONTINUED] lidocaine -prilocaine  (EMLA ) cream Apply to affected area(s) up to four times daily. (Patient not taking: Reported on 07/30/2024)   [DISCONTINUED] phenazopyridine  (PYRIDIUM ) 100 MG tablet Take 1 tablet (100 mg total) by mouth 3 (three) times daily as needed for pain. (Patient not taking: Reported on 07/30/2024)   No facility-administered encounter medications on file as of 08/06/2024.    Review of Systems:  Review of Systems  Constitutional:  Negative for activity change, appetite change, chills, diaphoresis, fatigue and fever.  HENT:  Negative for congestion.   Respiratory:  Negative for cough, shortness of breath and wheezing.   Cardiovascular:  Negative for chest pain and leg swelling.  Gastrointestinal:  Negative for abdominal distention, abdominal pain, constipation, diarrhea, nausea and vomiting.  Genitourinary:  Negative for difficulty urinating, dysuria and urgency.  Musculoskeletal:  Positive for arthralgias, back pain and gait problem. Negative for myalgias and neck pain.  Skin:  Negative for rash.  Neurological:  Negative for dizziness and weakness.  Psychiatric/Behavioral:  Negative for confusion.     Health Maintenance  Topic Date Due   Medicare Annual Wellness (AWV)  01/31/2024   COVID-19  Vaccine (9 - 2025-26 season) 04/02/2024   DTaP/Tdap/Td (2 - Td or Tdap) 08/16/2032   Pneumococcal Vaccine: 50+ Years  Completed   Influenza Vaccine  Completed   Bone Density Scan  Completed   Zoster Vaccines- Shingrix  Completed   Meningococcal B Vaccine  Aged Out    Physical Exam: Vitals:   08/06/24 0832  BP: 120/80  Pulse: 97  Temp: 97.9 F (36.6 C)  SpO2: 96%  Weight: 173 lb  9.6 oz (78.7 kg)  Height: 5' 2 (1.575 m)   Body mass index is 31.75 kg/m. Wt Readings from Last 3 Encounters:  08/06/24 173 lb 9.6 oz (78.7 kg)  07/09/24 172 lb 6.4 oz (78.2 kg)  04/17/24 168 lb (76.2 kg)    Physical Exam Vitals and nursing note reviewed.  Constitutional:      General: She is not in acute distress.    Appearance: She is not diaphoretic.  HENT:     Head: Normocephalic and atraumatic.     Right Ear: Tympanic membrane and ear canal normal.     Left Ear: Tympanic membrane and ear canal normal.     Nose: Nose normal.     Mouth/Throat:     Mouth: Mucous membranes are moist.     Pharynx: Oropharynx is clear.  Neck:     Vascular: No JVD.  Cardiovascular:     Rate and Rhythm: Normal rate.     Heart sounds: Murmur heard.     Comments: Skipped beats Pulmonary:     Effort: Pulmonary effort is normal. No respiratory distress.     Breath sounds: Normal breath sounds. No wheezing.  Abdominal:     General: Abdomen is flat. Bowel sounds are normal. There is no distension.     Palpations: Abdomen is soft.     Tenderness: There is no abdominal tenderness.  Musculoskeletal:     Right lower leg: No edema.     Left lower leg: No edema.  Skin:    General: Skin is warm and dry.  Neurological:     General: No focal deficit present.     Mental Status: She is alert and oriented to person, place, and time. Mental status is at baseline.  Psychiatric:        Mood and Affect: Mood normal.     Labs reviewed: Basic Metabolic Panel: Recent Labs    12/08/23 0000 04/01/24 2250  04/10/24 0000 07/31/24 0000  NA  --  127* 133* 134*  K  --  4.1 4.3 4.4  CL  --  92* 96* 97*  CO2  --  23 24* 23*  GLUCOSE  --  112*  --   --   BUN  --  16 16 16   CREATININE  --  1.08* 0.9 1.1  CALCIUM   --  9.3 9.3 9.1  TSH 0.59  --   --   --    Liver Function Tests: Recent Labs    08/18/23 0000 04/01/24 2250 04/10/24 0000 07/31/24 0000  AST 15 20  --  17  ALT 14 20  --  15  ALKPHOS 109 79  --  88  BILITOT  --  0.9  --   --   PROT  --  6.6  --   --   ALBUMIN 3.9 4.1 3.9 4.0   No results for input(s): LIPASE, AMYLASE in the last 8760 hours. No results for input(s): AMMONIA in the last 8760 hours. CBC: Recent Labs    08/18/23 0000 04/01/24 2250 07/31/24 0000  WBC 6.2 6.3 6.0  NEUTROABS  --  4.4  --   HGB 12.3 13.7 12.6  HCT 35* 39.3 37  MCV  --  92.9  --   PLT 295 241 279   Lipid Panel: Recent Labs    08/18/23 0000 12/08/23 0000 07/31/24 0000  CHOL 179 196 171  HDL 57 58 62  LDLCALC 92 99 88  TRIG 152 198* 106   No results  found for: HGBA1C  Procedures since last visit: No results found.  Assessment/Plan Assessment and Plan Assessment & Plan Postherpetic neuralgia Persistent nerve pain managed effectively with gabapentin . - Continue gabapentin  as long as no side effects are present.  Primary osteoarthritis of the right knee Severe osteoarthritis with bone-on-bone contact. Gel injections preferred over surgery due to age and chronic conditions. - Continue with gel injections as scheduled. - Reassess knee pain and function in three months with Doctor Charlanne.  Chronic low back pain Managed with regular injections and tramadol  - Continue current management with regular injections.  Chronic kidney disease stage 3a GFR of 51, consistent with age-related changes. BUN and creatinine levels normal. - Continue monitoring kidney function as part of routine care.  Hypertension Blood pressure well-controlled with current medication. - Continue  current antihypertensive medications.  Hyperlipidemia Cholesterol levels well-controlled with current medication. LDL 88, total cholesterol 828. - Continue current statin therapy.  Hypothyroidism TSH level normal in May. - Continue current levothyroxine  therapy.  Cardiac arrhythmia with murmur Skipped heartbeat with murmur. Previous EKG normal. - Continue to monitor cardiac status and consider further evaluation if symptoms worsen. -if considering knee surgery would recommend cardiac clearance.   General health maintenance Routine health maintenance discussed. Flu vaccine received, COVID vaccine not received this year. She declined  - Continue routine health maintenance and screenings.      Labs/tests ordered:  * No order type specified * Next appt:  3months   Total time :  time greater than 50% of total time spent doing pt counseling and coordination of care         [1]  Allergies Allergen Reactions   Penicillins Anaphylaxis   Codeine     Makes pt overly excited.... Anxious/ hyperactive    Nsaids     Burns stomach    Clindamycin /Lincomycin Rash   "

## 2024-08-06 NOTE — Telephone Encounter (Signed)
 Sophia Maus, NP t     08/06/24  4:43 PM I fixed it and sent it again.    Thanks   Bari Darlean NP

## 2024-08-06 NOTE — Telephone Encounter (Signed)
 She takes a 2.5 mg in the morning and 5 mg in the evening for a total of 7.5 mg per day. The max is 10 mg.

## 2024-08-06 NOTE — Telephone Encounter (Signed)
 Spoke with Pharmacist and she stated that the Rx states 2.5mg  in the morning and 2 tablets of the 5mg  in the evening. Equally 10mg  in the evening.   amLODipine  (NORVASC ) 5 MG tablet 5 ordered       2.5 mg, Every morningDirections:Take 0.5 tablets (2.5 mg total) by mouth every morning. And 2 tablets (5 mg ) in the evening       She stated in order for insurance to pay for it anyway 2 separate Rx's need to be written. One for the morning and One for the evening.

## 2024-08-08 DIAGNOSIS — G8929 Other chronic pain: Secondary | ICD-10-CM | POA: Insufficient documentation

## 2024-08-08 DIAGNOSIS — M25561 Pain in right knee: Secondary | ICD-10-CM | POA: Insufficient documentation

## 2024-08-08 DIAGNOSIS — B0229 Other postherpetic nervous system involvement: Secondary | ICD-10-CM | POA: Insufficient documentation

## 2024-08-09 ENCOUNTER — Telehealth: Payer: Self-pay | Admitting: Orthopaedic Surgery

## 2024-08-09 NOTE — Telephone Encounter (Signed)
 VOB has been submitted.  Will call patient to schedule appt.after 08/29/2024.

## 2024-08-09 NOTE — Telephone Encounter (Signed)
 Pt called asking for submission of gel injection right knee. Please call pt at [phone].

## 2024-08-21 ENCOUNTER — Other Ambulatory Visit: Payer: Self-pay | Admitting: Internal Medicine

## 2024-08-22 NOTE — Telephone Encounter (Signed)
 Please advise. Not showing medication prescribed by you.

## 2024-08-23 ENCOUNTER — Other Ambulatory Visit: Payer: Self-pay | Admitting: Adult Health

## 2024-08-23 DIAGNOSIS — B0229 Other postherpetic nervous system involvement: Secondary | ICD-10-CM

## 2024-08-23 NOTE — Telephone Encounter (Signed)
Please advise of refill

## 2024-09-03 ENCOUNTER — Other Ambulatory Visit: Payer: Self-pay | Admitting: Internal Medicine

## 2024-09-03 DIAGNOSIS — F339 Major depressive disorder, recurrent, unspecified: Secondary | ICD-10-CM

## 2024-09-04 NOTE — Telephone Encounter (Signed)
 High risk warning populated when attempting to refill medications, will send to provider for pending  review and approval

## 2024-09-05 ENCOUNTER — Ambulatory Visit: Admitting: Orthopaedic Surgery

## 2024-09-05 ENCOUNTER — Encounter: Payer: Self-pay | Admitting: Orthopaedic Surgery

## 2024-09-05 DIAGNOSIS — M1711 Unilateral primary osteoarthritis, right knee: Secondary | ICD-10-CM | POA: Diagnosis not present

## 2024-09-05 MED ORDER — SODIUM HYALURONATE 60 MG/3ML IX PRSY
60.0000 mg | PREFILLED_SYRINGE | INTRA_ARTICULAR | Status: AC | PRN
  Administered 2024-09-05: 60 mg via INTRA_ARTICULAR

## 2024-09-05 NOTE — Progress Notes (Signed)
" ° °  Procedure Note  Patient: Sophia Nash             Date of Birth: September 01, 1934           MRN: [id number]             Visit Date: 09/05/2024  Procedures: Visit Diagnoses:  1. Unilateral primary osteoarthritis, right knee     Large Joint Inj: R knee on 09/05/2024 3:31 PM Indications: diagnostic evaluation and pain Details: 22 G 1.5 in needle, superolateral approach  Arthrogram: No  Medications: 60 mg Sodium Hyaluronate 60 MG/3ML Outcome: tolerated well, no immediate complications Procedure, treatment alternatives, risks and benefits explained, specific risks discussed. Consent was given by the patient. Immediately prior to procedure a time out was called to verify the correct patient, procedure, equipment, support staff and site/side marked as required. Patient was prepped and draped in the usual sterile fashion.    The patient is well-known to us .  She comes in about every 6 months for hyaluronic acid injections in her knee on the right side to treat the pain from osteoarthritis.  She is here for a Durolane injection today.  She has had no acute change in her medical status.  She is active 89 year old female.  We have placed her injections in the past as well.  She did have a moderate effusion I did aspirate about 50 cc of fluid off of her right knee and then placed the hyaluronic acid injection in her right knee.  Will see her back in 3 months to see how she is doing overall and to likely place a steroid injection in her knee at that visit.   Lot #[id number]    "

## 2024-11-06 ENCOUNTER — Encounter: Admitting: Internal Medicine
